# Patient Record
Sex: Male | Born: 1985 | Race: Black or African American | Hispanic: No | Marital: Single | Smoking: Former smoker
Health system: Southern US, Community
[De-identification: ages and names within clinical notes are randomized; demographics above are authoritative.]

## PROBLEM LIST (undated history)

## (undated) DIAGNOSIS — F32A Depression, unspecified: Secondary | ICD-10-CM

## (undated) DIAGNOSIS — R569 Unspecified convulsions: Secondary | ICD-10-CM

## (undated) DIAGNOSIS — R51 Headache: Secondary | ICD-10-CM

## (undated) DIAGNOSIS — W3400XA Accidental discharge from unspecified firearms or gun, initial encounter: Secondary | ICD-10-CM

## (undated) DIAGNOSIS — F329 Major depressive disorder, single episode, unspecified: Secondary | ICD-10-CM

## (undated) DIAGNOSIS — Z9289 Personal history of other medical treatment: Secondary | ICD-10-CM

## (undated) DIAGNOSIS — I10 Essential (primary) hypertension: Secondary | ICD-10-CM

## (undated) DIAGNOSIS — Z9109 Other allergy status, other than to drugs and biological substances: Secondary | ICD-10-CM

## (undated) DIAGNOSIS — Z8619 Personal history of other infectious and parasitic diseases: Secondary | ICD-10-CM

## (undated) DIAGNOSIS — Z9889 Other specified postprocedural states: Secondary | ICD-10-CM

## (undated) DIAGNOSIS — R519 Headache, unspecified: Secondary | ICD-10-CM

## (undated) DIAGNOSIS — Z8669 Personal history of other diseases of the nervous system and sense organs: Secondary | ICD-10-CM

## (undated) DIAGNOSIS — T7840XA Allergy, unspecified, initial encounter: Secondary | ICD-10-CM

## (undated) HISTORY — DX: Headache, unspecified: R51.9

## (undated) HISTORY — DX: Allergy, unspecified, initial encounter: T78.40XA

## (undated) HISTORY — DX: Headache: R51

## (undated) HISTORY — DX: Essential (primary) hypertension: I10

---
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
## 2010-08-24 ENCOUNTER — Emergency Department (HOSPITAL_BASED_OUTPATIENT_CLINIC_OR_DEPARTMENT_OTHER)
Admission: EM | Admit: 2010-08-24 | Discharge: 2010-08-24 | Payer: Self-pay | Source: Home / Self Care | Admitting: Emergency Medicine

## 2011-07-19 ENCOUNTER — Ambulatory Visit (INDEPENDENT_AMBULATORY_CARE_PROVIDER_SITE_OTHER): Payer: Managed Care, Other (non HMO)

## 2011-07-19 DIAGNOSIS — R509 Fever, unspecified: Secondary | ICD-10-CM

## 2011-07-19 DIAGNOSIS — J111 Influenza due to unidentified influenza virus with other respiratory manifestations: Secondary | ICD-10-CM

## 2011-12-02 ENCOUNTER — Emergency Department (INDEPENDENT_AMBULATORY_CARE_PROVIDER_SITE_OTHER)

## 2011-12-02 ENCOUNTER — Emergency Department (HOSPITAL_BASED_OUTPATIENT_CLINIC_OR_DEPARTMENT_OTHER)
Admission: EM | Admit: 2011-12-02 | Discharge: 2011-12-02 | Disposition: A | Discharge status: Home / Self Care | Attending: Emergency Medicine | Admitting: Emergency Medicine

## 2011-12-02 ENCOUNTER — Encounter (HOSPITAL_BASED_OUTPATIENT_CLINIC_OR_DEPARTMENT_OTHER): Payer: Self-pay | Admitting: *Deleted

## 2011-12-02 DIAGNOSIS — W208XXA Other cause of strike by thrown, projected or falling object, initial encounter: Secondary | ICD-10-CM

## 2011-12-02 DIAGNOSIS — M79609 Pain in unspecified limb: Secondary | ICD-10-CM | POA: Insufficient documentation

## 2011-12-02 DIAGNOSIS — Y9269 Other specified industrial and construction area as the place of occurrence of the external cause: Secondary | ICD-10-CM | POA: Insufficient documentation

## 2011-12-02 DIAGNOSIS — IMO0002 Reserved for concepts with insufficient information to code with codable children: Secondary | ICD-10-CM | POA: Insufficient documentation

## 2011-12-02 DIAGNOSIS — S8990XA Unspecified injury of unspecified lower leg, initial encounter: Secondary | ICD-10-CM

## 2011-12-02 DIAGNOSIS — S9030XA Contusion of unspecified foot, initial encounter: Secondary | ICD-10-CM | POA: Insufficient documentation

## 2011-12-02 MED ORDER — IBUPROFEN 600 MG PO TABS: 600.0000 mg | ORAL_TABLET | Freq: Four times a day (QID) | ORAL | Status: AC | PRN

## 2011-12-02 MED ORDER — IBUPROFEN 400 MG PO TABS
600.0000 mg | ORAL_TABLET | Freq: Once | ORAL | Status: AC
  Administered 2011-12-02: 600 mg via ORAL
  Filled 2011-12-02: qty 1

## 2011-12-02 MED ORDER — TRAMADOL HCL 50 MG PO TABS: 50.0000 mg | ORAL_TABLET | Freq: Four times a day (QID) | ORAL | Status: AC | PRN

## 2011-12-02 NOTE — ED Notes (Signed)
Pt. Did not want a w/c
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
## 2011-12-02 NOTE — ED Provider Notes (Signed)
History     CSN: [id number]  Arrival date & time 12/02/11  0008   First MD Initiated Contact with Patient 12/02/11 0013      Chief Complaint  Patient presents with  . Foot Injury    Pt. reports a metal pole fell on to the R great toe causing injury and pain approx. 1 hr ago    (Consider location/radiation/quality/duration/timing/severity/associated sxs/prior treatment) HPI Pt states metal pole weighing > 50lbs fell onto L great toe 1 hour ago while at work. No other injury. No open injuries. Pt is ambulatory.  History reviewed. No pertinent past medical history.  History reviewed. No pertinent past surgical history.  No family history on file.  History  Substance Use Topics  . Smoking status: Passive Smoker    Types: Cigars  . Smokeless tobacco: Not on file  . Alcohol Use: Yes      Review of Systems  Skin: Positive for wound.  Neurological: Negative for weakness and numbness.    Allergies  Review of patient's allergies indicates not on file.  Home Medications   Current Outpatient Rx  Name Route Sig Dispense Refill  . IBUPROFEN 600 MG PO TABS Oral Take 1 tablet (600 mg total) by mouth every 6 (six) hours as needed for pain. 30 tablet 0  . TRAMADOL HCL 50 MG PO TABS Oral Take 1 tablet (50 mg total) by mouth every 6 (six) hours as needed for pain. 15 tablet 0    BP 119/82  Pulse 60  Temp(Src) 97.6 F (36.4 C) (Oral)  Resp 17  Ht 6' (1.829 m)  Wt 198 lb (89.812 kg)  BMI 26.85 kg/m2  SpO2 100%  Physical Exam  Constitutional: He is oriented to person, place, and time. He appears well-developed and well-nourished.  HENT:  Head: Normocephalic and atraumatic.  Neck: Normal range of motion. Neck supple.  Pulmonary/Chest: Effort normal.  Abdominal: Soft.  Musculoskeletal:       L foot: DP 2+. Sensation/motor intact throughout. TTP over tip of 1 st digit and 2nd digit. No deformity. Contusions noted.   Neurological: He is alert and oriented to person, place,  and time.  Skin: Skin is warm and dry.  Psychiatric: He has a normal mood and affect. His behavior is normal.    ED Course  Procedures (including critical care time)  Labs Reviewed - No data to display No results found.   1. Foot contusion       MDM          Loren Racer, MD 12/02/11 [phone]

## 2011-12-02 NOTE — Discharge Instructions (Signed)
Contusion (Bruise) of Foot Injury to the foot causes bruises (contusions). Contusions are caused by bleeding from small blood vessels that allow blood to leak out into the muscles, cord-like structures that attach muscle to bone (tendons), and/or other soft tissue.  CAUSES  Contusions of the foot are common. Bruises are frequently seen from:  Contact sports injuries.   The use of medications that thin the blood (anti-coagulants).   Aspirin and non-steroidal anti-inflammatory agents that decrease the clotting ability.   People with vitamin deficiencies.  SYMPTOMS  Signs of foot injury include pain and swelling. At first there may be discoloration from blood under the skin. This will appear blue to purple in color. As the bruise ages, the color turns yellow. Swelling may limit the movement of the toes.  Complications from foot injury may include:  Collections of blood leading to disability if calcium deposits form. These can later limit movement in the foot.   Infection of the foot if there are breaks in the skin.   Rupture of the tendons that may need surgical repair.  DIAGNOSIS  Diagnosing foot injuries can be made by observation. If problems continue, X-rays may be needed to make sure there are no broken bones (fractures). Continuing problems may require physical therapy.  HOME CARE INSTRUCTIONS   Apply ice to the injury for 15 to 20 minutes, 3 to 4 times per day. Put the ice in a plastic bag and place a towel between the bag of ice and your skin.   An elastic wrap (like an Ace bandage) may be used to keep swelling down.   Keep foot elevated to reduce swelling and discomfort.   Try to avoid standing or walking while the foot is painful. Do not resume use until instructed by your caregiver. Then begin use gradually. If pain develops, decrease use and continue the above measures. Gradually increase activities that do not cause discomfort until you slowly have normal use.   Only take  over-the-counter or prescription medicines for pain, discomfort, or fever as directed by your caregiver. Use only if your caregiver has not given medications that would interfere.   Begin daily rehabilitation exercises when supportive wrapping is no longer needed.   Use ice massage for 10 minutes before and after workouts. Fill a large styrofoam cup with water and freeze. Tear a small amount of foam from the top so ice protrudes. Massage ice firmly over the injured area in a circle about the size of a softball.   Always eat a well balanced diet.   Follow all instructions for follow up with your caregiver, any orthopedic referrals, physical therapy and rehabilitation. Any delay in obtaining necessary care could result in delayed healing, and temporary or permanent disability.  SEEK IMMEDIATE MEDICAL CARE IF:   Your pain and swelling increase, or pain is uncontrolled with medications.   You have loss of feeling in your foot, or your foot turns cold or blue.   An oral temperature above 102 F (38.9 C) develops, not controlled by medication.   Your foot becomes warm to touch, or you have more pain with movement of your toes.   You have a foot contusion that does not improve in 1 or 2 days.   Skin is broken and signs of infection occur (drainage, increasing pain, fever, headache, muscle aches, dizziness or a general ill feeling).   You develop new, unexplained symptoms, or an increase of the symptoms that brought you to your caregiver.  MAKE SURE YOU:     Understand these instructions.   Will watch your condition.   Will get help right away if you are not doing well or get worse.  Document Released: 05/16/2006 Document Revised: 07/14/2011 Document Reviewed: 06/28/2011 [employer] Patient Information 2012 [employer]. 

## 2012-06-05 ENCOUNTER — Emergency Department (HOSPITAL_BASED_OUTPATIENT_CLINIC_OR_DEPARTMENT_OTHER): Payer: 59

## 2012-06-05 ENCOUNTER — Encounter (HOSPITAL_BASED_OUTPATIENT_CLINIC_OR_DEPARTMENT_OTHER): Payer: Self-pay | Admitting: Emergency Medicine

## 2012-06-05 DIAGNOSIS — S59909A Unspecified injury of unspecified elbow, initial encounter: Secondary | ICD-10-CM | POA: Insufficient documentation

## 2012-06-05 DIAGNOSIS — Y9389 Activity, other specified: Secondary | ICD-10-CM | POA: Insufficient documentation

## 2012-06-05 DIAGNOSIS — S6990XA Unspecified injury of unspecified wrist, hand and finger(s), initial encounter: Secondary | ICD-10-CM | POA: Insufficient documentation

## 2012-06-05 DIAGNOSIS — Y929 Unspecified place or not applicable: Secondary | ICD-10-CM | POA: Insufficient documentation

## 2012-06-05 DIAGNOSIS — W2209XA Striking against other stationary object, initial encounter: Secondary | ICD-10-CM | POA: Insufficient documentation

## 2012-06-05 DIAGNOSIS — Z87891 Personal history of nicotine dependence: Secondary | ICD-10-CM | POA: Insufficient documentation
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
## 2012-06-06 ENCOUNTER — Emergency Department (HOSPITAL_BASED_OUTPATIENT_CLINIC_OR_DEPARTMENT_OTHER)
Admission: EM | Admit: 2012-06-06 | Discharge: 2012-06-06 | Discharge status: Against Medical Advice | Payer: 59 | Attending: Emergency Medicine | Admitting: Emergency Medicine

## 2012-06-06 NOTE — ED Notes (Signed)
Pt called x 2 with no answer  

## 2012-09-14 ENCOUNTER — Emergency Department (HOSPITAL_BASED_OUTPATIENT_CLINIC_OR_DEPARTMENT_OTHER)
Admission: EM | Admit: 2012-09-14 | Discharge: 2012-09-15 | Disposition: A | Discharge status: Home / Self Care | Payer: Self-pay | Attending: Emergency Medicine | Admitting: Emergency Medicine

## 2012-09-14 ENCOUNTER — Encounter (HOSPITAL_BASED_OUTPATIENT_CLINIC_OR_DEPARTMENT_OTHER): Payer: Self-pay

## 2012-09-14 ENCOUNTER — Emergency Department (HOSPITAL_BASED_OUTPATIENT_CLINIC_OR_DEPARTMENT_OTHER): Payer: Self-pay

## 2012-09-14 DIAGNOSIS — R5381 Other malaise: Secondary | ICD-10-CM | POA: Insufficient documentation

## 2012-09-14 DIAGNOSIS — I4902 Ventricular flutter: Secondary | ICD-10-CM | POA: Insufficient documentation

## 2012-09-14 DIAGNOSIS — F17200 Nicotine dependence, unspecified, uncomplicated: Secondary | ICD-10-CM | POA: Insufficient documentation

## 2012-09-14 DIAGNOSIS — R002 Palpitations: Secondary | ICD-10-CM | POA: Insufficient documentation

## 2012-09-14 LAB — CBC WITH DIFFERENTIAL/PLATELET
Basophils Absolute: 0 10*3/uL (ref 0.0–0.1)
Basophils Relative: 0 % (ref 0–1)
Eosinophils Absolute: 0.2 10*3/uL (ref 0.0–0.7)
Eosinophils Relative: 3 % (ref 0–5)
MCH: 28.5 pg (ref 26.0–34.0)
MCHC: 34.4 g/dL (ref 30.0–36.0)
Neutrophils Relative %: 43 % (ref 43–77)
Platelets: 227 10*3/uL (ref 150–400)
RBC: 5.12 MIL/uL (ref 4.22–5.81)
RDW: 12.8 % (ref 11.5–15.5)

## 2012-09-14 NOTE — ED Notes (Signed)
Pt. denies CP, n/v.  States he feels tired.  Has never had sx. like this before.

## 2012-09-14 NOTE — ED Provider Notes (Signed)
History  This chart was scribed for Hanley Seamen, MD by Bennett Scrape, ED Scribe. This patient was seen in room MH09/MH09 and the patient's care was started at 11:28 PM.  CSN: [id number]  Arrival date & time 09/14/12  2244   First MD Initiated Contact with Patient 09/14/12 2324      Chief Complaint  Patient presents with  . Irregular Heart Beat    The history is provided by the patient. No language interpreter was used.    ,Dakota Evans is a 27 y.o. male who presents to the Emergency Department complaining of intermittent episodes of premature ventricular contractions described as "fluttering" that last a few seconds at a time that he noticed 6 days ago. He states that the episodes can be 5 to 20 minutes apart. He denies any known triggers for the symptoms. He states that he is here for evaluation due to the worsening of symptoms and feeling fatigued at work today. He denies having prior episodes of similar symptoms. He denies having CP, SOB, diaphroeis or nausea as associated symptoms. He does not have a h/o chronic medical conditions and is a current everyday smoker and alcohol user.  History reviewed. No pertinent past medical history.  Past Surgical History  Procedure Date  . Dental surgery     No family history on file.  History  Substance Use Topics  . Smoking status: Current Some Day Smoker  . Smokeless tobacco: Not on file  . Alcohol Use: Yes     Comment: socially     Review of Systems  A complete 10 system review of systems was obtained and all systems are negative except as noted in the HPI and PMH.   Allergies  Review of patient's allergies indicates no known allergies.  Home Medications  No current outpatient prescriptions on file.  Triage Vitals: BP 140/97  Pulse 82  Temp 98.3 F (36.8 C) (Oral)  Resp 16  Ht 6' (1.829 m)  Wt 220 lb (99.791 kg)  BMI 29.84 kg/m2  SpO2 100%  Physical Exam  Nursing note and vitals reviewed. Constitutional: He is  oriented to person, place, and time. He appears well-developed and well-nourished. No distress.  HENT:  Head: Normocephalic and atraumatic.  Mouth/Throat: Oropharynx is clear and moist.  Eyes: Conjunctivae normal and EOM are normal. Pupils are equal, round, and reactive to light.  Neck: Normal range of motion. Neck supple. No tracheal deviation present. No thyromegaly present.  Cardiovascular: Normal rate, regular rhythm and intact distal pulses.   Pulmonary/Chest: Effort normal and breath sounds normal. No respiratory distress.  Abdominal: Soft. There is no tenderness.  Musculoskeletal: Normal range of motion. He exhibits no edema.  Neurological: He is alert and oriented to person, place, and time.  Skin: Skin is warm and dry.  Psychiatric: He has a normal mood and affect. His behavior is normal.    ED Course  Procedures (including critical care time)  DIAGNOSTIC STUDIES: Oxygen Saturation is 100% on room air, normal by my interpretation.    COORDINATION OF CARE: 11:28 PM- Advised pt of my diagnosis as premature ventricular contractions and advised him that this is not life threatening and will resolve on its own in time. Discussed treatment plan which includes f/u with cardiologist.     MDM   Nursing notes and vitals signs, including pulse oximetry, reviewed.  Summary of this visit's results, reviewed by myself:  Labs:  Results for orders placed during the hospital encounter of 09/14/12 (from the past  24 hour(s))  CBC WITH DIFFERENTIAL     Status: None   Collection Time    09/14/12 11:33 PM      Result Value Range   WBC 6.4  4.0 - 10.5 K/uL   RBC 5.12  4.22 - 5.81 MIL/uL   Hemoglobin 14.6  13.0 - 17.0 g/dL   HCT 46.9  62.9 - 52.8 %   MCV 83.0  78.0 - 100.0 fL   MCH 28.5  26.0 - 34.0 pg   MCHC 34.4  30.0 - 36.0 g/dL   RDW 41.3  24.4 - 01.0 %   Platelets 227  150 - 400 K/uL   Neutrophils Relative 43  43 - 77 %   Neutro Abs 2.8  1.7 - 7.7 K/uL   Lymphocytes Relative  43  12 - 46 %   Lymphs Abs 2.8  0.7 - 4.0 K/uL   Monocytes Relative 11  3 - 12 %   Monocytes Absolute 0.7  0.1 - 1.0 K/uL   Eosinophils Relative 3  0 - 5 %   Eosinophils Absolute 0.2  0.0 - 0.7 K/uL   Basophils Relative 0  0 - 1 %   Basophils Absolute 0.0  0.0 - 0.1 K/uL  BASIC METABOLIC PANEL     Status: None   Collection Time    09/14/12 11:33 PM      Result Value Range   Sodium 140  135 - 145 mEq/L   Potassium 3.6  3.5 - 5.1 mEq/L   Chloride 100  96 - 112 mEq/L   CO2 29  19 - 32 mEq/L   Glucose, Bld 97  70 - 99 mg/dL   BUN 7  6 - 23 mg/dL   Creatinine, Ser 2.72  0.50 - 1.35 mg/dL   Calcium 9.5  8.4 - 53.6 mg/dL   GFR calc non Af Amer >90  >90 mL/min   GFR calc Af Amer >90  >90 mL/min  TROPONIN I     Status: None   Collection Time    09/14/12 11:33 PM      Result Value Range   Troponin I <0.30  <0.30 ng/mL    Imaging Studies: Dg Chest 2 View  09/14/2012  *RADIOLOGY REPORT*  Clinical Data: Irregular heart beat.  Heart fluttering.  CHEST - 2 VIEW  Comparison: None.  Findings: The heart size and pulmonary vascularity are normal. The lungs appear clear and expanded without focal air space disease or consolidation. No blunting of the costophrenic angles.  No pneumothorax.  Mediastinal contours appear intact.  IMPRESSION: No evidence of active pulmonary disease.   Original Report Authenticated By: Burman Nieves, M.D.       EKG Interpretation:  Date & Time: 09/14/2012 10:54 PM  Rate: 75  Rhythm: normal sinus rhythm  QRS Axis: right  Intervals: normal  ST/T Wave abnormalities: normal  Conduction Disutrbances:none  Narrative Interpretation:   Old EKG Reviewed: none available  History consistent with PVCs but no PVCs were seen during the patient's stay in the ED. Will refer to cardiology for likely Holter monitoring.     I personally performed the services described in this documentation, which was scribed in my presence.  The recorded information has been reviewed is  accurate.     Hanley Seamen, MD 09/15/12 [phone]

## 2012-09-14 NOTE — ED Notes (Signed)
Complains of irregular heart beat.  States "it goes normal then feels like chest is fluttering"  States "feels like he ran around the block"

## 2012-09-14 NOTE — ED Notes (Signed)
MD at bedside. 

## 2012-09-15 LAB — TSH: TSH: 2.721 u[IU]/mL (ref 0.350–4.500)

## 2012-09-15 LAB — BASIC METABOLIC PANEL
Calcium: 9.5 mg/dL (ref 8.4–10.5)
GFR calc Af Amer: >90 mL/min (ref >90)
GFR calc non Af Amer: >90 mL/min (ref >90)
Potassium: 3.6 mEq/L (ref 3.5–5.1)
Sodium: 140 mEq/L (ref 135–145)

## 2013-01-17 ENCOUNTER — Inpatient Hospital Stay (HOSPITAL_COMMUNITY): Payer: 59

## 2013-01-17 ENCOUNTER — Emergency Department (HOSPITAL_COMMUNITY): Payer: 59

## 2013-01-17 ENCOUNTER — Encounter (HOSPITAL_COMMUNITY): Admission: EM | Disposition: A | Discharge status: Rehabilitation Facility | Payer: Self-pay | Source: Home / Self Care

## 2013-01-17 ENCOUNTER — Encounter (HOSPITAL_COMMUNITY): Payer: Self-pay | Admitting: Anesthesiology

## 2013-01-17 ENCOUNTER — Emergency Department (HOSPITAL_COMMUNITY): Payer: 59 | Admitting: Anesthesiology

## 2013-01-17 ENCOUNTER — Encounter (HOSPITAL_COMMUNITY): Payer: Self-pay | Admitting: Radiology

## 2013-01-17 ENCOUNTER — Inpatient Hospital Stay (HOSPITAL_COMMUNITY)
Admission: EM | Admit: 2013-01-17 | Discharge: 2013-02-12 | DRG: 957 | Disposition: A | Discharge status: Rehabilitation Facility | Payer: 59 | Attending: General Surgery | Admitting: General Surgery

## 2013-01-17 DIAGNOSIS — F17200 Nicotine dependence, unspecified, uncomplicated: Secondary | ICD-10-CM | POA: Diagnosis present

## 2013-01-17 DIAGNOSIS — R4701 Aphasia: Secondary | ICD-10-CM | POA: Diagnosis present

## 2013-01-17 DIAGNOSIS — S36499A Other injury of unspecified part of small intestine, initial encounter: Secondary | ICD-10-CM | POA: Diagnosis present

## 2013-01-17 DIAGNOSIS — Y833 Surgical operation with formation of external stoma as the cause of abnormal reaction of the patient, or of later complication, without mention of misadventure at the time of the procedure: Secondary | ICD-10-CM | POA: Diagnosis not present

## 2013-01-17 DIAGNOSIS — S3669XA Other injury of rectum, initial encounter: Secondary | ICD-10-CM | POA: Diagnosis present

## 2013-01-17 DIAGNOSIS — N170 Acute kidney failure with tubular necrosis: Secondary | ICD-10-CM | POA: Diagnosis not present

## 2013-01-17 DIAGNOSIS — D72829 Elevated white blood cell count, unspecified: Secondary | ICD-10-CM | POA: Diagnosis present

## 2013-01-17 DIAGNOSIS — S066X9A Traumatic subarachnoid hemorrhage with loss of consciousness of unspecified duration, initial encounter: Secondary | ICD-10-CM

## 2013-01-17 DIAGNOSIS — S21309A Unspecified open wound of unspecified front wall of thorax with penetration into thoracic cavity, initial encounter: Secondary | ICD-10-CM

## 2013-01-17 DIAGNOSIS — IMO0002 Reserved for concepts with insufficient information to code with codable children: Secondary | ICD-10-CM | POA: Diagnosis not present

## 2013-01-17 DIAGNOSIS — S31139A Puncture wound of abdominal wall without foreign body, unspecified quadrant without penetration into peritoneal cavity, initial encounter: Secondary | ICD-10-CM

## 2013-01-17 DIAGNOSIS — S27809A Unspecified injury of diaphragm, initial encounter: Secondary | ICD-10-CM

## 2013-01-17 DIAGNOSIS — S36116A Major laceration of liver, initial encounter: Secondary | ICD-10-CM

## 2013-01-17 DIAGNOSIS — D62 Acute posthemorrhagic anemia: Secondary | ICD-10-CM | POA: Diagnosis not present

## 2013-01-17 DIAGNOSIS — K661 Hemoperitoneum: Secondary | ICD-10-CM | POA: Diagnosis present

## 2013-01-17 DIAGNOSIS — S066X0A Traumatic subarachnoid hemorrhage without loss of consciousness, initial encounter: Secondary | ICD-10-CM | POA: Diagnosis present

## 2013-01-17 DIAGNOSIS — S36599A Other injury of unspecified part of colon, initial encounter: Secondary | ICD-10-CM | POA: Diagnosis present

## 2013-01-17 DIAGNOSIS — S0193XA Puncture wound without foreign body of unspecified part of head, initial encounter: Secondary | ICD-10-CM

## 2013-01-17 DIAGNOSIS — S065X9A Traumatic subdural hemorrhage with loss of consciousness of unspecified duration, initial encounter: Secondary | ICD-10-CM

## 2013-01-17 DIAGNOSIS — S36529A Contusion of unspecified part of colon, initial encounter: Secondary | ICD-10-CM

## 2013-01-17 DIAGNOSIS — J90 Pleural effusion, not elsewhere classified: Secondary | ICD-10-CM | POA: Diagnosis not present

## 2013-01-17 DIAGNOSIS — A419 Sepsis, unspecified organism: Secondary | ICD-10-CM | POA: Diagnosis not present

## 2013-01-17 DIAGNOSIS — J95821 Acute postprocedural respiratory failure: Secondary | ICD-10-CM | POA: Diagnosis present

## 2013-01-17 DIAGNOSIS — S0100XA Unspecified open wound of scalp, initial encounter: Secondary | ICD-10-CM | POA: Diagnosis present

## 2013-01-17 DIAGNOSIS — K659 Peritonitis, unspecified: Secondary | ICD-10-CM | POA: Diagnosis not present

## 2013-01-17 DIAGNOSIS — W3400XA Accidental discharge from unspecified firearms or gun, initial encounter: Secondary | ICD-10-CM

## 2013-01-17 DIAGNOSIS — S0190XA Unspecified open wound of unspecified part of head, initial encounter: Secondary | ICD-10-CM | POA: Diagnosis present

## 2013-01-17 DIAGNOSIS — T8140XA Infection following a procedure, unspecified, initial encounter: Secondary | ICD-10-CM | POA: Diagnosis not present

## 2013-01-17 DIAGNOSIS — I498 Other specified cardiac arrhythmias: Secondary | ICD-10-CM | POA: Diagnosis not present

## 2013-01-17 DIAGNOSIS — S36119A Unspecified injury of liver, initial encounter: Secondary | ICD-10-CM

## 2013-01-17 DIAGNOSIS — S066XAA Traumatic subarachnoid hemorrhage with loss of consciousness status unknown, initial encounter: Secondary | ICD-10-CM

## 2013-01-17 DIAGNOSIS — Y838 Other surgical procedures as the cause of abnormal reaction of the patient, or of later complication, without mention of misadventure at the time of the procedure: Secondary | ICD-10-CM | POA: Diagnosis not present

## 2013-01-17 DIAGNOSIS — S36529S Contusion of unspecified part of colon, sequela: Secondary | ICD-10-CM

## 2013-01-17 DIAGNOSIS — K631 Perforation of intestine (nontraumatic): Secondary | ICD-10-CM | POA: Diagnosis not present

## 2013-01-17 DIAGNOSIS — R6520 Severe sepsis without septic shock: Secondary | ICD-10-CM | POA: Diagnosis not present

## 2013-01-17 DIAGNOSIS — S36509A Unspecified injury of unspecified part of colon, initial encounter: Secondary | ICD-10-CM

## 2013-01-17 DIAGNOSIS — S065XAA Traumatic subdural hemorrhage with loss of consciousness status unknown, initial encounter: Secondary | ICD-10-CM

## 2013-01-17 DIAGNOSIS — J189 Pneumonia, unspecified organism: Secondary | ICD-10-CM | POA: Diagnosis not present

## 2013-01-17 DIAGNOSIS — Z113 Encounter for screening for infections with a predominantly sexual mode of transmission: Secondary | ICD-10-CM

## 2013-01-17 DIAGNOSIS — S27809D Unspecified injury of diaphragm, subsequent encounter: Secondary | ICD-10-CM

## 2013-01-17 DIAGNOSIS — J9601 Acute respiratory failure with hypoxia: Secondary | ICD-10-CM | POA: Diagnosis present

## 2013-01-17 DIAGNOSIS — G8911 Acute pain due to trauma: Secondary | ICD-10-CM

## 2013-01-17 DIAGNOSIS — S06369A Traumatic hemorrhage of cerebrum, unspecified, with loss of consciousness of unspecified duration, initial encounter: Secondary | ICD-10-CM

## 2013-01-17 DIAGNOSIS — R188 Other ascites: Secondary | ICD-10-CM | POA: Diagnosis not present

## 2013-01-17 DIAGNOSIS — S0636AA Traumatic hemorrhage of cerebrum, unspecified, with loss of consciousness status unknown, initial encounter: Secondary | ICD-10-CM

## 2013-01-17 DIAGNOSIS — Z1810 Retained metal fragments, unspecified: Secondary | ICD-10-CM | POA: Diagnosis present

## 2013-01-17 DIAGNOSIS — R509 Fever, unspecified: Secondary | ICD-10-CM | POA: Diagnosis not present

## 2013-01-17 DIAGNOSIS — T794XXA Traumatic shock, initial encounter: Secondary | ICD-10-CM | POA: Diagnosis present

## 2013-01-17 HISTORY — PX: LAPAROTOMY: SHX154

## 2013-01-17 HISTORY — DX: Other allergy status, other than to drugs and biological substances: Z91.09

## 2013-01-17 LAB — CK TOTAL AND CKMB (NOT AT ARMC): CK, MB: 5.5 ng/mL — ABNORMAL HIGH (ref 0.3–4.0)

## 2013-01-17 LAB — BLOOD GAS, ARTERIAL
Drawn by: 331001
FIO2: 0.6 %
PEEP: 5 cmH2O
RATE: 20 resp/min
pCO2 arterial: 36.6 mmHg (ref 35.0–45.0)
pH, Arterial: 7.379 (ref 7.350–7.450)
pO2, Arterial: 170 mmHg — ABNORMAL HIGH (ref 80.0–100.0)

## 2013-01-17 LAB — COMPREHENSIVE METABOLIC PANEL
Albumin: 3.5 g/dL (ref 3.5–5.2)
Alkaline Phosphatase: 91 U/L (ref 39–117)
BUN: 10 mg/dL (ref 6–23)
Chloride: 104 mEq/L (ref 96–112)
Glucose, Bld: 137 mg/dL — ABNORMAL HIGH (ref 70–99)
Potassium: 2.8 mEq/L — ABNORMAL LOW (ref 3.5–5.1)
Total Bilirubin: 0.1 mg/dL — ABNORMAL LOW (ref 0.3–1.2)

## 2013-01-17 LAB — CBC
HCT: 32.8 % — ABNORMAL LOW (ref 39.0–52.0)
Hemoglobin: 11.6 g/dL — ABNORMAL LOW (ref 13.0–17.0)
MCHC: 35.4 g/dL (ref 30.0–36.0)
MCV: 81.6 fL (ref 78.0–100.0)
Platelets: 169 10*3/uL (ref 150–400)
RDW: 13.1 % (ref 11.5–15.5)
RDW: 13.2 % (ref 11.5–15.5)
WBC: 6.2 10*3/uL (ref 4.0–10.5)
WBC: 7.4 10*3/uL (ref 4.0–10.5)

## 2013-01-17 LAB — URINALYSIS, ROUTINE W REFLEX MICROSCOPIC
Leukocytes, UA: NEGATIVE
Nitrite: NEGATIVE
Nitrite: NEGATIVE
Protein, ur: NEGATIVE mg/dL
Specific Gravity, Urine: 1.006 (ref 1.005–1.030)
Specific Gravity, Urine: 1.011 (ref 1.005–1.030)
Urobilinogen, UA: 0.2 mg/dL (ref 0.0–1.0)
pH: 6 (ref 5.0–8.0)

## 2013-01-17 LAB — URINE MICROSCOPIC-ADD ON

## 2013-01-17 LAB — POCT I-STAT, CHEM 8
Glucose, Bld: 135 mg/dL — ABNORMAL HIGH (ref 70–99)
HCT: 39 % (ref 39.0–52.0)
Hemoglobin: 13.3 g/dL (ref 13.0–17.0)
Potassium: 2.7 mEq/L — CL (ref 3.5–5.1)
Sodium: 142 mEq/L (ref 135–145)
TCO2: 18 mmol/L (ref 0–100)

## 2013-01-17 LAB — PROTIME-INR
INR: 1.17 (ref 0.00–1.49)
Prothrombin Time: 14.5 seconds (ref 11.6–15.2)
Prothrombin Time: 14.7 seconds (ref 11.6–15.2)

## 2013-01-17 LAB — CBC WITH DIFFERENTIAL/PLATELET
Basophils Relative: 0 % (ref 0–1)
Eosinophils Absolute: 0.3 10*3/uL (ref 0.0–0.7)
Eosinophils Relative: 3 % (ref 0–5)
Hemoglobin: 13.2 g/dL (ref 13.0–17.0)
MCH: 28.8 pg (ref 26.0–34.0)
MCHC: 35.3 g/dL (ref 30.0–36.0)
Monocytes Absolute: 0.9 10*3/uL (ref 0.1–1.0)
Monocytes Relative: 9 % (ref 3–12)
Neutrophils Relative %: 45 % (ref 43–77)

## 2013-01-17 LAB — BASIC METABOLIC PANEL
Chloride: 108 mEq/L (ref 96–112)
GFR calc Af Amer: >90 mL/min (ref >90)
Potassium: 3.5 mEq/L (ref 3.5–5.1)
Sodium: 140 mEq/L (ref 135–145)

## 2013-01-17 LAB — ABO/RH: ABO/RH(D): O POS

## 2013-01-17 LAB — TROPONIN I: Troponin I: <0.3 ng/mL (ref <0.30)

## 2013-01-17 LAB — CG4 I-STAT (LACTIC ACID): Lactic Acid, Venous: 7.8 mmol/L — ABNORMAL HIGH (ref 0.5–2.2)

## 2013-01-17 SURGERY — LAPAROTOMY, EXPLORATORY
Anesthesia: General | Site: Abdomen | Wound class: Dirty or Infected

## 2013-01-17 MED ORDER — TRANEXAMIC ACID 100 MG/ML IV SOLN
2500.0000 mg | INTRAVENOUS | Status: DC | PRN
  Administered 2013-01-17: 7.5 mg/kg/h via INTRAVENOUS

## 2013-01-17 MED ORDER — POVIDONE-IODINE 10 % EX OINT
TOPICAL_OINTMENT | CUTANEOUS | Status: DC | PRN
  Administered 2013-01-17: 1 via TOPICAL

## 2013-01-17 MED ORDER — LIDOCAINE HCL (CARDIAC) 20 MG/ML IV SOLN
INTRAVENOUS | Status: DC | PRN
  Administered 2013-01-17: 100 mg via INTRAVENOUS

## 2013-01-17 MED ORDER — PANTOPRAZOLE SODIUM 40 MG IV SOLR
40.0000 mg | Freq: Every day | INTRAVENOUS | Status: DC
  Administered 2013-01-17 – 2013-02-03 (×16): 40 mg via INTRAVENOUS
  Filled 2013-01-17 (×22): qty 40

## 2013-01-17 MED ORDER — SODIUM CHLORIDE 0.9 % IV SOLN
25.0000 ug/h | INTRAVENOUS | Status: DC
  Administered 2013-01-17: 50 ug/h via INTRAVENOUS
  Administered 2013-01-17: 100 ug/h via INTRAVENOUS
  Filled 2013-01-17 (×2): qty 50

## 2013-01-17 MED ORDER — PROPOFOL 10 MG/ML IV EMUL
5.0000 ug/kg/min | INTRAVENOUS | Status: DC
  Administered 2013-01-17: 25 ug/kg/min via INTRAVENOUS
  Administered 2013-01-17: 30 ug/kg/min via INTRAVENOUS
  Administered 2013-01-17 (×2): 50 ug/kg/min via INTRAVENOUS
  Administered 2013-01-18 (×2): 25 ug/kg/min via INTRAVENOUS
  Filled 2013-01-17 (×6): qty 100

## 2013-01-17 MED ORDER — MIDAZOLAM HCL 5 MG/5ML IJ SOLN
INTRAMUSCULAR | Status: AC | PRN
  Administered 2013-01-17 (×2): 4 mg via INTRAVENOUS

## 2013-01-17 MED ORDER — SODIUM CHLORIDE 0.9 % IV SOLN
1.0000 g | Freq: Once | INTRAVENOUS | Status: AC
  Administered 2013-01-17: 1 g via INTRAVENOUS
  Filled 2013-01-17: qty 10

## 2013-01-17 MED ORDER — PANTOPRAZOLE SODIUM 40 MG PO TBEC
40.0000 mg | DELAYED_RELEASE_TABLET | Freq: Every day | ORAL | Status: DC
  Administered 2013-01-23 – 2013-02-11 (×9): 40 mg via ORAL
  Filled 2013-01-17 (×9): qty 1

## 2013-01-17 MED ORDER — ARTIFICIAL TEARS OP OINT
TOPICAL_OINTMENT | OPHTHALMIC | Status: DC | PRN
  Administered 2013-01-17: 1 via OPHTHALMIC

## 2013-01-17 MED ORDER — LACTATED RINGERS IV SOLN
INTRAVENOUS | Status: DC | PRN
  Administered 2013-01-17: 03:00:00 via INTRAVENOUS

## 2013-01-17 MED ORDER — HEMOSTATIC AGENTS (NO CHARGE) OPTIME
TOPICAL | Status: DC | PRN
  Administered 2013-01-17 (×2): 1 via TOPICAL

## 2013-01-17 MED ORDER — 0.9 % SODIUM CHLORIDE (POUR BTL) OPTIME
TOPICAL | Status: DC | PRN
  Administered 2013-01-17 (×2): 1000 mL

## 2013-01-17 MED ORDER — BIOTENE DRY MOUTH MT LIQD
15.0000 mL | Freq: Four times a day (QID) | OROMUCOSAL | Status: DC
  Administered 2013-01-17 – 2013-01-21 (×16): 15 mL via OROMUCOSAL

## 2013-01-17 MED ORDER — PROPOFOL 10 MG/ML IV EMUL
5.0000 ug/kg/min | INTRAVENOUS | Status: DC
  Administered 2013-01-17: 25 ug/kg/min via INTRAVENOUS

## 2013-01-17 MED ORDER — THROMBIN 20000 UNITS EX KIT
PACK | CUTANEOUS | Status: AC
  Filled 2013-01-17: qty 1

## 2013-01-17 MED ORDER — DEXTROSE 5 % IV SOLN
INTRAVENOUS | Status: DC | PRN
  Administered 2013-01-17: 03:00:00 via INTRAVENOUS

## 2013-01-17 MED ORDER — VECURONIUM BROMIDE 10 MG IV SOLR
INTRAVENOUS | Status: DC | PRN
  Administered 2013-01-17 (×2): 10 mg via INTRAVENOUS

## 2013-01-17 MED ORDER — SODIUM CHLORIDE 0.9 % IV SOLN
INTRAVENOUS | Status: DC | PRN
  Administered 2013-01-17: 03:00:00 via INTRAVENOUS

## 2013-01-17 MED ORDER — FENTANYL CITRATE 0.05 MG/ML IJ SOLN
INTRAMUSCULAR | Status: AC | PRN
  Administered 2013-01-17: 100 ug via INTRAVENOUS

## 2013-01-17 MED ORDER — CEFAZOLIN SODIUM-DEXTROSE 2-3 GM-% IV SOLR
INTRAVENOUS | Status: DC | PRN
  Administered 2013-01-17: 2 g via INTRAVENOUS

## 2013-01-17 MED ORDER — FENTANYL BOLUS VIA INFUSION
25.0000 ug | Freq: Four times a day (QID) | INTRAVENOUS | Status: DC | PRN
  Filled 2013-01-17: qty 100

## 2013-01-17 MED ORDER — ETOMIDATE 2 MG/ML IV SOLN
INTRAVENOUS | Status: AC | PRN
  Administered 2013-01-17: 20 mg via INTRAVENOUS

## 2013-01-17 MED ORDER — SUCCINYLCHOLINE CHLORIDE 20 MG/ML IJ SOLN
INTRAMUSCULAR | Status: AC | PRN
  Administered 2013-01-17 (×2): 100 mg via INTRAVENOUS

## 2013-01-17 MED ORDER — 0.9 % SODIUM CHLORIDE (POUR BTL) OPTIME
TOPICAL | Status: DC | PRN
  Administered 2013-01-17 (×4): 1000 mL

## 2013-01-17 MED ORDER — FENTANYL CITRATE 0.05 MG/ML IJ SOLN
INTRAMUSCULAR | Status: DC | PRN
  Administered 2013-01-17 (×4): 100 ug via INTRAVENOUS
  Administered 2013-01-17: 150 ug via INTRAVENOUS

## 2013-01-17 MED ORDER — TRANEXAMIC ACID 100 MG/ML IV SOLN
1000.0000 mg | Freq: Once | INTRAVENOUS | Status: AC
  Administered 2013-01-17: 1000 mg via INTRAVENOUS
  Filled 2013-01-17: qty 10

## 2013-01-17 MED ORDER — VECURONIUM BROMIDE 10 MG IV SOLR
INTRAVENOUS | Status: AC | PRN
  Administered 2013-01-17: 10 mg via INTRAVENOUS

## 2013-01-17 MED ORDER — TRANEXAMIC ACID 100 MG/ML IV SOLN
1000.0000 mg | Freq: Once | INTRAVENOUS | Status: DC
  Filled 2013-01-17: qty 10

## 2013-01-17 MED ORDER — METOPROLOL TARTRATE 1 MG/ML IV SOLN: 5.0000 mg | INTRAVENOUS | Status: DC | PRN

## 2013-01-17 MED ORDER — POVIDONE-IODINE 10 % EX OINT
TOPICAL_OINTMENT | CUTANEOUS | Status: AC
  Filled 2013-01-17: qty 56.7

## 2013-01-17 MED ORDER — PROPOFOL 10 MG/ML IV BOLUS
INTRAVENOUS | Status: DC | PRN
  Administered 2013-01-17 (×2): 100 mg via INTRAVENOUS

## 2013-01-17 MED ORDER — CHLORHEXIDINE GLUCONATE 0.12 % MT SOLN
15.0000 mL | Freq: Two times a day (BID) | OROMUCOSAL | Status: DC
  Administered 2013-01-17 – 2013-01-21 (×9): 15 mL via OROMUCOSAL
  Filled 2013-01-17 (×8): qty 15

## 2013-01-17 MED ORDER — ACETAMINOPHEN 650 MG RE SUPP
650.0000 mg | RECTAL | Status: DC | PRN
  Administered 2013-01-17 – 2013-01-21 (×8): 650 mg via RECTAL
  Filled 2013-01-17 (×8): qty 1

## 2013-01-17 MED ORDER — POTASSIUM CHLORIDE IN NACL 20-0.9 MEQ/L-% IV SOLN
INTRAVENOUS | Status: DC
  Administered 2013-01-17 – 2013-01-18 (×2): via INTRAVENOUS
  Administered 2013-01-18: 1000 mL via INTRAVENOUS
  Administered 2013-01-18 – 2013-01-20 (×5): via INTRAVENOUS
  Administered 2013-01-20: 1000 mL via INTRAVENOUS
  Administered 2013-01-21 – 2013-01-23 (×2): via INTRAVENOUS
  Filled 2013-01-17 (×17): qty 1000

## 2013-01-17 MED ORDER — PROPOFOL 10 MG/ML IV EMUL
INTRAVENOUS | Status: AC
  Filled 2013-01-17: qty 100

## 2013-01-17 SURGICAL SUPPLY — 54 items
BLADE SURG ROTATE 9660 (MISCELLANEOUS) ×2 IMPLANT
CANISTER SUCTION 2500CC (MISCELLANEOUS) IMPLANT
CATH TROCAR 32FR (CATHETERS) ×2 IMPLANT
CHLORAPREP W/TINT 26ML (MISCELLANEOUS) IMPLANT
CLOTH BEACON ORANGE TIMEOUT ST (SAFETY) ×2 IMPLANT
COVER SURGICAL LIGHT HANDLE (MISCELLANEOUS) ×2 IMPLANT
DRAPE LAPAROSCOPIC ABDOMINAL (DRAPES) ×2 IMPLANT
DRAPE UTILITY 15X26 W/TAPE STR (DRAPE) ×4 IMPLANT
DRAPE WARM FLUID 44X44 (DRAPE) ×2 IMPLANT
ELECT BLADE 6.5 EXT (BLADE) ×2 IMPLANT
ELECT CAUTERY BLADE 6.4 (BLADE) ×2 IMPLANT
ELECT REM PT RETURN 9FT ADLT (ELECTROSURGICAL) ×2
ELECTRODE REM PT RTRN 9FT ADLT (ELECTROSURGICAL) ×1 IMPLANT
EVACUATOR SILICONE 100CC (DRAIN) ×2 IMPLANT
GAUZE VASELINE 3X9 (GAUZE/BANDAGES/DRESSINGS) ×2 IMPLANT
GLOVE BIO SURGEON STRL SZ 6.5 (GLOVE) ×6 IMPLANT
GLOVE BIOGEL PI IND STRL 7.0 (GLOVE) ×2 IMPLANT
GLOVE BIOGEL PI IND STRL 8 (GLOVE) ×3 IMPLANT
GLOVE BIOGEL PI INDICATOR 7.0 (GLOVE) ×2
GLOVE BIOGEL PI INDICATOR 8 (GLOVE) ×3
GLOVE ECLIPSE 7.5 STRL STRAW (GLOVE) ×2 IMPLANT
GOWN STRL NON-REIN LRG LVL3 (GOWN DISPOSABLE) ×2 IMPLANT
GOWN STRL REIN 2XL XLG LVL4 (GOWN DISPOSABLE) ×4 IMPLANT
HEMOSTAT SNOW SURGICEL 2X4 (HEMOSTASIS) ×2 IMPLANT
KIT BASIN OR (CUSTOM PROCEDURE TRAY) ×2 IMPLANT
KIT ROOM TURNOVER OR (KITS) ×2 IMPLANT
LIGASURE IMPACT 36 18CM CVD LR (INSTRUMENTS) IMPLANT
MANIFOLD NEPTUNE WASTE (CANNULA) ×2 IMPLANT
NEEDLE 18GX1X1/2 (RX/OR ONLY) (NEEDLE) ×2 IMPLANT
NS IRRIG 1000ML POUR BTL (IV SOLUTION) ×10 IMPLANT
PACK GENERAL/GYN (CUSTOM PROCEDURE TRAY) ×2 IMPLANT
PAD ARMBOARD 7.5X6 YLW CONV (MISCELLANEOUS) ×2 IMPLANT
PENCIL BUTTON HOLSTER BLD 10FT (ELECTRODE) ×4 IMPLANT
SPECIMEN JAR LARGE (MISCELLANEOUS) IMPLANT
SPONGE GAUZE 4X4 12PLY (GAUZE/BANDAGES/DRESSINGS) ×4 IMPLANT
SPONGE LAP 18X18 X RAY DECT (DISPOSABLE) ×10 IMPLANT
STAPLER VISISTAT 35W (STAPLE) ×2 IMPLANT
SUCTION POOLE TIP (SUCTIONS) ×2 IMPLANT
SUT CHROMIC 0 BP (SUTURE) ×6 IMPLANT
SUT CHROMIC 0 SH (SUTURE) ×2 IMPLANT
SUT ETHILON 2 0 FS 18 (SUTURE) ×2 IMPLANT
SUT PDS AB 1 TP1 96 (SUTURE) ×4 IMPLANT
SUT PROLENE 2 0 CT 30 (SUTURE) ×4 IMPLANT
SUT SILK 2 0 SH CR/8 (SUTURE) ×4 IMPLANT
SUT SILK 2 0 TIES 10X30 (SUTURE) ×2 IMPLANT
SUT SILK 3 0 SH CR/8 (SUTURE) ×2 IMPLANT
SUT SILK 3 0 TIES 10X30 (SUTURE) ×2 IMPLANT
SYRINGE 10CC LL (SYRINGE) ×2 IMPLANT
SYSTEM SAHARA CHEST DRAIN ATS (WOUND CARE) ×2 IMPLANT
TAPE CLOTH SURG 6X10 WHT LF (GAUZE/BANDAGES/DRESSINGS) ×4 IMPLANT
TOWEL OR 17X26 10 PK STRL BLUE (TOWEL DISPOSABLE) ×2 IMPLANT
TRAY FOLEY CATH 14FRSI W/METER (CATHETERS) IMPLANT
WATER STERILE IRR 1000ML POUR (IV SOLUTION) IMPLANT
YANKAUER SUCT BULB TIP NO VENT (SUCTIONS) IMPLANT
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
## 2013-01-17 NOTE — Progress Notes (Signed)
Patient ID: Dakota Evans, male   DOB: 09-17-1985, 28 y.o.   MRN: [id number] I met with multiple family members to review the current clinical situation and plan of care.  I answered their questions. Violeta Gelinas, MD, MPH, FACS Pager: [phone]

## 2013-01-17 NOTE — Anesthesia Procedure Notes (Signed)
Date/Time: 01/17/2013 3:11 AM Performed by: Wray Kearns A Pre-anesthesia Checklist: Patient identified, Timeout performed, Emergency Drugs available, Suction available and Patient being monitored Patient Re-evaluated:Patient Re-evaluated prior to inductionOxygen Delivery Method: Circle system utilized Preoxygenation: Pre-oxygenation with 100% oxygen Intubation Type: Combination inhalational/ intravenous induction Tube type: Subglottic suction tube Tube size: 8.0 mm Placement Confirmation: positive ETCO2 Secured at: 24 cm Comments: Pt intubated in [facility] ROOM prior to OR .
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
## 2013-01-17 NOTE — ED Notes (Signed)
X-ray at bedside
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
## 2013-01-17 NOTE — Anesthesia Postprocedure Evaluation (Signed)
  Anesthesia Post-op Note  Patient: Dakota Evans  Procedure(s) Performed: Procedure(s): EXPLORATORY LAPAROTOMY; Hepatorahaphy; Placement of chest tube; Repair of diaphragm (N/A)  Patient Location: ICU  Anesthesia Type:General  Level of Consciousness: Patient remains intubated per anesthesia plan  Airway and Oxygen Therapy: Patient placed on Ventilator (see vital sign flow sheet for setting)  Post-op Pain: none  Post-op Assessment: Post-op Vital signs reviewed, Patient's Cardiovascular Status Stable, Respiratory Function Stable, Patent Airway, No signs of Nausea or vomiting and Pain level controlled  Post-op Vital Signs: stable  Complications: No apparent anesthesia complications

## 2013-01-17 NOTE — Transfer of Care (Signed)
Immediate Anesthesia Transfer of Care Note  Patient: Dakota Evans  Procedure(s) Performed: Procedure(s): EXPLORATORY LAPAROTOMY; Hepatorahaphy; Placement of chest tube; Repair of diaphragm (N/A)  Patient Location: NICU  Anesthesia Type:General  Level of Consciousness: Patient remains intubated per anesthesia plan  Airway & Oxygen Therapy: Patient remains intubated per anesthesia plan and Patient placed on Ventilator (see vital sign flow sheet for setting)  Post-op Assessment: Report given to NICU RN and Post -op Vital signs reviewed and stable  Post vital signs: Reviewed and stable  Complications: No apparent anesthesia complications

## 2013-01-17 NOTE — Progress Notes (Signed)
01/17/13 0700  Clinical Encounter Type  Visited With Patient and family together;Health care provider  Visit Type Follow-up;Spiritual support;Social support  Referral From Nurse [facility] Ezra Sites, RN)  Recommendations Will refer to day chaplain for continued care.  Spiritual Encounters  Spiritual Needs Emotional  Stress Factors  Family Stress Factors Major life changes;Loss of control   Paged by [facility] to provide further support to family after they heard update from Dr Lindie Spruce re neuro consult and possible neurosurgery.  Provided pastoral and logistical support to large family, including siblings and grandfather Fayrene Fearing, who, per [facility], adopted [name] when he was young.  Some family members have appreciated the quiet, peaceful space of the chapel for prayer.  At this time, family desires private, quiet time to process what is happening and is eager to be able to see [facility].  Will refer to day chaplain for follow-up care.  [address] [place], [place] [phone]

## 2013-01-17 NOTE — Procedures (Signed)
Central Venous Catheter Insertion Procedure Note [facility] [id number] 1986/03/06  Procedure: Insertion of Central Venous Catheter Indications: Drug and/or fluid administration  Procedure Details Consent: Risks of procedure as well as the alternatives and risks of each were explained to the (patient/caregiver).  Consent for procedure obtained. Time Out: Verified patient identification, verified procedure, site/side was marked, verified correct patient position, special equipment/implants available, medications/allergies/relevent history reviewed, required imaging and test results available.  Performed  Maximum sterile technique was used including antiseptics, cap, gloves, gown, hand hygiene, mask and sheet. Skin prep: Chlorhexidine; local anesthetic administered A antimicrobial bonded/coated triple lumen catheter was placed in the right subclavian vein using the Seldinger technique.  Evaluation Blood flow good Complications: No apparent complications Patient did tolerate procedure well. Chest X-ray ordered to verify placement.  CXR: pending.  Onyinyechi Huante E 01/17/2013, 10:32 AM
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
## 2013-01-17 NOTE — ED Notes (Signed)
Emergency Release - Blood up and hanging. Administered start
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
## 2013-01-17 NOTE — ED Notes (Signed)
Patient out for surgery with RNs, Tech, and MD

## 2013-01-17 NOTE — ED Notes (Signed)
Warm Fluids started and administered to patient

## 2013-01-17 NOTE — Progress Notes (Signed)
UR completed 
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
## 2013-01-18 ENCOUNTER — Inpatient Hospital Stay (HOSPITAL_COMMUNITY): Payer: 59

## 2013-01-18 DIAGNOSIS — D62 Acute posthemorrhagic anemia: Secondary | ICD-10-CM

## 2013-01-18 LAB — CBC
HCT: 29.8 % — ABNORMAL LOW (ref 39.0–52.0)
Hemoglobin: 10.6 g/dL — ABNORMAL LOW (ref 13.0–17.0)
MCH: 29.3 pg (ref 26.0–34.0)
MCHC: 34.9 g/dL (ref 30.0–36.0)
MCHC: 35.6 g/dL (ref 30.0–36.0)
MCV: 81.7 fL (ref 78.0–100.0)
Platelets: 155 10*3/uL (ref 150–400)
RDW: 13.4 % (ref 11.5–15.5)
WBC: 14.3 10*3/uL — ABNORMAL HIGH (ref 4.0–10.5)

## 2013-01-18 LAB — BLOOD GAS, ARTERIAL
Drawn by: 347641
FIO2: 0.4 %
MECHVT: 600 mL
O2 Saturation: 99.8 %
PEEP: 5 cmH2O
Patient temperature: 98.6
RATE: 20 resp/min

## 2013-01-18 LAB — BASIC METABOLIC PANEL
BUN: 9 mg/dL (ref 6–23)
Calcium: 7.2 mg/dL — ABNORMAL LOW (ref 8.4–10.5)
Chloride: 106 mEq/L (ref 96–112)
Creatinine, Ser: 1.26 mg/dL (ref 0.50–1.35)
GFR calc Af Amer: >90 mL/min (ref >90)
GFR calc non Af Amer: 78 mL/min — ABNORMAL LOW (ref >90)

## 2013-01-18 LAB — PREPARE FRESH FROZEN PLASMA: Unit division: 0

## 2013-01-18 LAB — URINE CULTURE: Culture: NO GROWTH

## 2013-01-18 MED ORDER — SODIUM CHLORIDE 0.9 % IV SOLN
1.0000 g | Freq: Once | INTRAVENOUS | Status: AC
  Administered 2013-01-18: 1 g via INTRAVENOUS
  Filled 2013-01-18: qty 10

## 2013-01-18 MED ORDER — HYDROMORPHONE HCL PF 1 MG/ML IJ SOLN
1.0000 mg | INTRAMUSCULAR | Status: DC | PRN
  Administered 2013-01-18 – 2013-01-21 (×13): 1 mg via INTRAVENOUS
  Filled 2013-01-18 (×13): qty 1
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
## 2013-01-18 NOTE — Progress Notes (Signed)
Patient ID: Dakota Evans, male   DOB: [birth year], 27 y.o.   MRN: [id number] Has done well since extubation.  Able to F/C.  Able to name some objects but not others - some word-finding difficulties/expressive aphasia.  Will order TBI team therapies.  I spoke to his girlfriend and others in the room. Violeta Gelinas, MD, MPH, FACS Pager: [phone]

## 2013-01-18 NOTE — Procedures (Signed)
Extubation Procedure Note  Patient Details:   Name: Dakota Evans DOB: 1985/09/15 MRN: [id number]   Patient extubated to 2L [place] per MD.   Patient tolerating well but slightly anxious.  Vitals WNL.  Rt will monitor.     Evaluation  O2 sats: stable throughout Complications: No apparent complications Patient did tolerate procedure well. Bilateral Breath Sounds: Clear;Diminished Suctioning: Airway Yes  Harley Hallmark 01/18/2013, 10:09 AM

## 2013-01-18 NOTE — Progress Notes (Signed)
Patient ID: Dakota Evans, male   DOB: [birth year], 27 y.o.   MRN: [id number] Patient awake and alert, seems to move all extremities, seems to occasionally follow simple commands, may have some receptive aphasia, eyes open spontaneously and he looks around the room. Pleased with progress. Following.
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
## 2013-01-19 ENCOUNTER — Inpatient Hospital Stay (HOSPITAL_COMMUNITY): Payer: 59

## 2013-01-19 DIAGNOSIS — J9600 Acute respiratory failure, unspecified whether with hypoxia or hypercapnia: Secondary | ICD-10-CM

## 2013-01-19 DIAGNOSIS — R509 Fever, unspecified: Secondary | ICD-10-CM

## 2013-01-19 LAB — CBC
MCH: 28.1 pg (ref 26.0–34.0)
MCHC: 34.2 g/dL (ref 30.0–36.0)
MCV: 82.1 fL (ref 78.0–100.0)
Platelets: 153 10*3/uL (ref 150–400)
RDW: 13.2 % (ref 11.5–15.5)

## 2013-01-19 LAB — BASIC METABOLIC PANEL
Calcium: 8.1 mg/dL — ABNORMAL LOW (ref 8.4–10.5)
Creatinine, Ser: 1.06 mg/dL (ref 0.50–1.35)
GFR calc Af Amer: >90 mL/min (ref >90)

## 2013-01-19 MED ORDER — IOHEXOL 300 MG/ML  SOLN
100.0000 mL | Freq: Once | INTRAMUSCULAR | Status: AC | PRN
  Administered 2013-01-19: 100 mL via INTRAVENOUS

## 2013-01-19 MED ORDER — LORAZEPAM 2 MG/ML IJ SOLN
INTRAMUSCULAR | Status: AC
  Administered 2013-01-19: 1 mg via INTRAVENOUS
  Filled 2013-01-19: qty 1

## 2013-01-19 MED ORDER — IOHEXOL 300 MG/ML  SOLN
25.0000 mL | INTRAMUSCULAR | Status: AC
  Administered 2013-01-19 (×2): 25 mL via ORAL

## 2013-01-19 MED ORDER — LORAZEPAM 2 MG/ML IJ SOLN: 1.0000 mg | Freq: Once | INTRAMUSCULAR | Status: AC
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
## 2013-01-19 NOTE — Progress Notes (Addendum)
L Radial A line removed.  Pressure held at incision site.  No bleeding noted.  Pressure dressing applied.  Catheter intact upon removal.

## 2013-01-19 NOTE — Progress Notes (Signed)
Dr Harlon Flor notified and provided an update regarding vital signs trends: persistent tachpynea, respirations shallow, O2 sats 90-92% on 6L/nasal cannula(oxygen having to be increased over past couple of hours from 2-6L) and patient constantly moaning with complaints of pain/discomfort. New orders received.

## 2013-01-20 ENCOUNTER — Inpatient Hospital Stay (HOSPITAL_COMMUNITY): Payer: 59

## 2013-01-20 DIAGNOSIS — J9601 Acute respiratory failure with hypoxia: Secondary | ICD-10-CM | POA: Diagnosis present

## 2013-01-20 DIAGNOSIS — G8911 Acute pain due to trauma: Secondary | ICD-10-CM

## 2013-01-20 LAB — POCT I-STAT 3, ART BLOOD GAS (G3+)
O2 Saturation: 93 %
TCO2: 28 mmol/L (ref 0–100)
pCO2 arterial: 38.9 mmHg (ref 35.0–45.0)
pH, Arterial: 7.428 (ref 7.350–7.450)
pH, Arterial: 7.442 (ref 7.350–7.450)

## 2013-01-20 LAB — BLOOD GAS, ARTERIAL
Bicarbonate: 26.5 mEq/L — ABNORMAL HIGH (ref 20.0–24.0)
O2 Saturation: 90.8 %
pCO2 arterial: 48 mmHg — ABNORMAL HIGH (ref 35.0–45.0)
pH, Arterial: 7.368 (ref 7.350–7.450)
pO2, Arterial: 65.3 mmHg — ABNORMAL LOW (ref 80.0–100.0)

## 2013-01-20 LAB — COMPREHENSIVE METABOLIC PANEL
AST: 135 U/L — ABNORMAL HIGH (ref 0–37)
Albumin: 2.6 g/dL — ABNORMAL LOW (ref 3.5–5.2)
Chloride: 102 mEq/L (ref 96–112)
Creatinine, Ser: 0.98 mg/dL (ref 0.50–1.35)
Total Bilirubin: 0.4 mg/dL (ref 0.3–1.2)

## 2013-01-20 LAB — CBC
MCV: 81.7 fL (ref 78.0–100.0)
Platelets: 194 10*3/uL (ref 150–400)
RDW: 13.2 % (ref 11.5–15.5)
WBC: 14 10*3/uL — ABNORMAL HIGH (ref 4.0–10.5)

## 2013-01-20 MED ORDER — PIPERACILLIN-TAZOBACTAM 3.375 G IVPB 30 MIN
3.3750 g | Freq: Once | INTRAVENOUS | Status: AC
  Administered 2013-01-20: 3.375 g via INTRAVENOUS
  Filled 2013-01-20: qty 50

## 2013-01-20 MED ORDER — TRAMADOL HCL 50 MG PO TABS: 50.0000 mg | ORAL_TABLET | Freq: Four times a day (QID) | ORAL | Status: DC | PRN

## 2013-01-20 MED ORDER — SODIUM CHLORIDE 0.9 % IJ SOLN
10.0000 mL | INTRAMUSCULAR | Status: DC | PRN
  Administered 2013-01-29: 10 mL

## 2013-01-20 MED ORDER — CLINDAMYCIN PHOSPHATE 900 MG/50ML IV SOLN
900.0000 mg | Freq: Three times a day (TID) | INTRAVENOUS | Status: DC
  Administered 2013-01-20: 900 mg via INTRAVENOUS
  Filled 2013-01-20 (×3): qty 50

## 2013-01-20 MED ORDER — VANCOMYCIN HCL IN DEXTROSE 1-5 GM/200ML-% IV SOLN: 1000.0000 mg | Freq: Two times a day (BID) | INTRAVENOUS | Status: DC

## 2013-01-20 MED ORDER — SODIUM CHLORIDE 0.9 % IJ SOLN
10.0000 mL | Freq: Two times a day (BID) | INTRAMUSCULAR | Status: DC
  Administered 2013-01-20 – 2013-01-27 (×8): 10 mL
  Administered 2013-01-28: 20 mL
  Administered 2013-01-29 – 2013-01-31 (×3): 10 mL
  Administered 2013-01-31: 20 mL
  Administered 2013-02-02: 40 mL
  Administered 2013-02-02 (×2): 10 mL
  Administered 2013-02-03: 40 mL
  Administered 2013-02-03 – 2013-02-04 (×3): 10 mL

## 2013-01-20 MED ORDER — PIPERACILLIN-TAZOBACTAM 3.375 G IVPB
3.3750 g | Freq: Three times a day (TID) | INTRAVENOUS | Status: DC
  Administered 2013-01-20 – 2013-01-26 (×17): 3.375 g via INTRAVENOUS
  Filled 2013-01-20 (×18): qty 50

## 2013-01-20 MED ORDER — SODIUM CHLORIDE 0.9 % IV BOLUS (SEPSIS)
1000.0000 mL | Freq: Once | INTRAVENOUS | Status: AC
  Administered 2013-01-20: 1000 mL via INTRAVENOUS

## 2013-01-20 MED ORDER — DEXTROSE 5 % IV SOLN
2.0000 g | Freq: Three times a day (TID) | INTRAVENOUS | Status: DC
  Administered 2013-01-20: 2 g via INTRAVENOUS
  Filled 2013-01-20 (×3): qty 2

## 2013-01-20 MED ORDER — VANCOMYCIN HCL IN DEXTROSE 1-5 GM/200ML-% IV SOLN
1000.0000 mg | Freq: Two times a day (BID) | INTRAVENOUS | Status: DC
  Administered 2013-01-20 – 2013-01-23 (×7): 1000 mg via INTRAVENOUS
  Filled 2013-01-20 (×8): qty 200

## 2013-01-20 MED ORDER — OXYCODONE-ACETAMINOPHEN 5-325 MG PO TABS: 2.0000 | ORAL_TABLET | Freq: Four times a day (QID) | ORAL | Status: DC | PRN

## 2013-01-20 MED ORDER — VANCOMYCIN HCL 10 G IV SOLR
2000.0000 mg | Freq: Once | INTRAVENOUS | Status: AC
  Administered 2013-01-20: 2000 mg via INTRAVENOUS
  Filled 2013-01-20: qty 2000
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
## 2013-01-20 NOTE — Significant Event (Signed)
Dr Harlon Flor provided with an update re: patient's heart rate 150's-160's, respiratory rate 30's-50's, oxygen increased to 100% nonrebreather due to sats 90% on 6L nasal cannula. Patient persistently moaning, pointed to head when asked if he was hurting. Bladder scan revealed of urine. In and out cath performed, clear yellow urine returned. CCM consult ordered to assist with patient management. Dr Deterding provided with an update. Dr Curt Bears came to bedside to assess and assist with patient care. New orders obtained.
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
## 2013-01-21 ENCOUNTER — Inpatient Hospital Stay (HOSPITAL_COMMUNITY): Payer: 59

## 2013-01-21 ENCOUNTER — Encounter (HOSPITAL_COMMUNITY): Payer: Self-pay | Admitting: *Deleted

## 2013-01-21 DIAGNOSIS — S36529A Contusion of unspecified part of colon, initial encounter: Secondary | ICD-10-CM

## 2013-01-21 DIAGNOSIS — S31139A Puncture wound of abdominal wall without foreign body, unspecified quadrant without penetration into peritoneal cavity, initial encounter: Secondary | ICD-10-CM

## 2013-01-21 DIAGNOSIS — S06369A Traumatic hemorrhage of cerebrum, unspecified, with loss of consciousness of unspecified duration, initial encounter: Secondary | ICD-10-CM

## 2013-01-21 DIAGNOSIS — D62 Acute posthemorrhagic anemia: Secondary | ICD-10-CM | POA: Diagnosis not present

## 2013-01-21 DIAGNOSIS — S27809A Unspecified injury of diaphragm, initial encounter: Secondary | ICD-10-CM

## 2013-01-21 DIAGNOSIS — R4701 Aphasia: Secondary | ICD-10-CM | POA: Diagnosis present

## 2013-01-21 DIAGNOSIS — S069XAA Unspecified intracranial injury with loss of consciousness status unknown, initial encounter: Secondary | ICD-10-CM

## 2013-01-21 DIAGNOSIS — S36119A Unspecified injury of liver, initial encounter: Secondary | ICD-10-CM

## 2013-01-21 DIAGNOSIS — S0636AA Traumatic hemorrhage of cerebrum, unspecified, with loss of consciousness status unknown, initial encounter: Secondary | ICD-10-CM

## 2013-01-21 DIAGNOSIS — W320XXA Accidental handgun discharge, initial encounter: Secondary | ICD-10-CM

## 2013-01-21 DIAGNOSIS — S069X9A Unspecified intracranial injury with loss of consciousness of unspecified duration, initial encounter: Secondary | ICD-10-CM

## 2013-01-21 DIAGNOSIS — S0193XA Puncture wound without foreign body of unspecified part of head, initial encounter: Secondary | ICD-10-CM

## 2013-01-21 LAB — TYPE AND SCREEN
Antibody Screen: NEGATIVE
Unit division: 0
Unit division: 0
Unit division: 0
Unit division: 0
Unit division: 0

## 2013-01-21 LAB — POCT I-STAT 7, (LYTES, BLD GAS, ICA,H+H)
Bicarbonate: 21.6 mEq/L (ref 20.0–24.0)
HCT: 25 % — ABNORMAL LOW (ref 39.0–52.0)
Hemoglobin: 8.5 g/dL — ABNORMAL LOW (ref 13.0–17.0)
Sodium: 142 mEq/L (ref 135–145)
pH, Arterial: 7.336 — ABNORMAL LOW (ref 7.350–7.450)
pO2, Arterial: 407 mmHg — ABNORMAL HIGH (ref 80.0–100.0)

## 2013-01-21 LAB — URINE CULTURE
Colony Count: NO GROWTH
Culture: NO GROWTH

## 2013-01-21 LAB — CBC
Platelets: 242 10*3/uL (ref 150–400)
RBC: 3.09 MIL/uL — ABNORMAL LOW (ref 4.22–5.81)
WBC: 15.7 10*3/uL — ABNORMAL HIGH (ref 4.0–10.5)

## 2013-01-21 MED ORDER — HYDROMORPHONE HCL PF 1 MG/ML IJ SOLN
0.5000 mg | INTRAMUSCULAR | Status: DC | PRN
  Administered 2013-01-22 – 2013-01-25 (×7): 0.5 mg via INTRAVENOUS
  Filled 2013-01-21 (×6): qty 1

## 2013-01-21 MED ORDER — OXYCODONE HCL 5 MG PO TABS
5.0000 mg | ORAL_TABLET | ORAL | Status: DC | PRN
  Administered 2013-01-22: 10 mg via ORAL
  Filled 2013-01-21: qty 2

## 2013-01-21 MED ORDER — ACETAMINOPHEN 325 MG PO TABS
650.0000 mg | ORAL_TABLET | ORAL | Status: DC | PRN
  Administered 2013-01-21 – 2013-01-23 (×7): 650 mg via ORAL
  Filled 2013-01-21 (×7): qty 2

## 2013-01-21 MED ORDER — ACETAMINOPHEN 650 MG RE SUPP
650.0000 mg | RECTAL | Status: DC | PRN
  Administered 2013-01-24 – 2013-01-31 (×6): 650 mg via RECTAL
  Filled 2013-01-21 (×6): qty 1
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
## 2013-01-21 NOTE — Progress Notes (Signed)
I had him try to name several objects - he was able to correctly name about 2/3 of them - the others he said either related words or nonsensical words.  D/C NGT and allow clears.  To 3300. Patient examined and I agree with the assessment and plan  Violeta Gelinas, MD, MPH, FACS Pager: [phone]  01/21/2013 9:42 AM
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
## 2013-01-21 NOTE — Progress Notes (Signed)
Rehab Admissions Coordinator Note:  Patient was screened by Brock Ra for appropriateness for an Inpatient Acute Rehab Consult.  At this time, we are recommending Inpatient Rehab consult.  Brock Ra 01/21/2013, 8:48 AM  I can be reached at [phone].

## 2013-01-21 NOTE — Progress Notes (Signed)
Speech Language Pathology Treatment Patient Details Name: Dakota Evans MRN: [id number] DOB: 03/28/1986 Today's Date: 01/21/2013 Time: 1191-4782 SLP Time Calculation (min): 25 min  Assessment / Plan / Recommendation Clinical Impression  Pt demonstrates moderate expressive and receptive aphasia with perseveration and phonemic paraphasias with confrontational naming, neologisims and jargon at phrase level with some appropriate phrases with automatic speech and requests. With max cues pt was able to correctly name 8/10 familiar objects, repeated 0/3 words correctly, followed 50% of simple commands. Pt did demonstrate some approriate automatic behaviors and language in functional tasks with min contextual cues. Though pt is not a typical brain injury, would characterize cognition as an emerging VI (confused/appropriate response). Pt is progressing with goals and will benefit from ongoing SLP therapy for cognition and langauge. Needs CIR if possible.     SLP Plan  Continue with current plan of care    Pertinent Vitals/Pain NA  SLP Goals  SLP Goals Potential to Achieve Goals: Good SLP Goal #1: Pt will follow one-step commands with 90% accuracy, given verbal, visual, and/or tactile cues. SLP Goal #1 - Progress: Progressing toward goal SLP Goal #2: Pt will answer simple/concrete yes/no questions with 90% accuracy. SLP Goal #2 - Progress: Progressing toward goal SLP Goal #3: Pt will complete automatic sequences with 90% accuracy, given verbal, visual, and/or tactile cues. SLP Goal #3 - Progress: Progressing toward goal SLP Goal #4: Pt will repeat functional words/phrases with 90% accuracy. SLP Goal #4 - Progress: Progressing toward goal SLP Goal #5: Pt will name common objects with 90% accuracy given moderate verbal cues SLP Goal #5 - Progress: Progressing toward goal  General    Oral Cavity - Oral Hygiene     Treatment Treatment focused on: Cognition;Aphasia   GO     Keiko Myricks,  Riley Nearing 01/21/2013, 9:17 AM
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
## 2013-01-21 NOTE — Progress Notes (Signed)
01/21/13 2000  Vitals  Temp ! 102.8 F (39.3 C)  650 tylenol given attempting to get pt to use IS but pt havinving hard time grasping concept

## 2013-01-21 NOTE — Progress Notes (Signed)
Charma Igo PA notified of pt sustained asymptomatic pulse rate of mid to upper 120s. No orders given, told to call if pt sustains pulse rate in 130s or become symptomatic. Will continue to monitor.

## 2013-01-22 ENCOUNTER — Encounter (HOSPITAL_COMMUNITY): Payer: Self-pay | Admitting: General Surgery

## 2013-01-22 ENCOUNTER — Inpatient Hospital Stay (HOSPITAL_COMMUNITY): Payer: 59

## 2013-01-22 DIAGNOSIS — D72829 Elevated white blood cell count, unspecified: Secondary | ICD-10-CM

## 2013-01-22 LAB — CULTURE, BLOOD (ROUTINE X 2)

## 2013-01-22 LAB — CBC
MCV: 81 fL (ref 78.0–100.0)
Platelets: 264 10*3/uL (ref 150–400)
RDW: 13.5 % (ref 11.5–15.5)
WBC: 15.8 10*3/uL — ABNORMAL HIGH (ref 4.0–10.5)

## 2013-01-22 LAB — CULTURE, RESPIRATORY W GRAM STAIN

## 2013-01-22 MED ORDER — SODIUM CHLORIDE 0.9 % IV BOLUS (SEPSIS)
1000.0000 mL | Freq: Once | INTRAVENOUS | Status: AC
  Administered 2013-01-22: 1000 mL via INTRAVENOUS

## 2013-01-22 MED ORDER — IBUPROFEN 400 MG PO TABS
400.0000 mg | ORAL_TABLET | Freq: Three times a day (TID) | ORAL | Status: DC | PRN
  Administered 2013-01-22 – 2013-02-04 (×3): 400 mg via ORAL
  Filled 2013-01-22 (×3): qty 1
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
## 2013-01-22 NOTE — Progress Notes (Signed)
Met pt, for first time, after offering spiritual and emotional spt to family. Pt was sitting up in bed when I arrived. His grandmother, daughter, and another family member were present when I arrived. Pt and family were grateful for visit and thanked me again for praying with them. Marjory Lies Chaplain  01/22/13 1400  Clinical Encounter Type  Visited With Patient and family together

## 2013-01-22 NOTE — Progress Notes (Signed)
Wound infection upper wound.  Check CX.  ABX for now.  RUE edema is due to IV infiltration.  Able to name a couple objects today but could not others.  Continue 3300 with tachypnea. Patient examined and I agree with the assessment and plan  Violeta Gelinas, MD, MPH, FACS Pager: [phone]  01/22/2013 9:13 AM
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
## 2013-01-22 NOTE — Clinical Social Work Note (Signed)
Clinical Social Worker continuing to follow patient and family for support.  Patient is verbalizing and recognizing some objects but not all at this time.  Patient with receptive aphasia per speech therapy.  CSW will continue to follow patient for support and determine patient appropriateness for SBIRT completion.  Patient family seems to be coping well and excited about patient continued improvement.  Patient family hopeful for inpatient rehab at time of discharge.  CSW will remain available for support and discharge planning needs.  Dakota Evans, [place] [phone]

## 2013-01-22 NOTE — Progress Notes (Signed)
Patient ID: Dakota Evans, male   DOB: 09-04-1985, 27 y.o.   MRN: [id number] Notified by RN of fever and tachycardia.  Upper opened part of wound still with a lot of drainage.  I opened the wound a lot further draining more purulence.  Fascia sutures palpable but wound appears viable.  I also removed a couple staples from the lower portion - not much drainage there.  Both areas packed with wet to dry.  1L NS bolus. I spoke to his grandmother as well. Violeta Gelinas, MD, MPH, FACS Pager: [phone]

## 2013-01-23 ENCOUNTER — Encounter (HOSPITAL_COMMUNITY): Admission: EM | Disposition: A | Discharge status: Rehabilitation Facility | Payer: Self-pay | Source: Home / Self Care

## 2013-01-23 ENCOUNTER — Inpatient Hospital Stay (HOSPITAL_COMMUNITY): Payer: 59 | Admitting: Anesthesiology

## 2013-01-23 ENCOUNTER — Encounter (HOSPITAL_COMMUNITY): Payer: Self-pay | Admitting: Anesthesiology

## 2013-01-23 ENCOUNTER — Encounter (HOSPITAL_COMMUNITY): Payer: Self-pay | Admitting: Radiology

## 2013-01-23 ENCOUNTER — Inpatient Hospital Stay (HOSPITAL_COMMUNITY): Payer: 59

## 2013-01-23 HISTORY — PX: LAPAROTOMY: SHX154

## 2013-01-23 LAB — CBC
HCT: 29 % — ABNORMAL LOW (ref 39.0–52.0)
MCH: 28.2 pg (ref 26.0–34.0)
MCV: 82.6 fL (ref 78.0–100.0)
Platelets: 327 10*3/uL (ref 150–400)
RBC: 3.51 MIL/uL — ABNORMAL LOW (ref 4.22–5.81)

## 2013-01-23 SURGERY — LAPAROTOMY, EXPLORATORY
Anesthesia: General | Site: Abdomen | Wound class: Dirty or Infected

## 2013-01-23 MED ORDER — IOHEXOL 300 MG/ML  SOLN
25.0000 mL | INTRAMUSCULAR | Status: AC
  Administered 2013-01-23: 25 mL via ORAL

## 2013-01-23 MED ORDER — VANCOMYCIN HCL 1000 MG IV SOLR
1000.0000 mg | INTRAVENOUS | Status: DC | PRN
  Administered 2013-01-23: 1000 mg via INTRAVENOUS

## 2013-01-23 MED ORDER — VANCOMYCIN HCL IN DEXTROSE 1-5 GM/200ML-% IV SOLN
1000.0000 mg | Freq: Three times a day (TID) | INTRAVENOUS | Status: DC
  Administered 2013-01-24 – 2013-01-25 (×4): 1000 mg via INTRAVENOUS
  Filled 2013-01-23 (×6): qty 200

## 2013-01-23 MED ORDER — IOHEXOL 300 MG/ML  SOLN
100.0000 mL | Freq: Once | INTRAMUSCULAR | Status: AC | PRN
  Administered 2013-01-23: 100 mL via INTRAVENOUS

## 2013-01-23 MED ORDER — VANCOMYCIN HCL IN DEXTROSE 1-5 GM/200ML-% IV SOLN
INTRAVENOUS | Status: AC
  Filled 2013-01-23: qty 200

## 2013-01-23 SURGICAL SUPPLY — 54 items
BLADE SURG ROTATE 9660 (MISCELLANEOUS) IMPLANT
CANISTER SUCTION 2500CC (MISCELLANEOUS) ×4 IMPLANT
CHLORAPREP W/TINT 26ML (MISCELLANEOUS) IMPLANT
CLOTH BEACON ORANGE TIMEOUT ST (SAFETY) ×2 IMPLANT
COVER SURGICAL LIGHT HANDLE (MISCELLANEOUS) ×2 IMPLANT
DRAPE LAPAROSCOPIC ABDOMINAL (DRAPES) ×2 IMPLANT
DRAPE PROXIMA HALF (DRAPES) ×2 IMPLANT
DRAPE UTILITY 15X26 W/TAPE STR (DRAPE) ×4 IMPLANT
DRAPE WARM FLUID 44X44 (DRAPE) ×2 IMPLANT
DRSG PAD ABDOMINAL 8X10 ST (GAUZE/BANDAGES/DRESSINGS) ×4 IMPLANT
ELECT BLADE 6.5 EXT (BLADE) ×2 IMPLANT
ELECT CAUTERY BLADE 6.4 (BLADE) ×2 IMPLANT
ELECT REM PT RETURN 9FT ADLT (ELECTROSURGICAL) ×2
ELECTRODE REM PT RTRN 9FT ADLT (ELECTROSURGICAL) ×1 IMPLANT
GLOVE BIO SURGEON STRL SZ 6.5 (GLOVE) ×4 IMPLANT
GLOVE BIO SURGEON STRL SZ7 (GLOVE) ×2 IMPLANT
GLOVE BIO SURGEON STRL SZ8 (GLOVE) ×4 IMPLANT
GLOVE BIOGEL PI IND STRL 7.0 (GLOVE) ×1 IMPLANT
GLOVE BIOGEL PI IND STRL 7.5 (GLOVE) ×3 IMPLANT
GLOVE BIOGEL PI IND STRL 8 (GLOVE) ×2 IMPLANT
GLOVE BIOGEL PI INDICATOR 7.0 (GLOVE) ×1
GLOVE BIOGEL PI INDICATOR 7.5 (GLOVE) ×3
GLOVE BIOGEL PI INDICATOR 8 (GLOVE) ×2
GLOVE SURG SS PI 7.5 STRL IVOR (GLOVE) ×6 IMPLANT
GOWN PREVENTION PLUS XXLARGE (GOWN DISPOSABLE) ×2 IMPLANT
GOWN STRL NON-REIN LRG LVL3 (GOWN DISPOSABLE) ×2 IMPLANT
GOWN STRL REIN XL XLG (GOWN DISPOSABLE) ×4 IMPLANT
KIT BASIN OR (CUSTOM PROCEDURE TRAY) ×2 IMPLANT
KIT OSTOMY DRAINABLE 2.75 STR (WOUND CARE) ×2 IMPLANT
KIT ROOM TURNOVER OR (KITS) ×2 IMPLANT
LIGASURE IMPACT 36 18CM CVD LR (INSTRUMENTS) ×2 IMPLANT
NS IRRIG 1000ML POUR BTL (IV SOLUTION) ×8 IMPLANT
PACK GENERAL/GYN (CUSTOM PROCEDURE TRAY) ×2 IMPLANT
PAD ARMBOARD 7.5X6 YLW CONV (MISCELLANEOUS) ×2 IMPLANT
RELOAD PROXIMATE 75MM BLUE (ENDOMECHANICALS) ×2 IMPLANT
SEPRAFILM PROCEDURAL PACK 3X5 (MISCELLANEOUS) ×2 IMPLANT
SPECIMEN JAR LARGE (MISCELLANEOUS) ×2 IMPLANT
SPONGE GAUZE 4X4 12PLY (GAUZE/BANDAGES/DRESSINGS) ×6 IMPLANT
SPONGE LAP 18X18 X RAY DECT (DISPOSABLE) ×4 IMPLANT
STAPLER PROXIMATE 75MM BLUE (STAPLE) ×2 IMPLANT
STAPLER VISISTAT 35W (STAPLE) ×2 IMPLANT
SUCTION POOLE TIP (SUCTIONS) ×4 IMPLANT
SUT PDS AB 1 TP1 96 (SUTURE) ×4 IMPLANT
SUT SILK 2 0 SH CR/8 (SUTURE) ×2 IMPLANT
SUT SILK 2 0 TIES 10X30 (SUTURE) ×2 IMPLANT
SUT SILK 3 0 SH CR/8 (SUTURE) ×2 IMPLANT
SUT SILK 3 0 TIES 10X30 (SUTURE) ×2 IMPLANT
SUT VIC AB 2-0 SH 18 (SUTURE) ×2 IMPLANT
SUT VIC AB 3-0 SH 18 (SUTURE) ×2 IMPLANT
TAPE CLOTH SURG 6X10 WHT LF (GAUZE/BANDAGES/DRESSINGS) ×2 IMPLANT
TOWEL OR 17X26 10 PK STRL BLUE (TOWEL DISPOSABLE) ×2 IMPLANT
TRAY FOLEY CATH 14FRSI W/METER (CATHETERS) ×2 IMPLANT
WATER STERILE IRR 1000ML POUR (IV SOLUTION) IMPLANT
YANKAUER SUCT BULB TIP NO VENT (SUCTIONS) ×2 IMPLANT
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
## 2013-01-23 NOTE — Progress Notes (Signed)
UR completed 
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
## 2013-01-23 NOTE — Progress Notes (Signed)
Patient ID: Dakota Evans, male   DOB: 22-Nov-1985, 27 y.o.   MRN: [id number] Looks good from neurosurgical standpoint. Awake and alert and conversant and moving all extremities. Following.

## 2013-01-23 NOTE — Progress Notes (Signed)
Notified Dr. Janee Morn of pt's continued fever despite Tylenol administration. Ibuprofen order received. Will continue to monitor and assess.
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
## 2013-01-23 NOTE — Progress Notes (Signed)
Physical Therapy Treatment Patient Details Name: Dakota Evans MRN: [id number] DOB: [birth year] Today's Date: 01/23/2013 Time: 4098-1191 PT Time Calculation (min): 23 min  PT Assessment / Plan / Recommendation Comments on Treatment Session  Pt cont's to make progress with mobility today.  Increased ambulation distance however activity was limited due to increased HR & decreased 02 sats with activity.       Follow Up Recommendations  CIR     Does the patient have the potential to tolerate intense rehabilitation     Barriers to Discharge        Equipment Recommendations  Rolling walker with 5" wheels    Recommendations for Other Services Rehab consult  Frequency Min 4X/week   Plan Discharge plan remains appropriate;Frequency remains appropriate    Precautions / Restrictions Precautions Precautions: Fall Restrictions Weight Bearing Restrictions: No   Pertinent Vitals/Pain HR:  Up to 166 bpm with activity 02 sats:  Dropped to mid 80's with ambulation on 2L 02    Mobility  Bed Mobility Bed Mobility: Supine to Sit;Sitting - Scoot to Edge of Bed Supine to Sit: 5: Supervision;HOB elevated;With rails Sitting - Scoot to Edge of Bed: 5: Supervision Details for Bed Mobility Assistance: HOB elevated & use of rails needed.   HR increased to 150's with sitting EOB.   Transfers Transfers: Sit to Stand;Stand to Sit Sit to Stand: 4: Min guard;With upper extremity assist;From bed Stand to Sit: 4: Min guard;With upper extremity assist;With armrests;To chair/3-in-1 Details for Transfer Assistance: Cues for hand placement & technique.   Ambulation/Gait Ambulation/Gait Assistance: 4: Min assist Ambulation Distance (Feet): 25 Feet Assistive device: Rolling walker Ambulation/Gait Assistance Details: (A) for stability.  Pt's HR increased to mid 160's with ambulation & 02 sats dropped to 85-88% 2L.   Gait Pattern: Step-through pattern;Decreased stride length;Wide base of support Gait  velocity: decreased General Gait Details: Pt more steady today but HR increased to mid 160's & 02 sats dropped to mid 80's on 2L 02.    Stairs: No Wheelchair Mobility Wheelchair Mobility: No      PT Goals Acute Rehab PT Goals Time For Goal Achievement: 02/02/13 Potential to Achieve Goals: Good Pt will go Supine/Side to Sit: with modified independence;with rail PT Goal: Supine/Side to Sit - Progress: Progressing toward goal Pt will go Sit to Stand: with supervision;with upper extremity assist PT Goal: Sit to Stand - Progress: Progressing toward goal Pt will Transfer Bed to Chair/Chair to Bed: with supervision Pt will Ambulate: 51 - 150 feet;with min assist;with least restrictive assistive device PT Goal: Ambulate - Progress: Progressing toward goal Pt will Perform Home Exercise Program: Independently  Visit Information  Last PT Received On: 01/23/13 Assistance Needed: +2 (safety & equipment)    Subjective Data      Cognition  Cognition Arousal/Alertness: Awake/alert Behavior During Therapy: WFL for tasks assessed/performed Overall Cognitive Status: Impaired/Different from baseline Area of Impairment: Following commands;Problem solving;Orientation Orientation Level: Disoriented to;Place;Time Following Commands: Follows one step commands with increased time;Follows one step commands inconsistently Problem Solving: Slow processing;Decreased initiation;Requires verbal cues;Requires tactile cues;Difficulty sequencing Rancho Levels of Cognitive Functioning Rancho Los Amigos Scales of Cognitive Functioning: Confused/appropriate    Balance     End of Session PT - End of Session Activity Tolerance: Treatment limited secondary to medical complications (Comment) (increased HR) Patient left: in chair;with call bell/phone within reach Nurse Communication: Mobility status     Verdell Face, [place] [phone] 01/23/2013
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
## 2013-01-23 NOTE — Clinical Social Work Note (Signed)
Clinical Social Worker continuing to follow patient and family for support and discharge planning needs.  Patient continues to be inappropriate for SBIRT completion at this time.  Patient family is hopeful for inpatient rehab once patient is medically stable.  Clinical Social Worker will sign off for now as social work intervention is no longer needed. Please consult [place] again if new need arises.  Macario Golds, [place] [phone]

## 2013-01-23 NOTE — Progress Notes (Signed)
Rehab admissions - Patient not medically ready yet for inpatient rehab.  He does have [employer] through his mom.  I will need insurance authorization once patient is medically ready for rehab.  Call me for questions.  #[phone]

## 2013-01-24 ENCOUNTER — Encounter (HOSPITAL_COMMUNITY): Payer: Self-pay | Admitting: General Surgery

## 2013-01-24 DIAGNOSIS — IMO0002 Reserved for concepts with insufficient information to code with codable children: Secondary | ICD-10-CM

## 2013-01-24 DIAGNOSIS — K929 Disease of digestive system, unspecified: Secondary | ICD-10-CM

## 2013-01-24 DIAGNOSIS — K631 Perforation of intestine (nontraumatic): Secondary | ICD-10-CM

## 2013-01-24 LAB — CBC
MCH: 28.3 pg (ref 26.0–34.0)
Platelets: 351 10*3/uL (ref 150–400)
RBC: 3.11 MIL/uL — ABNORMAL LOW (ref 4.22–5.81)

## 2013-01-24 MED ORDER — HYDROMORPHONE HCL PF 1 MG/ML IJ SOLN
0.2500 mg | INTRAMUSCULAR | Status: DC | PRN
  Administered 2013-01-24 (×2): 0.5 mg via INTRAVENOUS
  Filled 2013-01-24: qty 1

## 2013-01-24 MED ORDER — ONDANSETRON HCL 4 MG/2ML IJ SOLN
INTRAMUSCULAR | Status: AC
  Filled 2013-01-24: qty 2

## 2013-01-24 MED ORDER — FENTANYL CITRATE 0.05 MG/ML IJ SOLN
INTRAMUSCULAR | Status: DC | PRN
  Administered 2013-01-23: 75 ug via INTRAVENOUS
  Administered 2013-01-24: 50 ug via INTRAVENOUS
  Administered 2013-01-24: 100 ug via INTRAVENOUS
  Administered 2013-01-24: 75 ug via INTRAVENOUS
  Administered 2013-01-24: 100 ug via INTRAVENOUS
  Administered 2013-01-24 (×2): 50 ug via INTRAVENOUS

## 2013-01-24 MED ORDER — IBUPROFEN 800 MG PO TABS
800.0000 mg | ORAL_TABLET | Freq: Three times a day (TID) | ORAL | Status: AC | PRN
  Administered 2013-01-25: 800 mg via ORAL
  Filled 2013-01-24 (×2): qty 1

## 2013-01-24 MED ORDER — SUCCINYLCHOLINE CHLORIDE 20 MG/ML IJ SOLN
INTRAMUSCULAR | Status: DC | PRN
  Administered 2013-01-23: 130 mg via INTRAVENOUS

## 2013-01-24 MED ORDER — POTASSIUM CHLORIDE IN NACL 20-0.9 MEQ/L-% IV SOLN
INTRAVENOUS | Status: AC
  Administered 2013-01-24 – 2013-01-25 (×5): via INTRAVENOUS
  Filled 2013-01-24 (×8): qty 1000

## 2013-01-24 MED ORDER — GLYCOPYRROLATE 0.2 MG/ML IJ SOLN
INTRAMUSCULAR | Status: DC | PRN
  Administered 2013-01-24: 0.6 mg via INTRAVENOUS

## 2013-01-24 MED ORDER — PROPOFOL 10 MG/ML IV BOLUS
INTRAVENOUS | Status: DC | PRN
  Administered 2013-01-23: 200 mg via INTRAVENOUS

## 2013-01-24 MED ORDER — MEPERIDINE HCL 25 MG/ML IJ SOLN: 6.2500 mg | INTRAMUSCULAR | Status: DC | PRN

## 2013-01-24 MED ORDER — SODIUM CHLORIDE 0.9 % IV SOLN
Freq: Once | INTRAVENOUS | Status: AC
  Administered 2013-01-24: 04:00:00 via INTRAVENOUS

## 2013-01-24 MED ORDER — MEPERIDINE HCL 25 MG/ML IJ SOLN
INTRAMUSCULAR | Status: AC
  Administered 2013-01-24: 6.25 mg
  Filled 2013-01-24: qty 1

## 2013-01-24 MED ORDER — ESMOLOL HCL 10 MG/ML IV SOLN
INTRAVENOUS | Status: DC | PRN
  Administered 2013-01-24: 20 mg via INTRAVENOUS
  Administered 2013-01-24: 25 mg
  Administered 2013-01-24: 20 mg via INTRAVENOUS

## 2013-01-24 MED ORDER — NEOSTIGMINE METHYLSULFATE 1 MG/ML IJ SOLN
INTRAMUSCULAR | Status: DC | PRN
  Administered 2013-01-24: 5 mg via INTRAVENOUS

## 2013-01-24 MED ORDER — ROCURONIUM BROMIDE 100 MG/10ML IV SOLN
INTRAVENOUS | Status: DC | PRN
  Administered 2013-01-23: 40 mg via INTRAVENOUS
  Administered 2013-01-24: 10 mg via INTRAVENOUS

## 2013-01-24 MED ORDER — LIDOCAINE HCL (CARDIAC) 20 MG/ML IV SOLN
INTRAVENOUS | Status: DC | PRN
  Administered 2013-01-23: 60 mg via INTRAVENOUS

## 2013-01-24 MED ORDER — HYDROMORPHONE HCL PF 1 MG/ML IJ SOLN
INTRAMUSCULAR | Status: AC
  Filled 2013-01-24: qty 1

## 2013-01-24 MED ORDER — 0.9 % SODIUM CHLORIDE (POUR BTL) OPTIME
TOPICAL | Status: DC | PRN
  Administered 2013-01-23 – 2013-01-24 (×4): 1000 mL

## 2013-01-24 MED ORDER — ACETAMINOPHEN 10 MG/ML IV SOLN
1000.0000 mg | Freq: Once | INTRAVENOUS | Status: AC
  Administered 2013-01-24: 1000 mg via INTRAVENOUS
  Filled 2013-01-24: qty 100

## 2013-01-24 MED ORDER — ALBUMIN HUMAN 5 % IV SOLN
25.0000 g | Freq: Once | INTRAVENOUS | Status: AC
  Administered 2013-01-24: 25 g via INTRAVENOUS
  Filled 2013-01-24: qty 250

## 2013-01-24 MED ORDER — METOPROLOL TARTRATE 1 MG/ML IV SOLN
5.0000 mg | Freq: Four times a day (QID) | INTRAVENOUS | Status: DC
  Administered 2013-01-24 – 2013-02-01 (×29): 5 mg via INTRAVENOUS
  Filled 2013-01-24 (×38): qty 5

## 2013-01-24 MED ORDER — LACTATED RINGERS IV SOLN
INTRAVENOUS | Status: DC | PRN
  Administered 2013-01-23 – 2013-01-24 (×3): via INTRAVENOUS

## 2013-01-24 MED ORDER — ONDANSETRON HCL 4 MG/2ML IJ SOLN
4.0000 mg | Freq: Four times a day (QID) | INTRAMUSCULAR | Status: DC | PRN
  Administered 2013-01-24 – 2013-02-03 (×3): 4 mg via INTRAVENOUS
  Filled 2013-01-24 (×3): qty 2

## 2013-01-24 MED ORDER — ONDANSETRON HCL 4 MG/2ML IJ SOLN
INTRAMUSCULAR | Status: DC | PRN
  Administered 2013-01-24: 4 mg via INTRAVENOUS

## 2013-01-24 MED ORDER — SODIUM CHLORIDE 0.9 % IV SOLN
500.0000 mL | Freq: Once | INTRAVENOUS | Status: AC
  Administered 2013-01-24: 500 mL via INTRAVENOUS

## 2013-01-24 MED ORDER — MIDAZOLAM HCL 5 MG/5ML IJ SOLN
INTRAMUSCULAR | Status: DC | PRN
  Administered 2013-01-23: 2 mg via INTRAVENOUS

## 2013-01-24 NOTE — Anesthesia Procedure Notes (Signed)
Procedure Name: Intubation Date/Time: 01/24/2013 11:41 PM Performed by: Molli Hazard Pre-anesthesia Checklist: Patient identified, Emergency Drugs available, Suction available and Patient being monitored Patient Re-evaluated:Patient Re-evaluated prior to inductionOxygen Delivery Method: Circle system utilized Preoxygenation: Pre-oxygenation with 100% oxygen Intubation Type: IV induction, Rapid sequence and Cricoid Pressure applied Laryngoscope Size: Miller and 2 Grade View: Grade I Tube type: Oral Tube size: 7.5 mm Number of attempts: 1 Airway Equipment and Method: Stylet Placement Confirmation: ETT inserted through vocal cords under direct vision,  positive ETCO2 and breath sounds checked- equal and bilateral Secured at: 21 cm Tube secured with: Tape Dental Injury: Teeth and Oropharynx as per pre-operative assessment

## 2013-01-24 NOTE — Progress Notes (Signed)
Asked patient orientation questions: name, birthday, location. His response to all questions is "My name is [name]." Asked him his birthday; his response "my name is [name]." Asked him the year; his response was "2000." At times he responds with a complete sentence, but his words are garbled. Responds immediately to questions asked, just not with appropriate answer. Pupils, equal and reactive. Will continue to closely monitor neuro status. Thresa Ross RN

## 2013-01-24 NOTE — Progress Notes (Signed)
Dr. Lindie Spruce at bedside. Aware pt. C/o nausea. Zofran IV ordered and given. Family updated at bedside. Will continue to monitor, educate, assess closely. Gildardo Pounds
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
## 2013-01-24 NOTE — Progress Notes (Signed)
Updated family via phone on pt. Status and POC agreeable and questions answered. Will continue to monitor, educate, assess. Dakota Evans.

## 2013-01-24 NOTE — Anesthesia Postprocedure Evaluation (Signed)
  Anesthesia Post-op Note  Patient: Dakota Evans  Procedure(s) Performed: Procedure(s): Reopening of recent laparotomy; RIGHT hemicolectomy with ileostomy (N/A)  Patient Location: PACU  Anesthesia Type:General  Level of Consciousness: awake  Airway and Oxygen Therapy: Patient Spontanous Breathing  Post-op Pain: mild  Post-op Assessment: Post-op Vital signs reviewed  Post-op Vital Signs: Reviewed  Complications: No apparent anesthesia complications

## 2013-01-24 NOTE — Progress Notes (Signed)
Spoke with Dr. Lindie Spruce re: HR 150's. Updated on VS and pt. Status. Orders received and processed. Will continue to monitor, educate, assess closely.  Gildardo Pounds

## 2013-01-24 NOTE — Transfer of Care (Signed)
Immediate Anesthesia Transfer of Care Note  Patient: Dakota Evans  Procedure(s) Performed: Procedure(s): EXPLORATORY LAPAROTOMY (N/A)  Patient Location: PACU  Anesthesia Type:General  Level of Consciousness: awake, alert  and patient cooperative  Airway & Oxygen Therapy: Patient Spontanous Breathing and Patient connected to face mask oxygen  Post-op Assessment: Report given to PACU RN  Post vital signs: Reviewed and stable  Complications: No apparent anesthesia complications

## 2013-01-24 NOTE — Progress Notes (Signed)
Chaplain Note:  Chaplain visited with pt who was awake and resting in bed.  Pt's girlfriend was at bedside in support of pt.  Pt was lethargic and expressing feelings of pain.  His spirits were neutral.  In reference to his hospital stay and hopes for recovery pt stated that he was "doing his time."   Chaplain provided spiritual comfort, support, and prayer for pt and pt's friend.  Pt and friend expressed appreciation for chaplain support.  Chaplain will follow up as needed.  01/24/13 0900  Clinical Encounter Type  Visited With Patient and family together  Visit Type Spiritual support  Referral From Chaplain  Spiritual Encounters  Spiritual Needs Prayer;Emotional  Stress Factors  Patient Stress Factors Health changes;Loss of control;Major life changes  Family Stress Factors Major life changes  Verdie Shire, [address] [phone]

## 2013-01-24 NOTE — Progress Notes (Signed)
Pt. HR up to 190's working with Physical Therapy per MD orders. Bp stable 107/52. Pt. Asymptomatic. Pt. Back to chair and HR down to 148. Will attempt ambulation again after HR continues to remain stable. Will continue to monitor, educate, assess. Gildardo Pounds

## 2013-01-24 NOTE — Progress Notes (Signed)
Dr. Harlon Flor paged regarding tachycardia and fever, orders received.

## 2013-01-24 NOTE — Progress Notes (Signed)
Detailed family update given at bedside on pt. Status, POC. Questions answered, agreeable. Mother, Mazzocco given note from social worker per request as she is the next of kin. Will continue to monitor, educate, assess closely. Gildardo Pounds

## 2013-01-24 NOTE — Progress Notes (Signed)
Dr. Lindie Spruce aware of pt. Temp 103 and HR >145 on monitor. Orders to give previously ordered Tylenol PR and monitor closely. Dakota Evans

## 2013-01-24 NOTE — Progress Notes (Addendum)
Physical Therapy Treatment Patient Details Name: Dakota Evans MRN: [id number] DOB: 04-19-1986 Today's Date: 01/24/2013 Time: 4098-1191 PT Time Calculation (min): 25 min  PT Assessment / Plan / Recommendation Comments on Treatment Session  Pt cont's to make progress with mobility today.  Increased ambulation distance however activity was limited due to increased HR with activity. Pt reports pain a 10/10.Nursing aware of high HR and pain.  Patient seems to be doing better with communication today answering some questions appropriately.      Follow Up Recommendations  CIR                 Equipment Recommendations  Rolling walker with 5" wheels    Recommendations for Other Services Rehab consult  Frequency Min 4X/week   Plan Discharge plan remains appropriate;Frequency remains appropriate    Precautions / Restrictions Precautions Precautions: Fall Restrictions Weight Bearing Restrictions: No   Pertinent Vitals/Pain HR from 150-192 bpm with therapy.  Nursing aware.  PAin 10/10 per pt and nursing notified of this as well.      Mobility  Bed Mobility Bed Mobility: Supine to Sit;Sitting - Scoot to Edge of Bed Rolling Right: Not tested (comment) Right Sidelying to Sit: Not tested (comment) Supine to Sit: HOB elevated;With rails;4: Min assist (cues for log roll to protect abdominal incision/help with pa) Sitting - Scoot to Edge of Bed: 5: Supervision Sit to Supine: Not Tested (comment) Details for Bed Mobility Assistance: HOB elevated & use of rails needed.   HR increased to 170's with sitting EOB.   Transfers Transfers: Sit to Stand;Stand to Sit Sit to Stand: 4: Min guard;With upper extremity assist;From bed Stand to Sit: 4: Min guard;With upper extremity assist;With armrests;To chair/3-in-1 Details for Transfer Assistance: Cues for hand placement & technique.   Ambulation/Gait Ambulation/Gait Assistance: 4: Min assist Ambulation Distance (Feet): 30 Feet Assistive device:  Rolling walker Ambulation/Gait Assistance Details: Pt needed assist to maneuver RW and for technique.  Had to have pt sit after 30 feet due to HR up to 192 bpm.  Nursing notified.  HR back to 148 bpm once pt rested in chair.   Gait Pattern: Step-through pattern;Decreased stride length;Wide base of support Gait velocity: decreased General Gait Details: Pt more steady today but HR increased to mid 170's with  02 sats maintained >90% on RA.    Stairs: No    Exercises General Exercises - Lower Extremity Ankle Circles/Pumps: AROM;Both;10 reps;Supine Quad Sets: AROM;Both;10 reps;Supine Long Arc Quad: AROM;Both;10 reps;Seated   PT Goals Acute Rehab PT Goals PT Goal Formulation: With patient/family Pt will go Supine/Side to Sit: with modified independence;with rail PT Goal: Supine/Side to Sit - Progress: Progressing toward goal Pt will go Sit to Stand: with supervision;with upper extremity assist PT Goal: Sit to Stand - Progress: Progressing toward goal Pt will Ambulate: 51 - 150 feet;with min assist;with least restrictive assistive device PT Goal: Ambulate - Progress: Progressing toward goal Pt will Perform Home Exercise Program: Independently PT Goal: Perform Home Exercise Program - Progress: Progressing toward goal  Visit Information  Last PT Received On: 01/24/13 Assistance Needed: +2 (for safety and equipment)    Subjective Data  Subjective: "My name is Dakota Evans," pt replied after being asked what his name was after 3rd attempt.   Cognition  Cognition Arousal/Alertness: Awake/alert Behavior During Therapy: WFL for tasks assessed/performed Overall Cognitive Status: Impaired/Different from baseline Area of Impairment: Following commands;Problem solving;Orientation Orientation Level: Disoriented to;Place;Time Current Attention Level: Sustained Memory: Decreased recall of precautions Following Commands: Follows one  step commands consistently;Follows one step commands with increased  time Problem Solving: Slow processing;Requires verbal cues;Requires tactile cues;Difficulty sequencing General Comments: Pt with much quicker response to commands.  Progressing with safety as well.   Rancho Levels of Cognitive Functioning Rancho Los Amigos Scales of Cognitive Functioning: Confused/appropriate    Insurance risk surveyor Sitting - Balance Support: Bilateral upper extremity supported;Feet supported Static Sitting - Level of Assistance: 7: Independent Static Sitting - Comment/# of Minutes: 3 Static Standing Balance Static Standing - Balance Support: Bilateral upper extremity supported Static Standing - Level of Assistance: 4: Min assist Static Standing - Comment/# of Minutes: only min guard asssit needed with RW for 2 minutes  End of Session PT - End of Session Equipment Utilized During Treatment: Gait belt Activity Tolerance: Patient limited by fatigue;Treatment limited secondary to medical complications (Comment) (limited by increased HR and RR during mobility/exercising.  ) Patient left: with call bell/phone within reach;in chair;with nursing in room (mom and infant daughter in room.  ) Nurse Communication: Mobility status       Dakota Evans,Dakota Evans 01/24/2013, 3:20 PM [facility] [phone] (pager)
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
## 2013-01-24 NOTE — Progress Notes (Signed)
Dr. Lindie Spruce paged with return call. Made aware that pt. Temp is 104.2 Orally 1 hour after PR Tylenol given. Orders received for cooling blanket and Motrin

## 2013-01-25 ENCOUNTER — Inpatient Hospital Stay (HOSPITAL_COMMUNITY): Payer: 59 | Admitting: Anesthesiology

## 2013-01-25 ENCOUNTER — Encounter (HOSPITAL_COMMUNITY): Payer: Self-pay | Admitting: Anesthesiology

## 2013-01-25 ENCOUNTER — Inpatient Hospital Stay (HOSPITAL_COMMUNITY): Payer: 59

## 2013-01-25 ENCOUNTER — Encounter (HOSPITAL_COMMUNITY): Admission: EM | Disposition: A | Discharge status: Rehabilitation Facility | Payer: Self-pay | Source: Home / Self Care

## 2013-01-25 DIAGNOSIS — S31109A Unspecified open wound of abdominal wall, unspecified quadrant without penetration into peritoneal cavity, initial encounter: Secondary | ICD-10-CM

## 2013-01-25 DIAGNOSIS — W3400XA Accidental discharge from unspecified firearms or gun, initial encounter: Secondary | ICD-10-CM

## 2013-01-25 HISTORY — PX: APPLICATION OF WOUND VAC: SHX5189

## 2013-01-25 HISTORY — PX: BOWEL RESECTION: SHX1257

## 2013-01-25 HISTORY — PX: LAPAROTOMY: SHX154

## 2013-01-25 LAB — CBC WITH DIFFERENTIAL/PLATELET
Basophils Relative: 1 % (ref 0–1)
Eosinophils Absolute: 0 10*3/uL (ref 0.0–0.7)
Eosinophils Relative: 0 % (ref 0–5)
HCT: 27.3 % — ABNORMAL LOW (ref 39.0–52.0)
Lymphs Abs: 1.4 10*3/uL (ref 0.7–4.0)
MCHC: 35.2 g/dL (ref 30.0–36.0)
Monocytes Absolute: 1.2 10*3/uL — ABNORMAL HIGH (ref 0.1–1.0)
Neutrophils Relative %: 88 % — ABNORMAL HIGH (ref 43–77)
WBC: 23.2 10*3/uL — ABNORMAL HIGH (ref 4.0–10.5)

## 2013-01-25 LAB — BASIC METABOLIC PANEL
BUN: 12 mg/dL (ref 6–23)
CO2: 24 mEq/L (ref 19–32)
Calcium: 8 mg/dL — ABNORMAL LOW (ref 8.4–10.5)
Chloride: 104 mEq/L (ref 96–112)
Creatinine, Ser: 0.93 mg/dL (ref 0.50–1.35)
Glucose, Bld: 122 mg/dL — ABNORMAL HIGH (ref 70–99)

## 2013-01-25 LAB — POCT I-STAT 3, ART BLOOD GAS (G3+)
Bicarbonate: 24.9 mEq/L — ABNORMAL HIGH (ref 20.0–24.0)
pH, Arterial: 7.408 (ref 7.350–7.450)

## 2013-01-25 LAB — CULTURE, ROUTINE-ABSCESS

## 2013-01-25 SURGERY — LAPAROTOMY, EXPLORATORY
Anesthesia: General | Site: Abdomen | Wound class: Dirty or Infected

## 2013-01-25 MED ORDER — VANCOMYCIN HCL IN DEXTROSE 1-5 GM/200ML-% IV SOLN
INTRAVENOUS | Status: AC
  Filled 2013-01-25: qty 200

## 2013-01-25 MED ORDER — SODIUM CHLORIDE 0.9 % IV SOLN
25.0000 ug/h | INTRAVENOUS | Status: DC
  Administered 2013-01-20: 350 ug/h via INTRAVENOUS
  Administered 2013-01-25: 50 ug/h via INTRAVENOUS
  Administered 2013-01-26: 200 ug/h via INTRAVENOUS
  Administered 2013-01-27: 350 ug/h via INTRAVENOUS
  Administered 2013-01-27: 300 ug/h via INTRAVENOUS
  Administered 2013-01-28: 350 ug/h via INTRAVENOUS
  Administered 2013-01-28: 400 ug/h via INTRAVENOUS
  Administered 2013-01-28: 350 ug/h via INTRAVENOUS
  Administered 2013-01-29 (×2): 400 ug/h via INTRAVENOUS
  Administered 2013-01-30: 250 ug/h via INTRAVENOUS
  Administered 2013-01-30 (×2): 400 ug/h via INTRAVENOUS
  Administered 2013-01-31 (×2): 200 ug/h via INTRAVENOUS
  Administered 2013-01-31: 400 ug/h via INTRAVENOUS
  Administered 2013-02-01: 100 ug/h via INTRAVENOUS
  Filled 2013-01-25 (×21): qty 50

## 2013-01-25 MED ORDER — PROPOFOL 10 MG/ML IV EMUL
INTRAVENOUS | Status: AC
  Filled 2013-01-25: qty 100

## 2013-01-25 MED ORDER — 0.9 % SODIUM CHLORIDE (POUR BTL) OPTIME
TOPICAL | Status: DC | PRN
  Administered 2013-01-25 (×4): 1000 mL

## 2013-01-25 MED ORDER — LACTATED RINGERS IV SOLN
INTRAVENOUS | Status: DC | PRN
  Administered 2013-01-25: 08:00:00 via INTRAVENOUS

## 2013-01-25 MED ORDER — ARTIFICIAL TEARS OP OINT
TOPICAL_OINTMENT | OPHTHALMIC | Status: DC | PRN
  Administered 2013-01-25: 1 via OPHTHALMIC

## 2013-01-25 MED ORDER — SODIUM CHLORIDE 0.9 % IV SOLN
100.0000 mg | Freq: Every day | INTRAVENOUS | Status: DC
  Administered 2013-01-25 – 2013-02-03 (×10): 100 mg via INTRAVENOUS
  Filled 2013-01-25 (×11): qty 100

## 2013-01-25 MED ORDER — LIDOCAINE HCL (CARDIAC) 20 MG/ML IV SOLN
INTRAVENOUS | Status: DC | PRN
  Administered 2013-01-25: 20 mg via INTRAVENOUS

## 2013-01-25 MED ORDER — MIDAZOLAM HCL 5 MG/5ML IJ SOLN
INTRAMUSCULAR | Status: DC | PRN
  Administered 2013-01-25 (×2): 1 mg via INTRAVENOUS

## 2013-01-25 MED ORDER — CHLORHEXIDINE GLUCONATE 0.12 % MT SOLN
OROMUCOSAL | Status: AC
  Administered 2013-01-25: 15 mL
  Filled 2013-01-25: qty 15

## 2013-01-25 MED ORDER — METOPROLOL TARTRATE 1 MG/ML IV SOLN
5.0000 mg | INTRAVENOUS | Status: DC | PRN
  Administered 2013-01-28: 5 mg via INTRAVENOUS
  Filled 2013-01-25 (×2): qty 5

## 2013-01-25 MED ORDER — SODIUM CHLORIDE 0.9 % IV SOLN
INTRAVENOUS | Status: DC
  Administered 2013-01-25: 13:00:00 via INTRAVENOUS

## 2013-01-25 MED ORDER — ALBUMIN HUMAN 5 % IV SOLN
12.5000 g | Freq: Once | INTRAVENOUS | Status: AC
  Administered 2013-01-25: 12.5 g via INTRAVENOUS
  Filled 2013-01-25: qty 250

## 2013-01-25 MED ORDER — ROCURONIUM BROMIDE 100 MG/10ML IV SOLN
INTRAVENOUS | Status: DC | PRN
  Administered 2013-01-25 (×2): 50 mg via INTRAVENOUS

## 2013-01-25 MED ORDER — PROPOFOL 10 MG/ML IV EMUL
5.0000 ug/kg/min | INTRAVENOUS | Status: DC
  Administered 2013-01-25: 40 ug/kg/min via INTRAVENOUS
  Administered 2013-01-25: 25 ug/kg/min via INTRAVENOUS
  Administered 2013-01-26: 45 ug/kg/min via INTRAVENOUS
  Administered 2013-01-26: 25 ug/kg/min via INTRAVENOUS
  Administered 2013-01-27 (×4): 40 ug/kg/min via INTRAVENOUS
  Administered 2013-01-28 (×2): 50 ug/kg/min via INTRAVENOUS
  Filled 2013-01-25 (×21): qty 100

## 2013-01-25 MED ORDER — SUCCINYLCHOLINE CHLORIDE 20 MG/ML IJ SOLN
INTRAMUSCULAR | Status: DC | PRN
  Administered 2013-01-25: 120 mg via INTRAVENOUS

## 2013-01-25 MED ORDER — FENTANYL BOLUS VIA INFUSION
25.0000 ug | Freq: Four times a day (QID) | INTRAVENOUS | Status: DC | PRN
  Filled 2013-01-25: qty 100

## 2013-01-25 MED ORDER — FENTANYL CITRATE 0.05 MG/ML IJ SOLN
INTRAMUSCULAR | Status: DC | PRN
  Administered 2013-01-25: 100 ug via INTRAVENOUS
  Administered 2013-01-25: 150 ug via INTRAVENOUS
  Administered 2013-01-25: 200 ug via INTRAVENOUS
  Administered 2013-01-25: 50 ug via INTRAVENOUS

## 2013-01-25 MED ORDER — PROPOFOL 10 MG/ML IV BOLUS
INTRAVENOUS | Status: DC | PRN
  Administered 2013-01-25: 100 mg via INTRAVENOUS

## 2013-01-25 MED ORDER — IPRATROPIUM BROMIDE 0.02 % IN SOLN: 0.5000 mg | Freq: Four times a day (QID) | RESPIRATORY_TRACT | Status: DC | PRN

## 2013-01-25 MED ORDER — ALBUTEROL SULFATE (5 MG/ML) 0.5% IN NEBU: 2.5000 mg | INHALATION_SOLUTION | Freq: Four times a day (QID) | RESPIRATORY_TRACT | Status: DC | PRN

## 2013-01-25 SURGICAL SUPPLY — 43 items
BENZOIN TINCTURE PRP APPL 2/3 (GAUZE/BANDAGES/DRESSINGS) ×6 IMPLANT
BLADE SURG ROTATE 9660 (MISCELLANEOUS) IMPLANT
CANISTER SUCTION 2500CC (MISCELLANEOUS) ×3 IMPLANT
CHLORAPREP W/TINT 26ML (MISCELLANEOUS) IMPLANT
CLOTH BEACON ORANGE TIMEOUT ST (SAFETY) ×3 IMPLANT
COVER SURGICAL LIGHT HANDLE (MISCELLANEOUS) ×3 IMPLANT
DRAPE LAPAROSCOPIC ABDOMINAL (DRAPES) ×3 IMPLANT
DRAPE UTILITY 15X26 W/TAPE STR (DRAPE) ×6 IMPLANT
DRAPE WARM FLUID 44X44 (DRAPE) ×3 IMPLANT
ELECT BLADE 6.5 EXT (BLADE) ×3 IMPLANT
ELECT CAUTERY BLADE 6.4 (BLADE) ×3 IMPLANT
ELECT REM PT RETURN 9FT ADLT (ELECTROSURGICAL) ×3
ELECTRODE REM PT RTRN 9FT ADLT (ELECTROSURGICAL) ×2 IMPLANT
GLOVE BIO SURGEON STRL SZ8 (GLOVE) ×3 IMPLANT
GLOVE BIOGEL PI IND STRL 7.0 (GLOVE) ×6 IMPLANT
GLOVE BIOGEL PI IND STRL 8 (GLOVE) ×2 IMPLANT
GLOVE BIOGEL PI INDICATOR 7.0 (GLOVE) ×3
GLOVE BIOGEL PI INDICATOR 8 (GLOVE) ×1
GLOVE ECLIPSE 7.0 STRL STRAW (GLOVE) ×3 IMPLANT
GLOVE SURG SS PI 7.0 STRL IVOR (GLOVE) ×6 IMPLANT
GOWN STRL NON-REIN LRG LVL3 (GOWN DISPOSABLE) ×9 IMPLANT
GOWN STRL REIN XL XLG (GOWN DISPOSABLE) ×3 IMPLANT
KIT BASIN OR (CUSTOM PROCEDURE TRAY) ×3 IMPLANT
KIT ROOM TURNOVER OR (KITS) ×3 IMPLANT
LIGASURE IMPACT 36 18CM CVD LR (INSTRUMENTS) IMPLANT
NS IRRIG 1000ML POUR BTL (IV SOLUTION) ×6 IMPLANT
PACK GENERAL/GYN (CUSTOM PROCEDURE TRAY) ×3 IMPLANT
PAD ARMBOARD 7.5X6 YLW CONV (MISCELLANEOUS) ×3 IMPLANT
RELOAD PROXIMATE 75MM BLUE (ENDOMECHANICALS) ×3 IMPLANT
SPECIMEN JAR LARGE (MISCELLANEOUS) IMPLANT
SPONGE LAP 18X18 X RAY DECT (DISPOSABLE) IMPLANT
STAPLER PROXIMATE 75MM BLUE (STAPLE) ×3 IMPLANT
STAPLER VISISTAT 35W (STAPLE) ×3 IMPLANT
SUCTION POOLE TIP (SUCTIONS) ×3 IMPLANT
SUT PDS AB 1 TP1 96 (SUTURE) IMPLANT
SUT SILK 2 0 SH CR/8 (SUTURE) ×3 IMPLANT
SUT SILK 2 0 TIES 10X30 (SUTURE) ×3 IMPLANT
SUT SILK 3 0 SH CR/8 (SUTURE) ×3 IMPLANT
SUT SILK 3 0 TIES 10X30 (SUTURE) ×3 IMPLANT
TOWEL OR 17X26 10 PK STRL BLUE (TOWEL DISPOSABLE) ×3 IMPLANT
TRAY FOLEY CATH 14FRSI W/METER (CATHETERS) IMPLANT
WATER STERILE IRR 1000ML POUR (IV SOLUTION) IMPLANT
YANKAUER SUCT BULB TIP NO VENT (SUCTIONS) ×3 IMPLANT

## 2013-01-25 NOTE — Progress Notes (Signed)
SLP Cancellation Note  Patient Details Name: Dakota Evans MRN: [id number] DOB: Aug 07, 1986   Cancelled treatment:       Reason Eval/Treat Not Completed: Medical issues which prohibited therapy.  Pt underwent emergent exploratory laparotomy this am; remains intubated.  D/C SLP orders received.  Please re-order SLP for aphasia/cognition when appropriate. Thanks, Renna Kilmer L. Samson Frederic, [place] CCC/SLP Pager [phone]'   Carolan Shiver 01/25/2013, 11:24 AM

## 2013-01-25 NOTE — Transfer of Care (Signed)
Immediate Anesthesia Transfer of Care Note  Patient: Dakota Evans  Procedure(s) Performed: Procedure(s): EXPLORATORY LAPAROTOMY (N/A) SMALL BOWEL RESECTION, RESECTION OF ILEOSTOMY, REPAIR OF SMALL BOWEL TIMES ONE. APPLICATION OF WOUND VAC  Patient Location: SICU  Anesthesia Type:General  Level of Consciousness: sedated and Patient remains intubated per anesthesia plan  Airway & Oxygen Therapy: Patient remains intubated per anesthesia plan and Patient placed on Ventilator (see vital sign flow sheet for setting)  Post-op Assessment: Report given to PACU RN and Post -op Vital signs reviewed and stable  Post vital signs: Reviewed and stable  Complications: No apparent anesthesia complications

## 2013-01-25 NOTE — Anesthesia Postprocedure Evaluation (Signed)
  Anesthesia Post-op Note  Patient: Dakota Evans  Procedure(s) Performed: Procedure(s): EXPLORATORY LAPAROTOMY (N/A) SMALL BOWEL RESECTION, RESECTION OF ILEOSTOMY, REPAIR OF SMALL BOWEL TIMES ONE. APPLICATION OF WOUND VAC  Patient Location: PACU  Anesthesia Type:General  Level of Consciousness: Patient remains intubated per anesthesia plan  Airway and Oxygen Therapy: Patient remains intubated per anesthesia plan and Patient placed on Ventilator (see vital sign flow sheet for setting)  Post-op Pain: none  Post-op Assessment: Post-op Vital signs reviewed, Patient's Cardiovascular Status Stable, Respiratory Function Stable, Patent Airway, No signs of Nausea or vomiting and Pain level controlled  Post-op Vital Signs: stable  Complications: No apparent anesthesia complications

## 2013-01-25 NOTE — Progress Notes (Signed)
Went into patient room to check on patient. Found sheets/gown/bed saturated with green liquid stool. Dressing completely saturated. Colostomy site and bag intact, no leak noted. Dr. Lindie Spruce and Dr. Janee Morn notified. Dr Janee Morn coming to bedside to assess patient. Thresa Ross RN
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
## 2013-01-25 NOTE — Anesthesia Procedure Notes (Addendum)
Procedure Name: Intubation Date/Time: 01/25/2013 7:59 AM Performed by: Gayla Medicus Pre-anesthesia Checklist: Patient identified, Timeout performed, Emergency Drugs available, Suction available and Patient being monitored Patient Re-evaluated:Patient Re-evaluated prior to inductionOxygen Delivery Method: Circle system utilized Preoxygenation: Pre-oxygenation with 100% oxygen Intubation Type: IV induction, Cricoid Pressure applied and Rapid sequence Laryngoscope Size: Mac and 4 Grade View: Grade I Tube type: Subglottic suction tube Tube size: 7.5 mm Number of attempts: 1 Airway Equipment and Method: Stylet Secured at: 24 cm Tube secured with: Tape Dental Injury: Teeth and Oropharynx as per pre-operative assessment     CVP: Timeout, sterile prep, drape, FBP R neck.  Trendelenburg position.  Finder and trocar RIJ 1st pass with US guidance.  2 lumen placed over J wire. Biopatch and sterile dressing on.  Patient tolerated well.  VSS.  Sandford Craze, MD  08:00-08:10
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
## 2013-01-25 NOTE — Preoperative (Signed)
Beta Blockers   Reason not to administer Beta Blockers:Not Applicable 
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
## 2013-01-25 NOTE — Progress Notes (Signed)
MD is aware that pt requires cuff pressure of 80 to maintain minimal leak
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
## 2013-01-25 NOTE — Consult Note (Signed)
WOC attempted first post-op visit today, however pt. Back to surgery for emergent exploratory abdomen.  Met with legal guardian-Grandparent in the waiting room to explain the role of the WOC nurse.  Will follow up with pt and family Monday.  WOC will follow along for ostomy teaching and care  [employer] RN,CWOCN [phone]

## 2013-01-26 ENCOUNTER — Inpatient Hospital Stay (HOSPITAL_COMMUNITY): Payer: 59

## 2013-01-26 DIAGNOSIS — D62 Acute posthemorrhagic anemia: Secondary | ICD-10-CM

## 2013-01-26 DIAGNOSIS — N170 Acute kidney failure with tubular necrosis: Secondary | ICD-10-CM | POA: Diagnosis not present

## 2013-01-26 DIAGNOSIS — A419 Sepsis, unspecified organism: Secondary | ICD-10-CM | POA: Diagnosis not present

## 2013-01-26 DIAGNOSIS — N289 Disorder of kidney and ureter, unspecified: Secondary | ICD-10-CM

## 2013-01-26 DIAGNOSIS — J9600 Acute respiratory failure, unspecified whether with hypoxia or hypercapnia: Secondary | ICD-10-CM

## 2013-01-26 DIAGNOSIS — K631 Perforation of intestine (nontraumatic): Secondary | ICD-10-CM | POA: Diagnosis not present

## 2013-01-26 LAB — CBC
MCV: 79.4 fL (ref 78.0–100.0)
Platelets: 547 10*3/uL — ABNORMAL HIGH (ref 150–400)
RBC: 3.45 MIL/uL — ABNORMAL LOW (ref 4.22–5.81)
WBC: 32.6 10*3/uL — ABNORMAL HIGH (ref 4.0–10.5)

## 2013-01-26 LAB — CULTURE, BLOOD (ROUTINE X 2)

## 2013-01-26 LAB — URINALYSIS, ROUTINE W REFLEX MICROSCOPIC
Glucose, UA: NEGATIVE mg/dL
Ketones, ur: NEGATIVE mg/dL
Leukocytes, UA: NEGATIVE
Specific Gravity, Urine: 1.019 (ref 1.005–1.030)
pH: 5.5 (ref 5.0–8.0)

## 2013-01-26 LAB — URINE MICROSCOPIC-ADD ON

## 2013-01-26 LAB — POCT I-STAT 3, ART BLOOD GAS (G3+)
O2 Saturation: 96 %
TCO2: 24 mmol/L (ref 0–100)
pCO2 arterial: 40 mmHg (ref 35.0–45.0)
pO2, Arterial: 87 mmHg (ref 80.0–100.0)

## 2013-01-26 LAB — BASIC METABOLIC PANEL
CO2: 22 mEq/L (ref 19–32)
Chloride: 105 mEq/L (ref 96–112)
Potassium: 4.4 mEq/L (ref 3.5–5.1)
Sodium: 137 mEq/L (ref 135–145)

## 2013-01-26 MED ORDER — VANCOMYCIN HCL 10 G IV SOLR
1500.0000 mg | INTRAVENOUS | Status: DC
  Administered 2013-01-26 – 2013-01-28 (×3): 1500 mg via INTRAVENOUS
  Filled 2013-01-26 (×4): qty 1500

## 2013-01-26 MED ORDER — CHLORHEXIDINE GLUCONATE 0.12 % MT SOLN
15.0000 mL | Freq: Two times a day (BID) | OROMUCOSAL | Status: DC
  Administered 2013-01-26 – 2013-02-03 (×18): 15 mL via OROMUCOSAL
  Filled 2013-01-26 (×17): qty 15

## 2013-01-26 MED ORDER — POTASSIUM CHLORIDE IN NACL 20-0.9 MEQ/L-% IV SOLN
INTRAVENOUS | Status: DC
  Administered 2013-01-27 – 2013-02-01 (×6): via INTRAVENOUS
  Filled 2013-01-26 (×15): qty 1000

## 2013-01-26 MED ORDER — TRACE MINERALS CR-CU-F-FE-I-MN-MO-SE-ZN IV SOLN
INTRAVENOUS | Status: AC
  Administered 2013-01-26: 18:00:00 via INTRAVENOUS
  Filled 2013-01-26: qty 1000

## 2013-01-26 MED ORDER — INSULIN ASPART 100 UNIT/ML ~~LOC~~ SOLN
0.0000 [IU] | Freq: Four times a day (QID) | SUBCUTANEOUS | Status: DC
  Administered 2013-01-27: 1 [IU] via SUBCUTANEOUS
  Administered 2013-01-28: 2 [IU] via SUBCUTANEOUS
  Administered 2013-01-29 – 2013-01-31 (×3): 1 [IU] via SUBCUTANEOUS

## 2013-01-26 MED ORDER — SODIUM CHLORIDE 0.9 % IV SOLN
500.0000 mg | Freq: Three times a day (TID) | INTRAVENOUS | Status: DC
  Administered 2013-01-26 – 2013-01-29 (×10): 500 mg via INTRAVENOUS
  Filled 2013-01-26 (×12): qty 500

## 2013-01-26 MED ORDER — BIOTENE DRY MOUTH MT LIQD
15.0000 mL | Freq: Four times a day (QID) | OROMUCOSAL | Status: DC
  Administered 2013-01-26 – 2013-02-04 (×36): 15 mL via OROMUCOSAL

## 2013-01-26 MED ORDER — FAT EMULSION 20 % IV EMUL
250.0000 mL | INTRAVENOUS | Status: DC
  Filled 2013-01-26: qty 250

## 2013-01-26 NOTE — Progress Notes (Signed)
ETT advanced 2 cm to 27 at lip- per am cxray and Dr. Delford Field at bedside.
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
## 2013-01-26 NOTE — Progress Notes (Signed)
1 Day Post-Op  Subjective: Febrile to 102; Cr bumped, increased WBC  Objective: Vital signs in last 24 hours: Temp:  [97.4 F (36.3 C)-102.4 F (39.1 C)] 99.6 F (37.6 C) (06/21 0804) Pulse Rate:  [111-147] 111 (06/21 0811) Resp:  [15-26] 17 (06/21 0811) BP: (83-161)/(36-89) 155/71 mmHg (06/21 0811) SpO2:  [96 %-100 %] 100 % (06/21 0811) Arterial Line BP: (93-161)/(55-77) 138/71 mmHg (06/21 0700) FiO2 (%):  [29.8 %-50.3 %] 30.1 % (06/21 0813) Last BM Date: 01/25/13  Intake/Output from previous day: 06/20 0701 - 06/21 0700 In: 6036.3 [I.V.:5381.3; NG/GT:30; IV Piggyback:550] Out: 2570 [Urine:1190; Emesis/NG output:250; Drains:1080; Blood:50] Intake/Output this shift:    Intubated, sedated. Will open eye to voice Coarse bs b/l Tachy Distended, soft, open abd wound vac in place -serosang drainage in canister.  No peripheral edema  Lab Results:   Recent Labs  01/25/13 0625 01/26/13 0437  WBC 23.2* 32.6*  HGB 9.6* 9.6*  HCT 27.3* 27.4*  PLT 525* 547*   BMET  Recent Labs  01/25/13 0625 01/26/13 0437  NA 137 137  K 4.1 4.4  CL 104 105  CO2 24 22  GLUCOSE 122* 127*  BUN 12 31*  CREATININE 0.93 2.51*  CALCIUM 8.0* 7.9*   PT/INR No results found for this basename: LABPROT, INR,  in the last 72 hours ABG  Recent Labs  01/25/13 1007 01/26/13 0429  PHART 7.408 7.365  HCO3 24.9* 22.6    Studies/Results: Dg Chest Port 1 View  01/26/2013   *RADIOLOGY REPORT*  Clinical Data: Respiratory failure  PORTABLE CHEST - 1 VIEW  Comparison: 01/25/2013  Findings: There is hazy perihilar airspace opacity that extends to the lung bases where the hemidiaphragms are obscured.  This has the appearance of pulmonary edema with associated pleural effusions. It has increased from the prior study.  No pneumothorax.  Endotracheal tube tip now lies 5 cm above carina.  Right internal jugular central venous line and nasogastric tube are stable well-positioned.  IMPRESSION: Worsened lung  aeration with an increase in the size of the pleural effusions and new perihilar and lower lung zone airspace opacity that is likely pulmonary edema.  Support apparatus remains well positioned.   Original Report Authenticated By: Amie Portland, M.D.   Dg Chest Port 1 View  01/25/2013   *RADIOLOGY REPORT*  Clinical Data: Status post surgery, check support apparatus  PORTABLE CHEST - 1 VIEW  Comparison: 01/22/2013  Findings: The cardiac shadow is stable.  The lungs are well- aerated.  A small right-sided pleural effusion is now seen.  The right pneumothorax appears to have resolved in the interval.  Right- sided jugular line is noted with catheter tip in the distal superior vena cava.  The endotracheal tube is within normal limits at the level of the aortic knob.  A nasogastric catheter is seen within the stomach.  IMPRESSION: Tubes and lines as described above.  New right-sided pleural effusion   Original Report Authenticated By: Alcide Clever, M.D.    Anti-infectives: Anti-infectives   Start     Dose/Rate Route Frequency Ordered Stop   01/26/13 0845  imipenem-cilastatin (PRIMAXIN) 500 mg in sodium chloride 0.9 % 100 mL IVPB     500 mg 200 mL/hr over 30 Minutes Intravenous 3 times per day 01/26/13 0830     01/26/13 0845  vancomycin (VANCOCIN) 1,500 mg in sodium chloride 0.9 % 500 mL IVPB     1,500 mg 250 mL/hr over 120 Minutes Intravenous Every 24 hours 01/26/13 0830  01/25/13 1500  micafungin (MYCAMINE) 100 mg in sodium chloride 0.9 % 100 mL IVPB     100 mg 100 mL/hr over 1 Hours Intravenous Daily 01/25/13 1317     01/25/13 0746  vancomycin (VANCOCIN) 1 GM/200ML IVPB    Comments:  HYPES, KAREN: cabinet override      01/25/13 0746 01/25/13 0800   01/23/13 2330  vancomycin (VANCOCIN) IVPB 1000 mg/200 mL premix  Status:  Discontinued     1,000 mg 200 mL/hr over 60 Minutes Intravenous Every 8 hours 01/23/13 1716 01/25/13 1317   01/20/13 1800  piperacillin-tazobactam (ZOSYN) IVPB 3.375 g  Status:   Discontinued     3.375 g 12.5 mL/hr over 240 Minutes Intravenous Every 8 hours 01/20/13 1109 01/26/13 0805   01/20/13 1600  vancomycin (VANCOCIN) IVPB 1000 mg/200 mL premix  Status:  Discontinued     1,000 mg 200 mL/hr over 60 Minutes Intravenous Every 12 hours 01/20/13 0219 01/23/13 1716   01/20/13 1200  piperacillin-tazobactam (ZOSYN) IVPB 3.375 g     3.375 g 100 mL/hr over 30 Minutes Intravenous  Once 01/20/13 1109 01/20/13 1229   01/20/13 0300  ceFEPIme (MAXIPIME) 2 g in dextrose 5 % 50 mL IVPB  Status:  Discontinued     2 g 100 mL/hr over 30 Minutes Intravenous 3 times per day 01/20/13 0205 01/20/13 1029   01/20/13 0300  clindamycin (CLEOCIN) IVPB 900 mg  Status:  Discontinued     900 mg 100 mL/hr over 30 Minutes Intravenous 3 times per day 01/20/13 0205 01/20/13 1029   01/20/13 0300  vancomycin (VANCOCIN) 2,000 mg in sodium chloride 0.9 % 500 mL IVPB     2,000 mg 250 mL/hr over 120 Minutes Intravenous  Once 01/20/13 0219 01/20/13 0509   01/20/13 0215  vancomycin (VANCOCIN) IVPB 1000 mg/200 mL premix  Status:  Discontinued     1,000 mg 200 mL/hr over 60 Minutes Intravenous Every 12 hours 01/20/13 0205 01/20/13 0211      Assessment/Plan: s/p Procedure(s): EXPLORATORY LAPAROTOMY (N/A) SMALL BOWEL RESECTION, RESECTION OF ILEOSTOMY, REPAIR OF SMALL BOWEL TIMES ONE. APPLICATION OF WOUND VAC 01/25/13  S/p exp lap, Hepatorahaphy; Placement of chest tube; Repair of diaphragm 01/17/13 S/p Reopening of recent laparotomy; RIGHT hemicolectomy with ileostomy 01/24/13  Neuro/pulm - keep on vent with open abd. cxr shows worsening effusions/edema. Oxy/vent well. Appreciate ccm seeing. May some lasix but will hold given Cr increased CV- HD stable. hgb stable. Sinus tachy GI - NO ENTERAL FEEDS - pt's intestinal tract is in discontinuity.  Renal - Acute renal insufficiency; probably multifactorial, ATN.  Will ask pharm to renal meds. Good uop. Check UA.  ID - febrile, increased wbc, had a lot of  contamination in abd yesterday so not surprised. Agree with broadening abx.  F/E/N- cont mIVF. Lytes ok. Nutrition - can start TPN. NG to LIWS  Plan washout in OR probably Sunday.   Mary Sella. Andrey Campanile, MD, FACS General, Bariatric, & Minimally Invasive Surgery [facility], [place]   LOS: 9 days    Atilano Ina 01/26/2013
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
## 2013-01-27 ENCOUNTER — Inpatient Hospital Stay (HOSPITAL_COMMUNITY): Payer: 59

## 2013-01-27 DIAGNOSIS — R6520 Severe sepsis without septic shock: Secondary | ICD-10-CM

## 2013-01-27 DIAGNOSIS — A419 Sepsis, unspecified organism: Secondary | ICD-10-CM

## 2013-01-27 LAB — COMPREHENSIVE METABOLIC PANEL
ALT: 31 U/L (ref 0–53)
AST: 76 U/L — ABNORMAL HIGH (ref 0–37)
CO2: 24 mEq/L (ref 19–32)
Calcium: 7.4 mg/dL — ABNORMAL LOW (ref 8.4–10.5)
Chloride: 109 mEq/L (ref 96–112)
GFR calc Af Amer: 39 mL/min — ABNORMAL LOW (ref >90)
GFR calc non Af Amer: 33 mL/min — ABNORMAL LOW (ref >90)
Glucose, Bld: 122 mg/dL — ABNORMAL HIGH (ref 70–99)
Sodium: 139 mEq/L (ref 135–145)
Total Bilirubin: 0.9 mg/dL (ref 0.3–1.2)

## 2013-01-27 LAB — CBC
HCT: 20.9 % — ABNORMAL LOW (ref 39.0–52.0)
Hemoglobin: 7.2 g/dL — ABNORMAL LOW (ref 13.0–17.0)
MCH: 27.9 pg (ref 26.0–34.0)
MCHC: 34.4 g/dL (ref 30.0–36.0)
MCV: 81 fL (ref 78.0–100.0)
RDW: 15 % (ref 11.5–15.5)

## 2013-01-27 LAB — GLUCOSE, CAPILLARY: Glucose-Capillary: 106 mg/dL — ABNORMAL HIGH (ref 70–99)

## 2013-01-27 LAB — URINE CULTURE: Colony Count: NO GROWTH

## 2013-01-27 LAB — DIFFERENTIAL
Basophils Absolute: 0.3 10*3/uL — ABNORMAL HIGH (ref 0.0–0.1)
Basophils Relative: 1 % (ref 0–1)
Eosinophils Absolute: 0.3 10*3/uL (ref 0.0–0.7)
Lymphs Abs: 2.4 10*3/uL (ref 0.7–4.0)
Monocytes Absolute: 1.9 10*3/uL — ABNORMAL HIGH (ref 0.1–1.0)
Neutro Abs: 21.6 10*3/uL — ABNORMAL HIGH (ref 1.7–7.7)

## 2013-01-27 LAB — TRIGLYCERIDES: Triglycerides: 483 mg/dL — ABNORMAL HIGH (ref <150)

## 2013-01-27 MED ORDER — TRACE MINERALS CR-CU-F-FE-I-MN-MO-SE-ZN IV SOLN
INTRAVENOUS | Status: AC
  Administered 2013-01-27: 17:00:00 via INTRAVENOUS
  Filled 2013-01-27: qty 2000

## 2013-01-27 NOTE — Progress Notes (Signed)
Fentanyl gtt bag changed. 25ml waste, down sink. Edison Pace RN witness waste
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
## 2013-01-28 ENCOUNTER — Encounter (HOSPITAL_COMMUNITY): Payer: Self-pay | Admitting: *Deleted

## 2013-01-28 ENCOUNTER — Inpatient Hospital Stay (HOSPITAL_COMMUNITY): Payer: 59 | Admitting: *Deleted

## 2013-01-28 ENCOUNTER — Encounter (HOSPITAL_COMMUNITY): Admission: EM | Disposition: A | Discharge status: Rehabilitation Facility | Payer: Self-pay | Source: Home / Self Care

## 2013-01-28 ENCOUNTER — Inpatient Hospital Stay (HOSPITAL_COMMUNITY): Payer: 59

## 2013-01-28 HISTORY — PX: ILEOSTOMY: SHX1783

## 2013-01-28 HISTORY — PX: LAPAROTOMY: SHX154

## 2013-01-28 HISTORY — PX: VACUUM ASSISTED CLOSURE CHANGE: SHX5227

## 2013-01-28 LAB — GLUCOSE, CAPILLARY
Glucose-Capillary: 86 mg/dL (ref 70–99)
Glucose-Capillary: 99 mg/dL (ref 70–99)

## 2013-01-28 LAB — DIFFERENTIAL
Basophils Relative: 1 % (ref 0–1)
Eosinophils Relative: 2 % (ref 0–5)
Lymphs Abs: 2.2 10*3/uL (ref 0.7–4.0)
Monocytes Absolute: 1.6 10*3/uL — ABNORMAL HIGH (ref 0.1–1.0)

## 2013-01-28 LAB — CBC
Hemoglobin: 6.7 g/dL — CL (ref 13.0–17.0)
MCH: 29.1 pg (ref 26.0–34.0)
MCHC: 35.4 g/dL (ref 30.0–36.0)
MCV: 82.2 fL (ref 78.0–100.0)
Platelets: 591 10*3/uL — ABNORMAL HIGH (ref 150–400)
Platelets: 582 10*3/uL — ABNORMAL HIGH (ref 150–400)
RBC: 2.3 MIL/uL — ABNORMAL LOW (ref 4.22–5.81)
RBC: 3.52 MIL/uL — ABNORMAL LOW (ref 4.22–5.81)
WBC: 24 10*3/uL — ABNORMAL HIGH (ref 4.0–10.5)

## 2013-01-28 LAB — COMPREHENSIVE METABOLIC PANEL
ALT: 26 U/L (ref 0–53)
Alkaline Phosphatase: 88 U/L (ref 39–117)
Chloride: 116 mEq/L — ABNORMAL HIGH (ref 96–112)
GFR calc Af Amer: 44 mL/min — ABNORMAL LOW (ref >90)
Glucose, Bld: 118 mg/dL — ABNORMAL HIGH (ref 70–99)
Potassium: 4 mEq/L (ref 3.5–5.1)
Sodium: 144 mEq/L (ref 135–145)
Total Bilirubin: 1.2 mg/dL (ref 0.3–1.2)
Total Protein: 5.1 g/dL — ABNORMAL LOW (ref 6.0–8.3)

## 2013-01-28 LAB — TRIGLYCERIDES: Triglycerides: 466 mg/dL — ABNORMAL HIGH (ref <150)

## 2013-01-28 SURGERY — LAPAROTOMY, EXPLORATORY
Anesthesia: General | Site: Abdomen | Wound class: Dirty or Infected

## 2013-01-28 MED ORDER — PROPOFOL 10 MG/ML IV EMUL
5.0000 ug/kg/min | INTRAVENOUS | Status: DC
  Administered 2013-01-28: 50 ug/kg/min via INTRAVENOUS
  Administered 2013-01-29: 49.908 ug/kg/min via INTRAVENOUS
  Administered 2013-01-29 (×3): 50 ug/kg/min via INTRAVENOUS
  Administered 2013-01-29: 49.908 ug/kg/min via INTRAVENOUS
  Administered 2013-01-29: 50 ug/kg/min via INTRAVENOUS
  Administered 2013-01-29: 49.908 ug/kg/min via INTRAVENOUS
  Administered 2013-01-30 (×2): 50 ug/kg/min via INTRAVENOUS
  Administered 2013-01-30: 25 ug/kg/min via INTRAVENOUS
  Administered 2013-01-30 – 2013-01-31 (×3): 50 ug/kg/min via INTRAVENOUS
  Administered 2013-01-31 (×3): 25 ug/kg/min via INTRAVENOUS
  Administered 2013-02-01: 10 ug/kg/min via INTRAVENOUS
  Filled 2013-01-28 (×13): qty 100
  Filled 2013-01-28: qty 200
  Filled 2013-01-28 (×7): qty 100

## 2013-01-28 MED ORDER — TRACE MINERALS CR-CU-F-FE-I-MN-MO-SE-ZN IV SOLN
INTRAVENOUS | Status: AC
  Administered 2013-01-28: 17:00:00 via INTRAVENOUS
  Filled 2013-01-28: qty 2400

## 2013-01-28 MED ORDER — MIDAZOLAM HCL 5 MG/5ML IJ SOLN
INTRAMUSCULAR | Status: DC | PRN
  Administered 2013-01-28: 2 mg via INTRAVENOUS

## 2013-01-28 MED ORDER — PROPOFOL 10 MG/ML IV BOLUS
INTRAVENOUS | Status: DC | PRN
  Administered 2013-01-28 (×2): 50 mg via INTRAVENOUS

## 2013-01-28 MED ORDER — FENTANYL CITRATE 0.05 MG/ML IJ SOLN
INTRAMUSCULAR | Status: DC | PRN
  Administered 2013-01-28: 100 ug via INTRAVENOUS
  Administered 2013-01-28 (×3): 250 ug via INTRAVENOUS
  Administered 2013-01-28: 150 ug via INTRAVENOUS

## 2013-01-28 MED ORDER — LACTATED RINGERS IV SOLN
INTRAVENOUS | Status: DC | PRN
  Administered 2013-01-28: 10:00:00 via INTRAVENOUS

## 2013-01-28 MED ORDER — ROCURONIUM BROMIDE 100 MG/10ML IV SOLN
INTRAVENOUS | Status: DC | PRN
  Administered 2013-01-28 (×2): 50 mg via INTRAVENOUS

## 2013-01-28 MED ORDER — VANCOMYCIN HCL IN DEXTROSE 750-5 MG/150ML-% IV SOLN
750.0000 mg | Freq: Two times a day (BID) | INTRAVENOUS | Status: DC
  Administered 2013-01-29: 750 mg via INTRAVENOUS
  Filled 2013-01-28 (×2): qty 150

## 2013-01-28 MED ORDER — HYDROMORPHONE HCL PF 1 MG/ML IJ SOLN
INTRAMUSCULAR | Status: DC | PRN
  Administered 2013-01-28: 1 mg via INTRAVENOUS

## 2013-01-28 MED ORDER — 0.9 % SODIUM CHLORIDE (POUR BTL) OPTIME
TOPICAL | Status: DC | PRN
  Administered 2013-01-28: 2000 mL
  Administered 2013-01-28: 1000 mL

## 2013-01-28 SURGICAL SUPPLY — 47 items
BLADE SURG ROTATE 9660 (MISCELLANEOUS) IMPLANT
CANISTER SUCTION 2500CC (MISCELLANEOUS) ×2 IMPLANT
CANISTER WOUND CARE 500ML ATS (WOUND CARE) ×2 IMPLANT
CHLORAPREP W/TINT 26ML (MISCELLANEOUS) ×2 IMPLANT
CLOTH BEACON ORANGE TIMEOUT ST (SAFETY) ×2 IMPLANT
COVER SURGICAL LIGHT HANDLE (MISCELLANEOUS) ×2 IMPLANT
DRAPE LAPAROSCOPIC ABDOMINAL (DRAPES) ×2 IMPLANT
DRAPE UTILITY 15X26 W/TAPE STR (DRAPE) ×4 IMPLANT
DRAPE WARM FLUID 44X44 (DRAPE) ×2 IMPLANT
ELECT BLADE 6.5 EXT (BLADE) ×2 IMPLANT
ELECT CAUTERY BLADE 6.4 (BLADE) ×2 IMPLANT
ELECT REM PT RETURN 9FT ADLT (ELECTROSURGICAL) ×2
ELECTRODE REM PT RTRN 9FT ADLT (ELECTROSURGICAL) ×1 IMPLANT
GLOVE BIO SURGEON STRL SZ7.5 (GLOVE) ×2 IMPLANT
GLOVE BIO SURGEON STRL SZ8 (GLOVE) ×2 IMPLANT
GLOVE BIOGEL PI IND STRL 7.0 (GLOVE) ×1 IMPLANT
GLOVE BIOGEL PI IND STRL 8 (GLOVE) ×1 IMPLANT
GLOVE BIOGEL PI INDICATOR 7.0 (GLOVE) ×1
GLOVE BIOGEL PI INDICATOR 8 (GLOVE) ×1
GLOVE SURG SS PI 7.0 STRL IVOR (GLOVE) ×2 IMPLANT
GOWN STRL NON-REIN LRG LVL3 (GOWN DISPOSABLE) ×4 IMPLANT
GOWN STRL REIN XL XLG (GOWN DISPOSABLE) ×2 IMPLANT
KIT BASIN OR (CUSTOM PROCEDURE TRAY) ×2 IMPLANT
KIT OSTOMY DRAINABLE 2.75 STR (WOUND CARE) ×2 IMPLANT
KIT ROOM TURNOVER OR (KITS) ×2 IMPLANT
LIGASURE IMPACT 36 18CM CVD LR (INSTRUMENTS) ×2 IMPLANT
NS IRRIG 1000ML POUR BTL (IV SOLUTION) ×4 IMPLANT
PACK GENERAL/GYN (CUSTOM PROCEDURE TRAY) ×2 IMPLANT
PAD ARMBOARD 7.5X6 YLW CONV (MISCELLANEOUS) ×2 IMPLANT
RELOAD PROXIMATE 75MM BLUE (ENDOMECHANICALS) ×4 IMPLANT
SPECIMEN JAR LARGE (MISCELLANEOUS) IMPLANT
SPONGE ABDOMINAL VAC ABTHERA (MISCELLANEOUS) ×2 IMPLANT
SPONGE LAP 18X18 X RAY DECT (DISPOSABLE) ×4 IMPLANT
STAPLER PROXIMATE 75MM BLUE (STAPLE) ×2 IMPLANT
STAPLER VISISTAT 35W (STAPLE) ×2 IMPLANT
SUCTION POOLE TIP (SUCTIONS) ×2 IMPLANT
SUT NOVA 1 T20/GS 25DT (SUTURE) ×8 IMPLANT
SUT PDS AB 1 TP1 96 (SUTURE) IMPLANT
SUT SILK 2 0 SH CR/8 (SUTURE) ×2 IMPLANT
SUT SILK 2 0 TIES 10X30 (SUTURE) ×2 IMPLANT
SUT SILK 3 0 SH CR/8 (SUTURE) ×2 IMPLANT
SUT SILK 3 0 TIES 10X30 (SUTURE) ×2 IMPLANT
SUT VIC AB 3-0 SH 18 (SUTURE) ×2 IMPLANT
TOWEL OR 17X26 10 PK STRL BLUE (TOWEL DISPOSABLE) ×2 IMPLANT
TRAY FOLEY CATH 14FRSI W/METER (CATHETERS) IMPLANT
WATER STERILE IRR 1000ML POUR (IV SOLUTION) IMPLANT
YANKAUER SUCT BULB TIP NO VENT (SUCTIONS) IMPLANT

## 2013-01-28 NOTE — Progress Notes (Signed)
This note also relates to the following rows which could not be included: BP - Cannot attach notes to unvalidated device data MAP (mmHg) - Cannot attach notes to unvalidated device data Pulse Rate - Cannot attach notes to unvalidated device data ECG Heart Rate - Cannot attach notes to unvalidated device data Resp - Cannot attach notes to unvalidated device data SpO2 - Cannot attach notes to unvalidated device data FiO2 (%) - Cannot attach notes to unvalidated device data Arterial Line BP - Cannot attach notes to unvalidated device data CVP (mmHg) - Cannot attach notes to unvalidated device data Vt (observed, mL) - Cannot attach notes to unvalidated device data Pt temp up.  Pt bathed, tylenol suppository given and cooling blanket applied in order to bring temp down.  Will continue to monitor.
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
## 2013-01-28 NOTE — Preoperative (Signed)
Beta Blockers   Reason not to administer Beta Blockers:Not Applicable 

## 2013-01-28 NOTE — Progress Notes (Signed)
Patient ID: Dakota Evans, male   DOB: [birth year], 27 y.o.   MRN: [id number] I spoke to his mother and grandfather on the phone regarding surgery today including the procedure/risks/and benefits.  They agree. Violeta Gelinas, MD, MPH, FACS Pager: [phone]

## 2013-01-28 NOTE — Transfer of Care (Signed)
Immediate Anesthesia Transfer of Care Note  Patient: Dakota Evans  Procedure(s) Performed: Procedure(s): EXPLORATORY LAPAROTOMY (N/A) ILEOSTOMY (N/A) ABDOMINAL VACUUM ASSISTED PARTIAL CLOSURE CHANGE (N/A)  Patient Location: SICU  Anesthesia Type:General  Level of Consciousness: sedated and Patient remains intubated per anesthesia plan  Airway & Oxygen Therapy: Patient remains intubated per anesthesia plan and Patient placed on Ventilator (see vital sign flow sheet for setting)  Post-op Assessment: Report given to PACU RN and Post -op Vital signs reviewed and stable  Post vital signs: Reviewed and stable  Complications: No apparent anesthesia complications

## 2013-01-28 NOTE — Progress Notes (Signed)
CRITICAL VALUE ALERT  Critical value received:  Hgb 6.7  Date of notification:  01/28/2013  Time of notification:  0600  Critical value read back:yes  Nurse who received alert:  Fatima Sanger  MD notified (1st page):  Dr. Derrell Lolling  Time of first page:  0600  MD notified (2nd page):  Time of second page:  Responding MD:    Time MD responded:
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
## 2013-01-28 NOTE — Progress Notes (Signed)
OT Cancellation Note  Patient Details Name: Dakota Evans MRN: [id number] DOB: 1986-02-07   Cancelled Treatment:    Reason Eval/Treat Not Completed: Patient at procedure or test/ unavailable--patient in OR.  Evette Georges [phone] 01/28/2013, 10:58 AM
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
## 2013-01-29 ENCOUNTER — Encounter (HOSPITAL_COMMUNITY): Payer: Self-pay | Admitting: General Surgery

## 2013-01-29 DIAGNOSIS — S065X9A Traumatic subdural hemorrhage with loss of consciousness of unspecified duration, initial encounter: Secondary | ICD-10-CM

## 2013-01-29 DIAGNOSIS — S065XAA Traumatic subdural hemorrhage with loss of consciousness status unknown, initial encounter: Secondary | ICD-10-CM

## 2013-01-29 DIAGNOSIS — S066XAA Traumatic subarachnoid hemorrhage with loss of consciousness status unknown, initial encounter: Secondary | ICD-10-CM

## 2013-01-29 DIAGNOSIS — S066X9A Traumatic subarachnoid hemorrhage with loss of consciousness of unspecified duration, initial encounter: Secondary | ICD-10-CM

## 2013-01-29 DIAGNOSIS — D62 Acute posthemorrhagic anemia: Secondary | ICD-10-CM

## 2013-01-29 DIAGNOSIS — N289 Disorder of kidney and ureter, unspecified: Secondary | ICD-10-CM

## 2013-01-29 LAB — TYPE AND SCREEN
Antibody Screen: NEGATIVE
Unit division: 0
Unit division: 0

## 2013-01-29 LAB — GLUCOSE, CAPILLARY
Glucose-Capillary: 108 mg/dL — ABNORMAL HIGH (ref 70–99)
Glucose-Capillary: 111 mg/dL — ABNORMAL HIGH (ref 70–99)
Glucose-Capillary: 111 mg/dL — ABNORMAL HIGH (ref 70–99)

## 2013-01-29 LAB — POCT I-STAT 4, (NA,K, GLUC, HGB,HCT)
Glucose, Bld: 134 mg/dL — ABNORMAL HIGH (ref 70–99)
HCT: 27 % — ABNORMAL LOW (ref 39.0–52.0)
Potassium: 4.3 mEq/L (ref 3.5–5.1)
Sodium: 144 mEq/L (ref 135–145)

## 2013-01-29 LAB — CBC
HCT: 28 % — ABNORMAL LOW (ref 39.0–52.0)
Hemoglobin: 9.7 g/dL — ABNORMAL LOW (ref 13.0–17.0)
RDW: 15.3 % (ref 11.5–15.5)
WBC: 25.7 10*3/uL — ABNORMAL HIGH (ref 4.0–10.5)

## 2013-01-29 LAB — BASIC METABOLIC PANEL
BUN: 26 mg/dL — ABNORMAL HIGH (ref 6–23)
Chloride: 113 mEq/L — ABNORMAL HIGH (ref 96–112)
GFR calc Af Amer: 55 mL/min — ABNORMAL LOW (ref >90)
Glucose, Bld: 137 mg/dL — ABNORMAL HIGH (ref 70–99)
Potassium: 4.9 mEq/L (ref 3.5–5.1)

## 2013-01-29 LAB — PHOSPHORUS: Phosphorus: 4.7 mg/dL — ABNORMAL HIGH (ref 2.3–4.6)

## 2013-01-29 MED ORDER — TRACE MINERALS CR-CU-F-FE-I-MN-MO-SE-ZN IV SOLN
INTRAVENOUS | Status: AC
  Administered 2013-01-29: 18:00:00 via INTRAVENOUS
  Filled 2013-01-29: qty 2400

## 2013-01-29 MED ORDER — SODIUM CHLORIDE 0.9 % IV SOLN
500.0000 mg | Freq: Four times a day (QID) | INTRAVENOUS | Status: DC
  Administered 2013-01-29 – 2013-01-31 (×8): 500 mg via INTRAVENOUS
  Filled 2013-01-29 (×10): qty 500

## 2013-01-29 MED ORDER — VANCOMYCIN HCL IN DEXTROSE 1-5 GM/200ML-% IV SOLN
1000.0000 mg | Freq: Two times a day (BID) | INTRAVENOUS | Status: DC
  Administered 2013-01-29 – 2013-01-30 (×3): 1000 mg via INTRAVENOUS
  Filled 2013-01-29 (×4): qty 200
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
## 2013-01-29 NOTE — Progress Notes (Signed)
Cooling blanket on monitoring mode only as temp is now 99.0 rectally.  Will continue to monitor and if pt temp increases will resume cooling.
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
## 2013-01-29 NOTE — Consult Note (Signed)
WOC ostomy follow up Stoma type/location: RLQ, end ileostomy.  Pt sedated on the vent.  New ileostomy.  Plans for repeat trip back to OR soon for abdominal wound.  With AbdVAC in place now.  Stomal assessment/size: 1 3/4" round, budded, pink and moist Peristomal assessment: pouch intact at this time Treatment options for stomal/peristomal skin: none Output bloody Ostomy pouching: 2pc. 2 1/4" in place will order supplies for the bedside and begin teaching with pt and family when appropriate   Noted from bedside nursing staff to have device related pressure ulcer on the left nare, It does appear partial thickness, will apply antibiotic ointment to keep soft at this point and monitor.   WOC team will follow along with you for wound and ostomy support Dakota Evans Eliberto Ivory RN,CWOCN [phone]

## 2013-01-29 NOTE — Progress Notes (Signed)
Occupational Therapy Discharge Patient Details Name: Isador Castille MRN: [id number] DOB: 11-17-1985 Today's Date: 01/29/2013 Time:  -     Patient discharged from OT services secondary to surgery - will need to re-order OT to resume therapy services.            Evette Georges [phone] 01/29/2013, 10:30 AM
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
## 2013-01-30 ENCOUNTER — Encounter (HOSPITAL_COMMUNITY): Admission: EM | Disposition: A | Discharge status: Rehabilitation Facility | Payer: Self-pay | Source: Home / Self Care

## 2013-01-30 ENCOUNTER — Encounter (HOSPITAL_COMMUNITY): Payer: Self-pay | Admitting: Anesthesiology

## 2013-01-30 ENCOUNTER — Inpatient Hospital Stay (HOSPITAL_COMMUNITY): Payer: 59 | Admitting: Anesthesiology

## 2013-01-30 ENCOUNTER — Inpatient Hospital Stay (HOSPITAL_COMMUNITY): Payer: 59

## 2013-01-30 ENCOUNTER — Encounter (HOSPITAL_COMMUNITY): Payer: Self-pay | Admitting: Certified Registered"

## 2013-01-30 DIAGNOSIS — S31109A Unspecified open wound of abdominal wall, unspecified quadrant without penetration into peritoneal cavity, initial encounter: Secondary | ICD-10-CM

## 2013-01-30 HISTORY — PX: LAPAROTOMY: SHX154

## 2013-01-30 LAB — CBC
HCT: 25.1 % — ABNORMAL LOW (ref 39.0–52.0)
Hemoglobin: 8.7 g/dL — ABNORMAL LOW (ref 13.0–17.0)
MCV: 82.6 fL (ref 78.0–100.0)
RDW: 15.9 % — ABNORMAL HIGH (ref 11.5–15.5)
WBC: 21.5 10*3/uL — ABNORMAL HIGH (ref 4.0–10.5)

## 2013-01-30 LAB — GLUCOSE, CAPILLARY
Glucose-Capillary: 108 mg/dL — ABNORMAL HIGH (ref 70–99)
Glucose-Capillary: 129 mg/dL — ABNORMAL HIGH (ref 70–99)

## 2013-01-30 LAB — BASIC METABOLIC PANEL
BUN: 25 mg/dL — ABNORMAL HIGH (ref 6–23)
CO2: 20 mEq/L (ref 19–32)
Chloride: 112 mEq/L (ref 96–112)
Creatinine, Ser: 2.08 mg/dL — ABNORMAL HIGH (ref 0.50–1.35)
GFR calc Af Amer: 49 mL/min — ABNORMAL LOW (ref >90)
Glucose, Bld: 135 mg/dL — ABNORMAL HIGH (ref 70–99)

## 2013-01-30 SURGERY — LAPAROTOMY, EXPLORATORY
Anesthesia: General | Site: Abdomen | Wound class: Clean Contaminated

## 2013-01-30 MED ORDER — SODIUM CHLORIDE 0.9 % IV SOLN
25.0000 mg | Freq: Four times a day (QID) | INTRAVENOUS | Status: DC | PRN
  Administered 2013-01-31: 25 mg via INTRAVENOUS
  Filled 2013-01-30 (×2): qty 1

## 2013-01-30 MED ORDER — MIDAZOLAM HCL 5 MG/5ML IJ SOLN
INTRAMUSCULAR | Status: DC | PRN
  Administered 2013-01-30: 2 mg via INTRAVENOUS

## 2013-01-30 MED ORDER — LACTATED RINGERS IV SOLN
INTRAVENOUS | Status: DC | PRN
  Administered 2013-01-30: 09:00:00 via INTRAVENOUS

## 2013-01-30 MED ORDER — FENTANYL CITRATE 0.05 MG/ML IJ SOLN
INTRAMUSCULAR | Status: DC | PRN
  Administered 2013-01-30: 150 ug via INTRAVENOUS
  Administered 2013-01-30: 100 ug via INTRAVENOUS

## 2013-01-30 MED ORDER — SODIUM CHLORIDE 0.9 % IJ SOLN
10.0000 mL | INTRAMUSCULAR | Status: DC | PRN
  Administered 2013-01-31: 10 mL
  Filled 2013-01-30: qty 10

## 2013-01-30 MED ORDER — ROCURONIUM BROMIDE 100 MG/10ML IV SOLN
INTRAVENOUS | Status: DC | PRN
  Administered 2013-01-30: 50 mg via INTRAVENOUS
  Administered 2013-01-30: 20 mg via INTRAVENOUS
  Administered 2013-01-30: 30 mg via INTRAVENOUS

## 2013-01-30 MED ORDER — SODIUM CHLORIDE 0.9 % IV SOLN
1.0000 g | Freq: Once | INTRAVENOUS | Status: AC
  Administered 2013-01-30: 1 g via INTRAVENOUS
  Filled 2013-01-30: qty 10

## 2013-01-30 MED ORDER — TRACE MINERALS CR-CU-F-FE-I-MN-MO-SE-ZN IV SOLN
INTRAVENOUS | Status: AC
  Administered 2013-01-30 – 2013-01-31 (×2): via INTRAVENOUS
  Filled 2013-01-30: qty 2400

## 2013-01-30 MED ORDER — 0.9 % SODIUM CHLORIDE (POUR BTL) OPTIME
TOPICAL | Status: DC | PRN
  Administered 2013-01-30: 1000 mL

## 2013-01-30 SURGICAL SUPPLY — 44 items
BANDAGE GAUZE ELAST BULKY 4 IN (GAUZE/BANDAGES/DRESSINGS) ×2 IMPLANT
BLADE SURG ROTATE 9660 (MISCELLANEOUS) IMPLANT
CANISTER SUCTION 2500CC (MISCELLANEOUS) ×2 IMPLANT
CHLORAPREP W/TINT 26ML (MISCELLANEOUS) IMPLANT
CLOTH BEACON ORANGE TIMEOUT ST (SAFETY) ×2 IMPLANT
COVER SURGICAL LIGHT HANDLE (MISCELLANEOUS) ×2 IMPLANT
DRAPE LAPAROSCOPIC ABDOMINAL (DRAPES) ×2 IMPLANT
DRAPE UTILITY 15X26 W/TAPE STR (DRAPE) ×4 IMPLANT
DRAPE WARM FLUID 44X44 (DRAPE) ×2 IMPLANT
DRSG PAD ABDOMINAL 8X10 ST (GAUZE/BANDAGES/DRESSINGS) ×2 IMPLANT
ELECT BLADE 6.5 EXT (BLADE) ×2 IMPLANT
ELECT CAUTERY BLADE 6.4 (BLADE) ×2 IMPLANT
ELECT REM PT RETURN 9FT ADLT (ELECTROSURGICAL) ×2
ELECTRODE REM PT RTRN 9FT ADLT (ELECTROSURGICAL) ×1 IMPLANT
GLOVE BIO SURGEON STRL SZ8 (GLOVE) ×2 IMPLANT
GLOVE BIOGEL PI IND STRL 8 (GLOVE) ×2 IMPLANT
GLOVE BIOGEL PI INDICATOR 8 (GLOVE) ×2
GLOVE ECLIPSE 7.5 STRL STRAW (GLOVE) ×2 IMPLANT
GOWN STRL NON-REIN LRG LVL3 (GOWN DISPOSABLE) ×4 IMPLANT
GOWN STRL REIN XL XLG (GOWN DISPOSABLE) ×2 IMPLANT
KIT BASIN OR (CUSTOM PROCEDURE TRAY) ×2 IMPLANT
KIT ROOM TURNOVER OR (KITS) ×2 IMPLANT
LIGASURE IMPACT 36 18CM CVD LR (INSTRUMENTS) IMPLANT
MESH VICRYL KNITTED 12X12 (Mesh General) ×2 IMPLANT
NS IRRIG 1000ML POUR BTL (IV SOLUTION) ×4 IMPLANT
PACK GENERAL/GYN (CUSTOM PROCEDURE TRAY) ×2 IMPLANT
PAD ARMBOARD 7.5X6 YLW CONV (MISCELLANEOUS) ×2 IMPLANT
SPECIMEN JAR LARGE (MISCELLANEOUS) IMPLANT
SPONGE GAUZE 4X4 12PLY (GAUZE/BANDAGES/DRESSINGS) ×2 IMPLANT
SPONGE LAP 18X18 X RAY DECT (DISPOSABLE) IMPLANT
STAPLER VISISTAT 35W (STAPLE) ×2 IMPLANT
SUCTION POOLE TIP (SUCTIONS) ×2 IMPLANT
SUT ETHIBOND 5 LR DA (SUTURE) ×8 IMPLANT
SUT PDS AB 1 TP1 96 (SUTURE) ×4 IMPLANT
SUT RET BRIDGE (SUTURE) ×4 IMPLANT
SUT SILK 2 0 SH CR/8 (SUTURE) ×2 IMPLANT
SUT SILK 2 0 TIES 10X30 (SUTURE) IMPLANT
SUT SILK 3 0 SH CR/8 (SUTURE) ×2 IMPLANT
SUT SILK 3 0 TIES 10X30 (SUTURE) IMPLANT
TAPE CLOTH SURG 4X10 WHT LF (GAUZE/BANDAGES/DRESSINGS) ×2 IMPLANT
TOWEL OR 17X26 10 PK STRL BLUE (TOWEL DISPOSABLE) ×2 IMPLANT
TRAY FOLEY CATH 14FRSI W/METER (CATHETERS) IMPLANT
WATER STERILE IRR 1000ML POUR (IV SOLUTION) IMPLANT
YANKAUER SUCT BULB TIP NO VENT (SUCTIONS) IMPLANT
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
## 2013-01-30 NOTE — Progress Notes (Signed)
Peripherally Inserted Central Catheter/Midline Placement  The IV Nurse has discussed with the patient and/or persons authorized to consent for the patient, the purpose of this procedure and the potential benefits and risks involved with this procedure.  The benefits include less needle sticks, lab draws from the catheter and patient may be discharged home with the catheter.  Risks include, but not limited to, infection, bleeding, blood clot (thrombus formation), and puncture of an artery; nerve damage and irregular heat beat.  Alternatives to this procedure were also discussed.  PICC/Midline Placement Documentation  PICC Triple Lumen 01/30/13 PICC Right Brachial (Active)       Christeen Douglas 01/30/2013, 8:26 PM

## 2013-01-30 NOTE — Preoperative (Signed)
Beta Blockers   Reason not to administer Beta Blockers:Not Applicable 
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
## 2013-01-30 NOTE — Transfer of Care (Signed)
Immediate Anesthesia Transfer of Care Note  Patient: Dakota Evans  Procedure(s) Performed: Procedure(s): EXPLORATORY LAPAROTOMY wash  closure of open abdominal wound (N/A)  Patient Location: SICU  Anesthesia Type:General  Level of Consciousness: Patient remains intubated per anesthesia plan  Airway & Oxygen Therapy: Patient remains intubated per anesthesia plan and Patient placed on Ventilator (see vital sign flow sheet for setting)  Post-op Assessment: Report given to PACU RN  Post vital signs: Reviewed and stable  Complications: No apparent anesthesia complications

## 2013-01-30 NOTE — Anesthesia Postprocedure Evaluation (Signed)
  Anesthesia Post-op Note  Patient: [employer]  Procedure(s) Performed: Procedure(s): EXPLORATORY LAPAROTOMY (N/A) ILEOSTOMY (N/A) ABDOMINAL VACUUM ASSISTED PARTIAL CLOSURE CHANGE (N/A)  Patient Location: ICU  Anesthesia Type:General  Level of Consciousness: sedated  Airway and Oxygen Therapy: Patient remains intubated per anesthesia plan and Patient placed on Ventilator (see vital sign flow sheet for setting)  Post-op Pain: none  Post-op Assessment: Post-op Vital signs reviewed, Patient's Cardiovascular Status Stable and Respiratory Function Stable  Post-op Vital Signs: Reviewed and stable  Complications: No apparent anesthesia complications

## 2013-01-30 NOTE — Anesthesia Postprocedure Evaluation (Signed)
  Anesthesia Post-op Note  Patient: [employer]  Procedure(s) Performed: Procedure(s): EXPLORATORY LAPAROTOMY wash  closure of open abdominal wound (N/A)  Patient Location: SICU  Anesthesia Type:General  Level of Consciousness: sedated and Patient remains intubated per anesthesia plan  Airway and Oxygen Therapy: Patient remains intubated per anesthesia plan  Post-op Pain: none  Post-op Assessment: Post-op Vital signs reviewed  Post-op Vital Signs: stable  Complications: No apparent anesthesia complications
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
## 2013-01-31 ENCOUNTER — Inpatient Hospital Stay (HOSPITAL_COMMUNITY): Payer: 59

## 2013-01-31 LAB — COMPREHENSIVE METABOLIC PANEL
Alkaline Phosphatase: 171 U/L — ABNORMAL HIGH (ref 39–117)
BUN: 27 mg/dL — ABNORMAL HIGH (ref 6–23)
CO2: 20 mEq/L (ref 19–32)
Chloride: 112 mEq/L (ref 96–112)
GFR calc Af Amer: 45 mL/min — ABNORMAL LOW (ref >90)
GFR calc non Af Amer: 39 mL/min — ABNORMAL LOW (ref >90)
Glucose, Bld: 117 mg/dL — ABNORMAL HIGH (ref 70–99)
Potassium: 4.2 mEq/L (ref 3.5–5.1)
Total Bilirubin: 0.8 mg/dL (ref 0.3–1.2)

## 2013-01-31 LAB — CBC
Hemoglobin: 7.7 g/dL — ABNORMAL LOW (ref 13.0–17.0)
MCH: 28.4 pg (ref 26.0–34.0)
MCHC: 33.6 g/dL (ref 30.0–36.0)
Platelets: 349 10*3/uL (ref 150–400)

## 2013-01-31 LAB — GLUCOSE, CAPILLARY
Glucose-Capillary: 104 mg/dL — ABNORMAL HIGH (ref 70–99)
Glucose-Capillary: 117 mg/dL — ABNORMAL HIGH (ref 70–99)

## 2013-01-31 LAB — HEMOGLOBIN AND HEMATOCRIT, BLOOD: HCT: 22.9 % — ABNORMAL LOW (ref 39.0–52.0)

## 2013-01-31 LAB — MAGNESIUM: Magnesium: 1.8 mg/dL (ref 1.5–2.5)

## 2013-01-31 MED ORDER — WHITE PETROLATUM GEL
1.0000 "application " | Freq: Two times a day (BID) | Status: DC
  Administered 2013-01-31 – 2013-02-07 (×15): 1 via TOPICAL
  Administered 2013-02-08: 0.2 via TOPICAL
  Administered 2013-02-08 – 2013-02-10 (×4): 1 via TOPICAL
  Administered 2013-02-10: 0.2 via TOPICAL
  Administered 2013-02-11 – 2013-02-12 (×3): 1 via TOPICAL
  Filled 2013-01-31 (×3): qty 5
  Filled 2013-01-31: qty 28.35
  Filled 2013-01-31: qty 5
  Filled 2013-01-31: qty 28.35
  Filled 2013-01-31 (×4): qty 5

## 2013-01-31 MED ORDER — QUETIAPINE FUMARATE 50 MG PO TABS
50.0000 mg | ORAL_TABLET | Freq: Two times a day (BID) | ORAL | Status: DC
  Administered 2013-01-31 (×2): 50 mg
  Filled 2013-01-31 (×4): qty 1

## 2013-01-31 MED ORDER — ADULT MULTIVITAMIN LIQUID CH
5.0000 mL | Freq: Every day | ORAL | Status: DC
  Administered 2013-01-31: 5 mL
  Filled 2013-01-31 (×3): qty 5

## 2013-01-31 MED ORDER — ALBUMIN HUMAN 25 % IV SOLN
12.5000 g | Freq: Two times a day (BID) | INTRAVENOUS | Status: AC
  Administered 2013-01-31 (×2): 12.5 g via INTRAVENOUS
  Filled 2013-01-31 (×2): qty 50

## 2013-01-31 MED ORDER — CLONAZEPAM 0.5 MG PO TABS
0.5000 mg | ORAL_TABLET | Freq: Two times a day (BID) | ORAL | Status: DC
  Administered 2013-01-31 (×2): 0.5 mg
  Filled 2013-01-31 (×3): qty 1

## 2013-01-31 MED ORDER — PIVOT 1.5 CAL PO LIQD
1000.0000 mL | ORAL | Status: DC
  Filled 2013-01-31 (×2): qty 1000

## 2013-01-31 MED ORDER — SODIUM CHLORIDE 0.9 % IV SOLN
500.0000 mg | Freq: Three times a day (TID) | INTRAVENOUS | Status: DC
  Administered 2013-01-31 – 2013-02-07 (×20): 500 mg via INTRAVENOUS
  Filled 2013-01-31 (×25): qty 500

## 2013-01-31 MED ORDER — PRO-STAT SUGAR FREE PO LIQD
60.0000 mL | Freq: Every day | ORAL | Status: DC
  Administered 2013-01-31 – 2013-02-01 (×5): 60 mL
  Filled 2013-01-31 (×10): qty 60

## 2013-01-31 MED ORDER — SODIUM CHLORIDE 0.9 % IV SOLN
1.0000 g | Freq: Once | INTRAVENOUS | Status: AC
  Administered 2013-01-31: 1 g via INTRAVENOUS
  Filled 2013-01-31: qty 10

## 2013-01-31 MED ORDER — TRACE MINERALS CR-CU-F-FE-I-MN-MO-SE-ZN IV SOLN
INTRAVENOUS | Status: AC
  Administered 2013-01-31: 19:00:00 via INTRAVENOUS
  Filled 2013-01-31: qty 2400

## 2013-01-31 MED ORDER — TRACE MINERALS CR-CU-F-FE-I-MN-MO-SE-ZN IV SOLN
INTRAVENOUS | Status: DC
  Filled 2013-01-31: qty 2000

## 2013-01-31 MED ORDER — CLONAZEPAM 0.1 MG/ML ORAL SUSPENSION
0.5000 mg | Freq: Two times a day (BID) | ORAL | Status: DC
  Filled 2013-01-31 (×2): qty 5
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
## 2013-02-01 ENCOUNTER — Encounter (HOSPITAL_COMMUNITY): Payer: Self-pay | Admitting: General Surgery

## 2013-02-01 LAB — BASIC METABOLIC PANEL
CO2: 19 mEq/L (ref 19–32)
Chloride: 114 mEq/L — ABNORMAL HIGH (ref 96–112)
Sodium: 141 mEq/L (ref 135–145)

## 2013-02-01 LAB — GLUCOSE, CAPILLARY
Glucose-Capillary: 114 mg/dL — ABNORMAL HIGH (ref 70–99)
Glucose-Capillary: 116 mg/dL — ABNORMAL HIGH (ref 70–99)
Glucose-Capillary: 117 mg/dL — ABNORMAL HIGH (ref 70–99)

## 2013-02-01 LAB — CBC
HCT: 22.5 % — ABNORMAL LOW (ref 39.0–52.0)
Hemoglobin: 7.4 g/dL — ABNORMAL LOW (ref 13.0–17.0)
MCHC: 32.9 g/dL (ref 30.0–36.0)
RDW: 15.6 % — ABNORMAL HIGH (ref 11.5–15.5)
WBC: 15.7 10*3/uL — ABNORMAL HIGH (ref 4.0–10.5)

## 2013-02-01 MED ORDER — METOPROLOL TARTRATE 1 MG/ML IV SOLN
5.0000 mg | INTRAVENOUS | Status: DC
  Administered 2013-02-01 – 2013-02-05 (×25): 5 mg via INTRAVENOUS
  Filled 2013-02-01 (×26): qty 5

## 2013-02-01 MED ORDER — FENTANYL CITRATE 0.05 MG/ML IJ SOLN
50.0000 ug | INTRAMUSCULAR | Status: DC | PRN
  Administered 2013-02-02 – 2013-02-04 (×20): 100 ug via INTRAVENOUS
  Filled 2013-02-01 (×20): qty 2

## 2013-02-01 MED ORDER — METOPROLOL TARTRATE 25 MG PO TABS
25.0000 mg | ORAL_TABLET | Freq: Two times a day (BID) | ORAL | Status: DC
  Filled 2013-02-01 (×2): qty 1

## 2013-02-01 MED ORDER — MIDAZOLAM HCL 2 MG/2ML IJ SOLN: 2.0000 mg | INTRAMUSCULAR | Status: DC | PRN

## 2013-02-01 MED ORDER — OXYCODONE HCL 5 MG PO TABS
5.0000 mg | ORAL_TABLET | ORAL | Status: DC | PRN
  Administered 2013-02-04 (×2): 10 mg via ORAL
  Filled 2013-02-01 (×2): qty 2

## 2013-02-01 MED ORDER — TRACE MINERALS CR-CU-F-FE-I-MN-MO-SE-ZN IV SOLN
INTRAVENOUS | Status: AC
  Administered 2013-02-01: 18:00:00 via INTRAVENOUS
  Filled 2013-02-01: qty 2400
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
## 2013-02-01 NOTE — Progress Notes (Signed)
Noted extubation. Please reorder therapy once appropriate. We continue to follow pts progress to assist with rehab venue efforts. [phone] Genie will follow up on Monday [phone]

## 2013-02-01 NOTE — Procedures (Signed)
Extubation Procedure Note  Patient Details:   Name: Bobbyjoe Pabst DOB: [birth year] MRN: [id number]   Patient extubated to 4L .  RR 36.  Patient able to speak after extubation.  States he is feeling cold.  RT will continue to monitor.     Evaluation  O2 sats: stable throughout Complications: No apparent complications Patient did tolerate procedure well. Bilateral Breath Sounds: Clear Suctioning: Airway Yes  Durwin Glaze 02/01/2013, 9:30 AM
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
## 2013-02-01 NOTE — Progress Notes (Signed)
Patient ID: Dakota Evans, male   DOB: 1986/04/16, 27 y.o.   MRN: [id number] Extubated and able to speak.  I called his mother to give her an update on his condition and our plan of care. Violeta Gelinas, MD, MPH, FACS Pager: [phone]

## 2013-02-01 NOTE — Progress Notes (Signed)
Patient's IV sedation and analgesic discontinued due to patient extubation.  of IV fentanyl and 60 ml IV propofol wasted in sink and verified by second RN, Ulis Rias.    Keitha Butte, RN
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
## 2013-02-02 LAB — CBC
MCH: 27.8 pg (ref 26.0–34.0)
MCV: 83.2 fL (ref 78.0–100.0)
Platelets: 321 10*3/uL (ref 150–400)
RDW: 14.7 % (ref 11.5–15.5)

## 2013-02-02 LAB — BASIC METABOLIC PANEL
CO2: 18 mEq/L — ABNORMAL LOW (ref 19–32)
Calcium: 8 mg/dL — ABNORMAL LOW (ref 8.4–10.5)
Creatinine, Ser: 2.31 mg/dL — ABNORMAL HIGH (ref 0.50–1.35)
GFR calc Af Amer: 44 mL/min — ABNORMAL LOW (ref >90)

## 2013-02-02 LAB — GLUCOSE, CAPILLARY

## 2013-02-02 MED ORDER — TRACE MINERALS CR-CU-F-FE-I-MN-MO-SE-ZN IV SOLN
INTRAVENOUS | Status: AC
  Administered 2013-02-02: 18:00:00 via INTRAVENOUS
  Filled 2013-02-02: qty 2000

## 2013-02-02 MED ORDER — LORAZEPAM 2 MG/ML IJ SOLN
1.0000 mg | Freq: Three times a day (TID) | INTRAMUSCULAR | Status: DC | PRN
  Administered 2013-02-02 – 2013-02-04 (×6): 1 mg via INTRAVENOUS
  Filled 2013-02-02 (×5): qty 1

## 2013-02-02 MED ORDER — LORAZEPAM BOLUS VIA INFUSION: 1.0000 mg | Freq: Three times a day (TID) | INTRAVENOUS | Status: DC | PRN

## 2013-02-02 MED ORDER — POTASSIUM CHLORIDE IN NACL 20-0.9 MEQ/L-% IV SOLN
INTRAVENOUS | Status: DC
  Administered 2013-02-02 – 2013-02-11 (×14): via INTRAVENOUS
  Filled 2013-02-02 (×15): qty 1000

## 2013-02-02 NOTE — Progress Notes (Signed)
3 Days Post-Op  Subjective: Awake Denies SOB.  Got easily full with liquids  Objective: Vital signs in last 24 hours: Temp:  [97.5 F (36.4 C)-100.4 F (38 C)] 99.2 F (37.3 C) (06/28 0757) Pulse Rate:  [97-121] 99 (06/28 0700) Resp:  [28-48] 37 (06/28 0700) BP: (100-158)/(47-95) 151/84 mmHg (06/28 0700) SpO2:  [98 %-100 %] 100 % (06/28 0700) FiO2 (%):  [29.7 %] 29.7 % (06/27 0900) Last BM Date: 02/01/13  Intake/Output from previous day: 06/27 0701 - 06/28 0700 In: 5357.7 [P.O.:540; I.V.:1987.7; NG/GT:30; IV Piggyback:400; TPN:2400] Out: 6975 [Urine:4175; Emesis/NG output:450; Stool:2350] Intake/Output this shift:    Slightly increased resp rate Abdomen soft, dressings intact. Ostomy viable with liquid in bag.   Lab Results:   Recent Labs  02/01/13 0500 02/02/13 0356  WBC 15.7* 19.9*  HGB 7.4* 7.6*  HCT 22.5* 22.7*  PLT 305 321   BMET  Recent Labs  02/01/13 0500 02/02/13 0356  NA 141 141  K 3.9 3.5  CL 114* 114*  CO2 19 18*  GLUCOSE 108* 120*  BUN 31* 34*  CREATININE 2.45* 2.31*  CALCIUM 8.1* 8.0*   PT/INR No results found for this basename: LABPROT, INR,  in the last 72 hours ABG No results found for this basename: PHART, PCO2, PO2, HCO3,  in the last 72 hours  Studies/Results: No results found.  Anti-infectives: Anti-infectives   Start     Dose/Rate Route Frequency Ordered Stop   01/31/13 1400  imipenem-cilastatin (PRIMAXIN) 500 mg in sodium chloride 0.9 % 100 mL IVPB     500 mg 200 mL/hr over 30 Minutes Intravenous 3 times per day 01/31/13 0849     01/29/13 2000  vancomycin (VANCOCIN) IVPB 1000 mg/200 mL premix  Status:  Discontinued     1,000 mg 200 mL/hr over 60 Minutes Intravenous Every 12 hours 01/29/13 0951 01/31/13 0719   01/29/13 1200  imipenem-cilastatin (PRIMAXIN) 500 mg in sodium chloride 0.9 % 100 mL IVPB  Status:  Discontinued     500 mg 200 mL/hr over 30 Minutes Intravenous 4 times per day 01/29/13 0816 01/31/13 0849   01/29/13 0800  vancomycin (VANCOCIN) IVPB 750 mg/150 ml premix  Status:  Discontinued     750 mg 150 mL/hr over 60 Minutes Intravenous Every 12 hours 01/28/13 1110 01/29/13 0951   01/26/13 0845  imipenem-cilastatin (PRIMAXIN) 500 mg in sodium chloride 0.9 % 100 mL IVPB  Status:  Discontinued     500 mg 200 mL/hr over 30 Minutes Intravenous 3 times per day 01/26/13 0830 01/29/13 0816   01/26/13 0845  vancomycin (VANCOCIN) 1,500 mg in sodium chloride 0.9 % 500 mL IVPB  Status:  Discontinued     1,500 mg 250 mL/hr over 120 Minutes Intravenous Every 24 hours 01/26/13 0830 01/28/13 1110   01/25/13 1500  micafungin (MYCAMINE) 100 mg in sodium chloride 0.9 % 100 mL IVPB     100 mg 100 mL/hr over 1 Hours Intravenous Daily 01/25/13 1317     01/25/13 0746  vancomycin (VANCOCIN) 1 GM/200ML IVPB    Comments:  HYPES, KAREN: cabinet override      01/25/13 0746 01/25/13 0800   01/23/13 2330  vancomycin (VANCOCIN) IVPB 1000 mg/200 mL premix  Status:  Discontinued     1,000 mg 200 mL/hr over 60 Minutes Intravenous Every 8 hours 01/23/13 1716 01/25/13 1317   01/20/13 1800  piperacillin-tazobactam (ZOSYN) IVPB 3.375 g  Status:  Discontinued     3.375 g 12.5 mL/hr over 240 Minutes Intravenous  Every 8 hours 01/20/13 1109 01/26/13 0805   01/20/13 1600  vancomycin (VANCOCIN) IVPB 1000 mg/200 mL premix  Status:  Discontinued     1,000 mg 200 mL/hr over 60 Minutes Intravenous Every 12 hours 01/20/13 0219 01/23/13 1716   01/20/13 1200  piperacillin-tazobactam (ZOSYN) IVPB 3.375 g     3.375 g 100 mL/hr over 30 Minutes Intravenous  Once 01/20/13 1109 01/20/13 1229   01/20/13 0300  ceFEPIme (MAXIPIME) 2 g in dextrose 5 % 50 mL IVPB  Status:  Discontinued     2 g 100 mL/hr over 30 Minutes Intravenous 3 times per day 01/20/13 0205 01/20/13 1029   01/20/13 0300  clindamycin (CLEOCIN) IVPB 900 mg  Status:  Discontinued     900 mg 100 mL/hr over 30 Minutes Intravenous 3 times per day 01/20/13 0205 01/20/13 1029    01/20/13 0300  vancomycin (VANCOCIN) 2,000 mg in sodium chloride 0.9 % 500 mL IVPB     2,000 mg 250 mL/hr over 120 Minutes Intravenous  Once 01/20/13 0219 01/20/13 0509   01/20/13 0215  vancomycin (VANCOCIN) IVPB 1000 mg/200 mL premix  Status:  Discontinued     1,000 mg 200 mL/hr over 60 Minutes Intravenous Every 12 hours 01/20/13 0205 01/20/13 0211      Assessment/Plan: s/p Procedure(s): EXPLORATORY LAPAROTOMY wash  closure of open abdominal wound (N/A)  Continue clears but go slow.  Had emesis yesterday. Pulmonary toilet Cr. Improving. Hgb stable.  WBC up.   LOS: 16 days    Grayson White A 02/02/2013
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
## 2013-02-03 ENCOUNTER — Inpatient Hospital Stay (HOSPITAL_COMMUNITY): Payer: 59

## 2013-02-03 LAB — BASIC METABOLIC PANEL
BUN: 36 mg/dL — ABNORMAL HIGH (ref 6–23)
Calcium: 7.9 mg/dL — ABNORMAL LOW (ref 8.4–10.5)
Creatinine, Ser: 2.27 mg/dL — ABNORMAL HIGH (ref 0.50–1.35)
GFR calc Af Amer: 44 mL/min — ABNORMAL LOW (ref >90)
GFR calc non Af Amer: 38 mL/min — ABNORMAL LOW (ref >90)
Glucose, Bld: 129 mg/dL — ABNORMAL HIGH (ref 70–99)
Potassium: 3.1 meq/L — ABNORMAL LOW (ref 3.5–5.1)

## 2013-02-03 LAB — GLUCOSE, CAPILLARY
Glucose-Capillary: 129 mg/dL — ABNORMAL HIGH (ref 70–99)
Glucose-Capillary: 116 mg/dL — ABNORMAL HIGH (ref 70–99)

## 2013-02-03 LAB — CBC
HCT: 23.1 % — ABNORMAL LOW (ref 39.0–52.0)
Hemoglobin: 7.8 g/dL — ABNORMAL LOW (ref 13.0–17.0)
MCH: 27.7 pg (ref 26.0–34.0)
MCHC: 33.8 g/dL (ref 30.0–36.0)
MCV: 81.9 fL (ref 78.0–100.0)
RDW: 14.2 % (ref 11.5–15.5)

## 2013-02-03 LAB — TRIGLYCERIDES: Triglycerides: 256 mg/dL — ABNORMAL HIGH (ref <150)

## 2013-02-03 MED ORDER — TRACE MINERALS CR-CU-F-FE-I-MN-MO-SE-ZN IV SOLN
INTRAVENOUS | Status: DC
  Filled 2013-02-03: qty 2000

## 2013-02-03 MED ORDER — TRACE MINERALS CR-CU-F-FE-I-MN-MO-SE-ZN IV SOLN
INTRAVENOUS | Status: AC
  Administered 2013-02-03: 17:00:00 via INTRAVENOUS
  Filled 2013-02-03: qty 3000

## 2013-02-03 MED ORDER — POTASSIUM CHLORIDE 20 MEQ/15ML (10%) PO LIQD
ORAL | Status: AC
  Filled 2013-02-03: qty 30

## 2013-02-03 MED ORDER — DEXTROSE 10 % IV SOLN
INTRAVENOUS | Status: DC
  Administered 2013-02-03: 12:00:00 via INTRAVENOUS

## 2013-02-03 MED ORDER — METOCLOPRAMIDE HCL 5 MG/ML IJ SOLN
10.0000 mg | Freq: Four times a day (QID) | INTRAMUSCULAR | Status: DC
  Administered 2013-02-03 – 2013-02-04 (×4): 10 mg via INTRAVENOUS
  Filled 2013-02-03 (×8): qty 2

## 2013-02-03 MED ORDER — POTASSIUM CHLORIDE 20 MEQ/15ML (10%) PO LIQD
30.0000 meq | ORAL | Status: AC
  Administered 2013-02-03 (×2): 30 meq via ORAL
  Filled 2013-02-03 (×2): qty 22.5
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
## 2013-02-04 LAB — COMPREHENSIVE METABOLIC PANEL
AST: 74 U/L — ABNORMAL HIGH (ref 0–37)
Albumin: 1.8 g/dL — ABNORMAL LOW (ref 3.5–5.2)
Alkaline Phosphatase: 283 U/L — ABNORMAL HIGH (ref 39–117)
BUN: 31 mg/dL — ABNORMAL HIGH (ref 6–23)
Chloride: 106 mEq/L (ref 96–112)
Potassium: 3.4 mEq/L — ABNORMAL LOW (ref 3.5–5.1)
Sodium: 137 mEq/L (ref 135–145)
Total Bilirubin: 1.2 mg/dL (ref 0.3–1.2)
Total Protein: 6.6 g/dL (ref 6.0–8.3)

## 2013-02-04 LAB — CBC
MCH: 28.5 pg (ref 26.0–34.0)
MCV: 81 fL (ref 78.0–100.0)
Platelets: 365 10*3/uL (ref 150–400)
RBC: 2.95 MIL/uL — ABNORMAL LOW (ref 4.22–5.81)
RDW: 13.9 % (ref 11.5–15.5)

## 2013-02-04 LAB — GLUCOSE, CAPILLARY
Glucose-Capillary: 118 mg/dL — ABNORMAL HIGH (ref 70–99)
Glucose-Capillary: 123 mg/dL — ABNORMAL HIGH (ref 70–99)
Glucose-Capillary: 130 mg/dL — ABNORMAL HIGH (ref 70–99)

## 2013-02-04 LAB — PREALBUMIN: Prealbumin: 24.5 mg/dL (ref 17.0–34.0)

## 2013-02-04 LAB — DIFFERENTIAL
Basophils Absolute: 0 10*3/uL (ref 0.0–0.1)
Eosinophils Absolute: 0.4 10*3/uL (ref 0.0–0.7)
Lymphs Abs: 2 10*3/uL (ref 0.7–4.0)
Monocytes Absolute: 2.4 10*3/uL — ABNORMAL HIGH (ref 0.1–1.0)
Neutrophils Relative %: 74 % (ref 43–77)

## 2013-02-04 LAB — MAGNESIUM: Magnesium: 2.2 mg/dL (ref 1.5–2.5)

## 2013-02-04 MED ORDER — POTASSIUM CHLORIDE 20 MEQ/15ML (10%) PO LIQD
ORAL | Status: AC
  Filled 2013-02-04: qty 15

## 2013-02-04 MED ORDER — POTASSIUM CHLORIDE 20 MEQ/15ML (10%) PO LIQD
40.0000 meq | Freq: Once | ORAL | Status: AC
  Administered 2013-02-04: 40 meq via ORAL
  Filled 2013-02-04: qty 30

## 2013-02-04 MED ORDER — TRACE MINERALS CR-CU-F-FE-I-MN-MO-SE-ZN IV SOLN
INTRAVENOUS | Status: DC
  Administered 2013-02-04: 17:00:00 via INTRAVENOUS
  Filled 2013-02-04: qty 3000

## 2013-02-04 MED ORDER — FAT EMULSION 20 % IV EMUL
240.0000 mL | INTRAVENOUS | Status: DC
  Administered 2013-02-04: 240 mL via INTRAVENOUS
  Filled 2013-02-04: qty 250

## 2013-02-04 MED ORDER — ZOLPIDEM TARTRATE 5 MG PO TABS
5.0000 mg | ORAL_TABLET | Freq: Every day | ORAL | Status: DC
  Administered 2013-02-04 – 2013-02-11 (×8): 5 mg via ORAL
  Filled 2013-02-04 (×8): qty 1

## 2013-02-04 MED ORDER — ACETAMINOPHEN 325 MG PO TABS
650.0000 mg | ORAL_TABLET | Freq: Four times a day (QID) | ORAL | Status: DC | PRN
  Administered 2013-02-04 – 2013-02-08 (×6): 650 mg via ORAL
  Filled 2013-02-04 (×7): qty 2

## 2013-02-04 MED ORDER — HYDROMORPHONE HCL PF 1 MG/ML IJ SOLN
1.0000 mg | INTRAMUSCULAR | Status: DC | PRN
  Administered 2013-02-04 (×3): 1 mg via INTRAVENOUS
  Administered 2013-02-04 – 2013-02-05 (×2): 1.5 mg via INTRAVENOUS
  Filled 2013-02-04 (×2): qty 1
  Filled 2013-02-04 (×2): qty 2
  Filled 2013-02-04: qty 1

## 2013-02-04 MED ORDER — LOPERAMIDE HCL 1 MG/5ML PO LIQD
2.0000 mg | Freq: Two times a day (BID) | ORAL | Status: DC
  Administered 2013-02-04 – 2013-02-09 (×10): 2 mg via ORAL
  Filled 2013-02-04 (×13): qty 10

## 2013-02-04 MED ORDER — ONDANSETRON HCL 4 MG/2ML IJ SOLN
4.0000 mg | Freq: Four times a day (QID) | INTRAMUSCULAR | Status: DC
  Administered 2013-02-04 – 2013-02-10 (×20): 4 mg via INTRAVENOUS
  Filled 2013-02-04 (×19): qty 2
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
## 2013-02-04 NOTE — Progress Notes (Signed)
Trauma Service Note  Subjective: Patient is talkative, but still a bit tachypneic .  Objective: Vital signs in last 24 hours: Temp:  [98.4 F (36.9 C)-100.6 F (38.1 C)] 98.4 F (36.9 C) (06/30 0738) Pulse Rate:  [100-121] 106 (06/30 0700) Resp:  [29-46] 37 (06/30 0700) BP: (143-172)/(80-131) 143/81 mmHg (06/30 0700) SpO2:  [95 %-100 %] 98 % (06/30 0700) Weight:  [104.1 kg (229 lb 8 oz)] 104.1 kg (229 lb 8 oz) (06/30 0500) Last BM Date:  (ileostomy)  Intake/Output from previous day: 06/29 0701 - 06/30 0700 In: 5490.9 [P.O.:608; I.V.:2157.9; IV Piggyback:400; TPN:2325] Out: 8550 [Urine:6575; Stool:1975] Intake/Output this shift:    General: No acute distress, but vomited yesterday AM, not large amount.  Ileostomy working very well.  Getting fluids at 185cc/hr  Lungs: Clear to auscultation.  Sats are 98% on RA.  Abd: Wound looks great.  Ileostomy is functioning well.  Non-tender.  Good bowel sounds.  Extremities: No DVT signs or symptoms.  No calf tenderness, No swelling  Neuro: Intact.  Lab Results: CBC   Recent Labs  02/03/13 0418 02/04/13 0345  WBC 17.9* 18.5*  HGB 7.8* 8.4*  HCT 23.1* 23.9*  PLT 342 365   BMET  Recent Labs  02/03/13 0418 02/04/13 0345  NA 137 137  K 3.1* 3.4*  CL 110 106  CO2 17* 18*  GLUCOSE 129* 122*  BUN 36* 31*  CREATININE 2.27* 2.23*  CALCIUM 7.9* 8.0*   PT/INR No results found for this basename: LABPROT, INR,  in the last 72 hours ABG No results found for this basename: PHART, PCO2, PO2, HCO3,  in the last 72 hours  Studies/Results: Dg Chest Port 1 View  02/03/2013   *RADIOLOGY REPORT*  Clinical Data: Short of breath  PORTABLE CHEST - 1 VIEW  Comparison: 01/31/2013  Findings: Endotracheal and NG tubes removed.  Right PICC stable. Right internal jugular central venous catheter removed.  Bilateral pleural effusions and bibasilar volume loss are worse.  No pneumothorax.  IMPRESSION: Increasing bilateral pleural effusions left  greater than right and bibasilar atelectasis.  The patient is extubated.   Original Report Authenticated By: Jolaine Click, M.D.    Anti-infectives: Anti-infectives   Start     Dose/Rate Route Frequency Ordered Stop   01/31/13 1400  imipenem-cilastatin (PRIMAXIN) 500 mg in sodium chloride 0.9 % 100 mL IVPB     500 mg 200 mL/hr over 30 Minutes Intravenous 3 times per day 01/31/13 0849     01/29/13 2000  vancomycin (VANCOCIN) IVPB 1000 mg/200 mL premix  Status:  Discontinued     1,000 mg 200 mL/hr over 60 Minutes Intravenous Every 12 hours 01/29/13 0951 01/31/13 0719   01/29/13 1200  imipenem-cilastatin (PRIMAXIN) 500 mg in sodium chloride 0.9 % 100 mL IVPB  Status:  Discontinued     500 mg 200 mL/hr over 30 Minutes Intravenous 4 times per day 01/29/13 0816 01/31/13 0849   01/29/13 0800  vancomycin (VANCOCIN) IVPB 750 mg/150 ml premix  Status:  Discontinued     750 mg 150 mL/hr over 60 Minutes Intravenous Every 12 hours 01/28/13 1110 01/29/13 0951   01/26/13 0845  imipenem-cilastatin (PRIMAXIN) 500 mg in sodium chloride 0.9 % 100 mL IVPB  Status:  Discontinued     500 mg 200 mL/hr over 30 Minutes Intravenous 3 times per day 01/26/13 0830 01/29/13 0816   01/26/13 0845  vancomycin (VANCOCIN) 1,500 mg in sodium chloride 0.9 % 500 mL IVPB  Status:  Discontinued  1,500 mg 250 mL/hr over 120 Minutes Intravenous Every 24 hours 01/26/13 0830 01/28/13 1110   01/25/13 1500  micafungin (MYCAMINE) 100 mg in sodium chloride 0.9 % 100 mL IVPB     100 mg 100 mL/hr over 1 Hours Intravenous Daily 01/25/13 1317     01/25/13 0746  vancomycin (VANCOCIN) 1 GM/200ML IVPB    Comments:  HYPES, KAREN: cabinet override      01/25/13 0746 01/25/13 0800   01/23/13 2330  vancomycin (VANCOCIN) IVPB 1000 mg/200 mL premix  Status:  Discontinued     1,000 mg 200 mL/hr over 60 Minutes Intravenous Every 8 hours 01/23/13 1716 01/25/13 1317   01/20/13 1800  piperacillin-tazobactam (ZOSYN) IVPB 3.375 g  Status:   Discontinued     3.375 g 12.5 mL/hr over 240 Minutes Intravenous Every 8 hours 01/20/13 1109 01/26/13 0805   01/20/13 1600  vancomycin (VANCOCIN) IVPB 1000 mg/200 mL premix  Status:  Discontinued     1,000 mg 200 mL/hr over 60 Minutes Intravenous Every 12 hours 01/20/13 0219 01/23/13 1716   01/20/13 1200  piperacillin-tazobactam (ZOSYN) IVPB 3.375 g     3.375 g 100 mL/hr over 30 Minutes Intravenous  Once 01/20/13 1109 01/20/13 1229   01/20/13 0300  ceFEPIme (MAXIPIME) 2 g in dextrose 5 % 50 mL IVPB  Status:  Discontinued     2 g 100 mL/hr over 30 Minutes Intravenous 3 times per day 01/20/13 0205 01/20/13 1029   01/20/13 0300  clindamycin (CLEOCIN) IVPB 900 mg  Status:  Discontinued     900 mg 100 mL/hr over 30 Minutes Intravenous 3 times per day 01/20/13 0205 01/20/13 1029   01/20/13 0300  vancomycin (VANCOCIN) 2,000 mg in sodium chloride 0.9 % 500 mL IVPB     2,000 mg 250 mL/hr over 120 Minutes Intravenous  Once 01/20/13 0219 01/20/13 0509   01/20/13 0215  vancomycin (VANCOCIN) IVPB 1000 mg/200 mL premix  Status:  Discontinued     1,000 mg 200 mL/hr over 60 Minutes Intravenous Every 12 hours 01/20/13 0205 01/20/13 0211      Assessment/Plan: s/p Procedure(s): EXPLORATORY LAPAROTOMY wash  closure of open abdominal wound d/c foley Advance diet Ileostomy output is high, but IVFs are total at 185cc/hr. Even with that, he is about 3-4 liters negative on fluids.  Will try to slow down ileostomy output with Imodium, and will try to bolster intravascular volume with albumin. Can be transferred from ICU to SDU.  3300 preferably.  LOS: 18 days   Marta Lamas. Gae Bon, MD, FACS [phone] Trauma Surgeon 02/04/2013

## 2013-02-04 NOTE — Progress Notes (Signed)
Report called to Noralyn Pick, RN and met RN at bedside.  Patient transferred from 2310 to 3312 and attached to tele monitor without complication.
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
## 2013-02-04 NOTE — Progress Notes (Signed)
Rehab admissions - I am continuing to follow for possible admission to acute inpatient rehab once patient is medically stable.  Noted transfer to 3300 and PT/OT therapy updates.  I will follow up in am again.  Call me for questions.  #[phone]

## 2013-02-04 NOTE — Progress Notes (Signed)
Pt's CM from [employer] called again today to give me her contact info (already documented by covering CM, Ronny Flurry, on 02/01/2013).  She also offered to assist with d/c planning.  I advised her that pt may be potentially ready for d/c later this week and would likely be for inpatient rehab.  She gave me the contact person's name and number for approval for CIR.  See below.   Hillard Danker (P) [phone], press Option 1, then extension [phone]. (F) [phone].

## 2013-02-04 NOTE — Progress Notes (Signed)
UR of chart completed.  Therapies recommending inpatient rehab at d/c. Will require insurance approval Counselling psychologist).
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
## 2013-02-05 ENCOUNTER — Inpatient Hospital Stay (HOSPITAL_COMMUNITY): Payer: 59

## 2013-02-05 DIAGNOSIS — R000 Tachycardia, unspecified: Secondary | ICD-10-CM

## 2013-02-05 LAB — CBC WITH DIFFERENTIAL/PLATELET
Basophils Absolute: 0.1 10*3/uL (ref 0.0–0.1)
Basophils Relative: 1 % (ref 0–1)
Eosinophils Relative: 2 % (ref 0–5)
HCT: 23.8 % — ABNORMAL LOW (ref 39.0–52.0)
MCHC: 34 g/dL (ref 30.0–36.0)
Monocytes Absolute: 2 10*3/uL — ABNORMAL HIGH (ref 0.1–1.0)
Neutro Abs: 13.4 10*3/uL — ABNORMAL HIGH (ref 1.7–7.7)
Platelets: 397 10*3/uL (ref 150–400)
RDW: 14.2 % (ref 11.5–15.5)

## 2013-02-05 LAB — BODY FLUID CELL COUNT WITH DIFFERENTIAL
Lymphs, Fluid: 27 %
Neutrophil Count, Fluid: 59 % — ABNORMAL HIGH (ref 0–25)
Total Nucleated Cell Count, Fluid: 1827 cu mm — ABNORMAL HIGH (ref 0–1000)

## 2013-02-05 LAB — BASIC METABOLIC PANEL
BUN: 30 mg/dL — ABNORMAL HIGH (ref 6–23)
Chloride: 103 meq/L (ref 96–112)
GFR calc Af Amer: 48 mL/min — ABNORMAL LOW (ref >90)
GFR calc non Af Amer: 41 mL/min — ABNORMAL LOW (ref >90)
Potassium: 3.7 meq/L (ref 3.5–5.1)
Sodium: 133 meq/L — ABNORMAL LOW (ref 135–145)

## 2013-02-05 LAB — AMYLASE, BODY FLUID: Amylase, Fluid: 164 U/L

## 2013-02-05 LAB — TRIGLYCERIDES: Triglycerides: 275 mg/dL — ABNORMAL HIGH (ref <150)

## 2013-02-05 LAB — GLUCOSE, SEROUS FLUID: Glucose, Fluid: 121 mg/dL

## 2013-02-05 MED ORDER — ATENOLOL 25 MG PO TABS
25.0000 mg | ORAL_TABLET | Freq: Two times a day (BID) | ORAL | Status: DC
  Administered 2013-02-05 – 2013-02-12 (×15): 25 mg via ORAL
  Filled 2013-02-05 (×16): qty 1

## 2013-02-05 MED ORDER — OXYCODONE HCL 5 MG PO TABS
5.0000 mg | ORAL_TABLET | ORAL | Status: DC | PRN
  Administered 2013-02-05: 10 mg via ORAL
  Administered 2013-02-05 (×2): 5 mg via ORAL
  Administered 2013-02-07: 10 mg via ORAL
  Administered 2013-02-07 – 2013-02-08 (×4): 15 mg via ORAL
  Filled 2013-02-05 (×3): qty 3
  Filled 2013-02-05 (×2): qty 2
  Filled 2013-02-05: qty 1
  Filled 2013-02-05: qty 3
  Filled 2013-02-05: qty 1

## 2013-02-05 MED ORDER — HYDROMORPHONE HCL PF 1 MG/ML IJ SOLN
0.5000 mg | INTRAMUSCULAR | Status: DC | PRN
  Administered 2013-02-05 – 2013-02-10 (×12): 0.5 mg via INTRAVENOUS
  Filled 2013-02-05 (×13): qty 1

## 2013-02-05 MED ORDER — IOHEXOL 300 MG/ML  SOLN
25.0000 mL | INTRAMUSCULAR | Status: AC
  Administered 2013-02-05 (×2): 25 mL via ORAL

## 2013-02-05 MED ORDER — ONDANSETRON HCL 4 MG/2ML IJ SOLN
4.0000 mg | INTRAMUSCULAR | Status: DC | PRN
  Administered 2013-02-05 – 2013-02-08 (×2): 4 mg via INTRAVENOUS
  Filled 2013-02-05 (×6): qty 2

## 2013-02-05 MED ORDER — ADULT MULTIVITAMIN W/MINERALS CH
1.0000 | ORAL_TABLET | Freq: Every day | ORAL | Status: DC
  Administered 2013-02-05 – 2013-02-12 (×8): 1 via ORAL
  Filled 2013-02-05 (×8): qty 1

## 2013-02-05 MED ORDER — ENOXAPARIN SODIUM 30 MG/0.3ML ~~LOC~~ SOLN
30.0000 mg | Freq: Two times a day (BID) | SUBCUTANEOUS | Status: DC
  Administered 2013-02-05 – 2013-02-12 (×15): 30 mg via SUBCUTANEOUS
  Filled 2013-02-05 (×18): qty 0.3

## 2013-02-05 MED ORDER — ONDANSETRON HCL 4 MG PO TABS: 4.0000 mg | ORAL_TABLET | ORAL | Status: DC | PRN
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
## 2013-02-05 NOTE — Procedures (Signed)
US guided diagnostic/therapeutic left thoracentesis performed yielding 1.1 liters slightly turbid, yellow fluid. A portion of the fluid was sent to the lab for preordered studies. F/u CXR pending. No immediate complications.
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
## 2013-02-05 NOTE — Progress Notes (Signed)
Pt returned from ultrasound,vss, band aid to left back.

## 2013-02-05 NOTE — Consult Note (Addendum)
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 Stomal assessment/size: Stoma red and viable, 1 1/4 inches, above skin level. Peristomal assessment: Intact skin surrounding Output Mod green liquid (75cc) Ostomy pouching: 2pc.  Education provided: Demonstrated pouch change.  Pt watched procedure and asked appropriate questions.  Able to open and close velcro to empty.  Supplies ordered to bedside for staff use.  WOC team will continue educational sessions when pt is stable and out of ICU setting.    CCS following for assessment and plan of care to abd wound. Previously had wound vac according to progress notes.  Full thickness post-op wound 100% beefy red with retention sutures in place.  78G9.5X2.5cm.  Surgical team has already ordered moist gauze packing Q day for bedside nursing to perform.  Pt has Montgomery straps to hold in place.  Tolerated wound assessment and dressing change with mod discomfort. Continue present wound care orders by CCS team. Cammie Mcgee MSN, RN, CWOCN, CWCN-AP, CNS [phone]

## 2013-02-05 NOTE — Progress Notes (Signed)
Physical Therapy Treatment Patient Details Name: Dakota Evans MRN: [id number] DOB: Oct 03, 1985 Today's Date: 02/05/2013 Time: 6962-9528 PT Time Calculation (min): 28 min  PT Assessment / Plan / Recommendation  PT Comments   Patient progressing with decreased assist needed despite having limited treatment with dressings and drainage from abdominal wound.  Continue to feel pt needs CIR due to multiple deficits which remain in cognition, balance, activity tolerance and with new colostomy and open wound.  PT to follow acutely.  Follow Up Recommendations  CIR     Does the patient have the potential to tolerate intense rehabilitation   Yes  Barriers to Discharge  None      Equipment Recommendations  Rolling walker with 5" wheels    Recommendations for Other Services  Rehab consult  Frequency Min 4X/week   Progress towards PT Goals Progress towards PT goals: Progressing toward goals  Plan Current plan remains appropriate    Precautions / Restrictions Precautions Precautions: Fall Precaution Comments: colostomy and wound on abdomen   Pertinent Vitals/Pain Min c.o abdominal pain with mobility    Mobility  Bed Mobility Rolling Right: 4: Min guard;With rail Right Sidelying to Sit: 4: Min guard;With rails Sit to Supine: 3: Mod assist Details for Bed Mobility Assistance: cues for technique without elevating head of bed and assist for lifting legs into bed Transfers Sit to Stand: 4: Min assist;From bed;With upper extremity assist Stand to Sit: 4: Min assist;To bed;With upper extremity assist Details for Transfer Assistance: assist for safety and to manage lines, cues for hand placement for safety Ambulation/Gait Ambulation/Gait Assistance: 4: Min assist Ambulation Distance (Feet): 100 Feet Assistive device: Rolling walker Ambulation/Gait Assistance Details: assist from right due to visual deficits, limited due to dressings and drainage falling from abdominal wound Gait Pattern:  Step-through pattern;Decreased stride length      PT Goals (current goals can now be found in the care plan section)    Visit Information  Last PT Received On: 02/05/13 Assistance Needed: +1    Subjective Data   I want to take a nap after coming back from all that (CT scan.)   Cognition  Cognition Arousal/Alertness: Awake/alert Behavior During Therapy: WFL for tasks assessed/performed Overall Cognitive Status: Impaired/Different from baseline Current Attention Level: Sustained Memory: Decreased recall of precautions;Decreased short-term memory Following Commands: Follows one step commands consistently Safety/Judgement: Decreased awareness of safety Problem Solving: Slow processing;Requires verbal cues Rancho Levels of Cognitive Functioning Rancho Los Amigos Scales of Cognitive Functioning: Confused/appropriate    Pension scheme manager Standing - Balance Support: Bilateral upper extremity supported Static Standing - Level of Assistance: 4: Min assist  End of Session PT - End of Session Equipment Utilized During Treatment: Gait belt Activity Tolerance: Other (comment) (limited due to dressings falling off and wound drainage) Patient left: in bed;with call bell/phone within reach;with nursing/sitter in room;with family/visitor present   GP     [facility] 02/05/2013, 3:30 PM Sheran Lawless, PT [phone] 02/05/2013

## 2013-02-05 NOTE — Progress Notes (Signed)
Rehab admissions - I have faxed updated PT/OT and ST notes to case manager requesting acute inpatient rehab admission.  I will follow up tomorrow for medical readiness.  Call me for questions.  #[phone]

## 2013-02-05 NOTE — Progress Notes (Signed)
In light of ongoing fevers will check CT A/P with oral contrast only to R/O abscess.  I do not want to risk further AKI with contrast.  Speech a little better. Patient examined and I agree with the assessment and plan  Violeta Gelinas, MD, MPH, FACS Pager: [phone]  02/05/2013 9:34 AM

## 2013-02-06 ENCOUNTER — Inpatient Hospital Stay (HOSPITAL_COMMUNITY): Payer: 59

## 2013-02-06 DIAGNOSIS — R509 Fever, unspecified: Secondary | ICD-10-CM

## 2013-02-06 DIAGNOSIS — Z09 Encounter for follow-up examination after completed treatment for conditions other than malignant neoplasm: Secondary | ICD-10-CM

## 2013-02-06 DIAGNOSIS — J90 Pleural effusion, not elsewhere classified: Secondary | ICD-10-CM

## 2013-02-06 LAB — BASIC METABOLIC PANEL
BUN: 26 mg/dL — ABNORMAL HIGH (ref 6–23)
Calcium: 8.5 mg/dL (ref 8.4–10.5)
GFR calc Af Amer: 50 mL/min — ABNORMAL LOW (ref >90)
GFR calc non Af Amer: 43 mL/min — ABNORMAL LOW (ref >90)
Glucose, Bld: 102 mg/dL — ABNORMAL HIGH (ref 70–99)

## 2013-02-06 LAB — CBC
HCT: 25.7 % — ABNORMAL LOW (ref 39.0–52.0)
Hemoglobin: 9 g/dL — ABNORMAL LOW (ref 13.0–17.0)
MCH: 28.5 pg (ref 26.0–34.0)
MCHC: 35 g/dL (ref 30.0–36.0)
RDW: 14.2 % (ref 11.5–15.5)

## 2013-02-06 LAB — GLUCOSE, CAPILLARY
Glucose-Capillary: 118 mg/dL — ABNORMAL HIGH (ref 70–99)
Glucose-Capillary: 128 mg/dL — ABNORMAL HIGH (ref 70–99)
Glucose-Capillary: 91 mg/dL (ref 70–99)
Glucose-Capillary: 115 mg/dL — ABNORMAL HIGH (ref 70–99)

## 2013-02-06 LAB — BLOOD GAS, ARTERIAL
Acid-base deficit: 0.3 mmol/L (ref 0.0–2.0)
Drawn by: 129711
FIO2: 0.21 %
O2 Saturation: 96.3 %
Patient temperature: 101.6

## 2013-02-06 LAB — PATHOLOGIST SMEAR REVIEW

## 2013-02-06 MED ORDER — VANCOMYCIN HCL 10 G IV SOLR
2000.0000 mg | Freq: Once | INTRAVENOUS | Status: AC
  Administered 2013-02-06: 2000 mg via INTRAVENOUS
  Filled 2013-02-06: qty 2000

## 2013-02-06 MED ORDER — VANCOMYCIN HCL IN DEXTROSE 1-5 GM/200ML-% IV SOLN
1000.0000 mg | Freq: Two times a day (BID) | INTRAVENOUS | Status: DC
  Administered 2013-02-06 – 2013-02-09 (×6): 1000 mg via INTRAVENOUS
  Filled 2013-02-06 (×7): qty 200

## 2013-02-06 NOTE — Progress Notes (Signed)
Rehab admissions - I spoke with RN this am.  WBCs are up, patient with abdominal pain.  Not ready today for inpatient rehab.  I spoke with patient and reminded him of my role.  He is responsive, able to answer my questions, and a little sleepy on this visit.  I will continue to follow daily.  Call me for questions.  #[phone]

## 2013-02-06 NOTE — Progress Notes (Signed)
RN called to pt room, pt observed to be diaphoretic, c/o something being wrong, pt noted to have elevated temp,MD made aware, pt VS at baseline, nursing to cont to monitor

## 2013-02-06 NOTE — Progress Notes (Signed)
Speech Language Pathology Treatment Patient Details Name: Dakota Evans MRN: [id number] DOB: 1986/02/18 Today's Date: 02/06/2013 Time: 9562-1308 SLP Time Calculation (min): 20 min  Assessment / Plan / Recommendation Clinical Impression  Therapeutic intervention included cognitive-linguistic activities for facilitation of pt.'s comprehension, expression, attention and awareness.  Dakota Evans continues to have abdonimal pain and was unable to sit upright during session, however he was wiling and motivated to work with SLP.  Pt. identified chess pieces from a field of 6 with 70% accuracy.  He identified common objects from field of 5 with 100% without cues.  Dakota Evans named 2 room objects with 50% no cues (self corrected) and required minimal prompt for second item.  SLP was unable to practice writing during session due to more supine position.  Increased accuracy of expression of basic wants and needs (typically 1 or 2 accurate words mixed with unintended phrases).  Continue ST as pt. making great steady progress toward goals.      SLP Plan  Continue with current plan of care    Pertinent Vitals/Pain Yes, abdominal pain, RN aware  SLP Goals  SLP Goals Potential to Achieve Goals: Good Potential Considerations: Severity of impairments;Family/community support Progress/Goals/Alternative treatment plan discussed with pt/caregiver and they: Patient unable to parrticipate in goal setting;No caregivers available SLP Goal #1: Pt will follow one-step commands with 90% accuracy, given verbal, visual, and/or tactile cues. SLP Goal #1 - Progress: Progressing toward goal SLP Goal #2: Pt will answer simple/concrete yes/no questions with 90% accuracy. SLP Goal #2 - Progress: Progressing toward goal SLP Goal #3: Pt will complete automatic sequences with 90% accuracy, given verbal, visual, and/or tactile cues. SLP Goal #3 - Progress: Progressing toward goal SLP Goal #4: Pt will name common objects with 90% accuracy  given moderate verbal cues SLP Goal #4 - Progress: Progressing toward goal SLP Goal #5: Pt. will express basic needs and thoughts via multimodalities with mod verbal cues SLP Goal #5 - Progress: Progressing toward goal  General Respiratory Status: Room air Behavior/Cognition: Alert;Cooperative Oral Cavity - Dentition: Adequate natural dentition Patient Positioning: Upright in bed  Oral Cavity - Oral Hygiene Does patient have any of the following "at risk" factors?: None of the above Brush patient's teeth BID with toothbrush (using toothpaste with fluoride): Yes   Treatment Treatment focused on: Cognition;Aphasia Skilled Treatment: see impression statement   GO     Royce Macadamia M.Ed [employer] [phone]  02/06/2013
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
## 2013-02-06 NOTE — Progress Notes (Signed)
Call by the nurse for temperature spike to 101.9 and the patient stating that he does not feel right.  Examined his wound and there was some spillage of the ileostomy drainage onto the wound, but this wound not account to the fever and diaphoresis.  His would looked healthy, and there did not appear to be any bilious drainage from the wound itself.  His PICC line has not been removed yet.  Will get stat ABG, and start Vancomycin empirically.  Since he just had a CT of the Abd/Pel yesterday, I do not feel as though getting another one today is warranted.  Will come back to see him soon.  Marta Lamas. Gae Bon, MD, FACS [phone] Trauma Surgeon

## 2013-02-06 NOTE — Progress Notes (Signed)
SPEECH PATHOLOGY NOTE: LATE ENTRY FOR 7/1   02/05/13 1059  SLP Visit Information  SLP Received On 02/05/13  SLP Time Calculation  SLP Start Time 1040  SLP Stop Time 1057  SLP Time Calculation (min) 17 min  General Information  Temperature Spikes Noted No  Respiratory Status Room air  Behavior/Cognition Alert;Cooperative;Pleasant mood  Oral Cavity - Dentition Adequate natural dentition  Patient Positioning Upright in bed  Oral Cavity-Oral Hygiene  Does patient have any of the following "at risk" factors? None of the above  Brush patient's teeth BID with toothbrush (using toothpaste with fluoride) Yes  General Treatment  Treatment focused on Cognition;Aphasia  Skilled Treatment see impression statement  SLP - End of Session  Activity Tolerance Endurance does not limit participation in activity  Patient left in bed;with call bell/phone within reach  Nurse Communication (details of session)  Assessment / Recommendations / Plan  Clinical Impression Statement Dakota Evans seen for skilled treatment for facilitation of language and cognition.  RN reported he is able to express his needs with her approximately 50% of the time.  He followed one step commands with 33% accuracy due to decreased verbal comprehension as well as motor apraxia causing increased difficulty determining accurate level of comprehension.  Reading comprehension for field of 2 sentences was100% without cues for 3 trial.  Accuracy for basic yes/no questions was 50% (improved from initial evaluation).  Pt. able to state 5/7 days of the week after hearing "Monday".  Through verbalizations and gesture x 1, he expressed 2 needs/desires with minimal clarification by SLP.  Fluenst speech is characterized by decreased semantics, phonemic paraphasia and some neologisms (appears fewer than last week).  Session cut short by his abdominal pain (RN aware and pt having abdominal ultrasound today).  He is an excellent pt. for inpatient rehab to  increase independence physically and cognitively-linguistically.    Plan Continue with current plan of care  SLP Goals  Potential to Achieve Goals Good  SLP Goal #1 Pt will follow one-step commands with 90% accuracy, given verbal, visual, and/or tactile cues.  SLP Goal #1 - Progress Progressing toward goal  SLP Goal #2 Pt will answer simple/concrete yes/no questions with 90% accuracy.  SLP Goal #2 - Progress Progressing toward goal  SLP Goal #3 Pt will complete automatic sequences with 90% accuracy, given verbal, visual, and/or tactile cues.  SLP Goal #3 - Progress Progressing toward goal  SLP Goal #4 Pt will name common objects with 90% accuracy given moderate verbal cues  SLP Goal #4 - Progress Progressing toward goal  SLP Goal #5 Pt. will express basic needs and thoughts via multimodalities with mod verbal cues  SLP Goal #5 - Progress Progressing toward goal   Royce Macadamia M.Ed [employer] [phone]  02/06/2013

## 2013-02-06 NOTE — Consult Note (Signed)
WOC follow up Sttoma type/location:  RLQ, end ileostomy Stomal assessment/size: 1 inch round, budded from the skin Peristomal assessment: intact, but called for pouch change today due to the fact that the pouch is leaking into wound. Treatment options for stomal/peristomal skin: none needed Output high volume, liquid, which may be the reason for the leakage, if the pouch overfills and then the output backs up over the stoma my leak back into the wound Ostomy pouching: 1pc high output pouch placed and hooked to BSD bag to aid with drainage of the effluent Education provided:  Discussed the creation of the stoma with this patient, he seems a little cloudy on what he understands.  Will continue to educate pt on the care of his stoma.   WOC will follow along with you for ostomy care and teaching. Laycee Fitzsimmons [place] RN,CWOCN [phone]

## 2013-02-06 NOTE — Progress Notes (Signed)
VASCULAR LAB PRELIMINARY  PRELIMINARY  PRELIMINARY  PRELIMINARY  Bilateral upper and lower extremity venous Dopplers completed.    Preliminary report:  There is no DVT or SVT noted in the bilateral upper or lower extremities.  Jaekwon Mcclune, RVT 02/06/2013, 3:25 PM
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
## 2013-02-06 NOTE — Progress Notes (Signed)
OT Cancellation Note  Patient Details Name: Dakota Evans MRN: [id number] DOB: [birth year]   Cancelled Treatment:    Reason Eval/Treat Not Completed: Medical issues which prohibited therapy: increased WBC's, fever, diaphoretic, and abdominal pain. Will hold therapy until next appropriate date.  02/06/2013 Cipriano Mile OTR/L Pager [phone] Office [phone]

## 2013-02-06 NOTE — Progress Notes (Signed)
PT Cancellation Note  Patient Details Name: Grace Haggart MRN: [id number] DOB: [birth year]   Cancelled Treatment:    Reason Eval/Treat Not Completed: Medical issues which prohibited therapy; increased WBC's, fever, diaphoretic and abdominal pain.  Per RN will cancel for today.   WYNN,CYNDI 02/06/2013, 12:23 PM

## 2013-02-07 DIAGNOSIS — J90 Pleural effusion, not elsewhere classified: Secondary | ICD-10-CM | POA: Diagnosis not present

## 2013-02-07 DIAGNOSIS — R509 Fever, unspecified: Secondary | ICD-10-CM | POA: Diagnosis not present

## 2013-02-07 DIAGNOSIS — D72829 Elevated white blood cell count, unspecified: Secondary | ICD-10-CM | POA: Diagnosis present

## 2013-02-07 LAB — GLUCOSE, CAPILLARY: Glucose-Capillary: 124 mg/dL — ABNORMAL HIGH (ref 70–99)

## 2013-02-07 LAB — BASIC METABOLIC PANEL
Calcium: 8.7 mg/dL (ref 8.4–10.5)
GFR calc Af Amer: 46 mL/min — ABNORMAL LOW (ref >90)
GFR calc non Af Amer: 40 mL/min — ABNORMAL LOW (ref >90)
Glucose, Bld: 104 mg/dL — ABNORMAL HIGH (ref 70–99)
Potassium: 4.4 mEq/L (ref 3.5–5.1)
Sodium: 132 mEq/L — ABNORMAL LOW (ref 135–145)

## 2013-02-07 LAB — CBC WITH DIFFERENTIAL/PLATELET
Basophils Absolute: 0.2 10*3/uL — ABNORMAL HIGH (ref 0.0–0.1)
Eosinophils Relative: 2 % (ref 0–5)
Lymphocytes Relative: 10 % — ABNORMAL LOW (ref 12–46)
Lymphs Abs: 1.9 10*3/uL (ref 0.7–4.0)
MCV: 81.8 fL (ref 78.0–100.0)
Monocytes Relative: 16 % — ABNORMAL HIGH (ref 3–12)
Platelets: 479 10*3/uL — ABNORMAL HIGH (ref 150–400)
RBC: 3.3 MIL/uL — ABNORMAL LOW (ref 4.22–5.81)
RDW: 14.4 % (ref 11.5–15.5)
Smear Review: INCREASED
WBC: 19 10*3/uL — ABNORMAL HIGH (ref 4.0–10.5)

## 2013-02-07 MED ORDER — METRONIDAZOLE 500 MG PO TABS
500.0000 mg | ORAL_TABLET | Freq: Three times a day (TID) | ORAL | Status: DC
  Administered 2013-02-07 – 2013-02-12 (×16): 500 mg via ORAL
  Filled 2013-02-07 (×18): qty 1

## 2013-02-07 MED ORDER — CIPROFLOXACIN HCL 750 MG PO TABS
750.0000 mg | ORAL_TABLET | Freq: Two times a day (BID) | ORAL | Status: DC
  Administered 2013-02-07 – 2013-02-12 (×11): 750 mg via ORAL
  Filled 2013-02-07 (×13): qty 1

## 2013-02-07 MED ORDER — ENSURE COMPLETE PO LIQD
237.0000 mL | Freq: Three times a day (TID) | ORAL | Status: DC
  Administered 2013-02-07 – 2013-02-09 (×6): 237 mL via ORAL

## 2013-02-07 NOTE — Progress Notes (Addendum)
Physical Therapy Treatment Patient Details Name: Dakota Evans MRN: [id number] DOB: [birth year] Today's Date: 02/07/2013 Time: 4098-1191 PT Time Calculation (min): 33 min  PT Assessment / Plan / Recommendation  PT Comments   Patient progressing with mobility.  Still limited by decreased cognition and safety, though clearing some cognitively as well.  Did discuss with pt's girlfriend possibility of d/c home instead of rehab if attains higher level than they can take, but assured current plan still for d/c to rehab.  Will continue to follow acutely.  Follow Up Recommendations  CIR     Does the patient have the potential to tolerate intense rehabilitation   Yes  Barriers to Discharge  None      Equipment Recommendations  Rolling walker with 5" wheels    Recommendations for Other Services  None  Frequency Min 4X/week   Progress towards PT Goals Progress towards PT goals: Progressing toward goals  Plan Current plan remains appropriate    Precautions / Restrictions Precautions Precautions: Fall Precaution Comments: colostomy and wound on abdomen   Pertinent Vitals/Pain 4/10 in abdomen   Mobility  Bed Mobility Bed Mobility: Supine to Sit;Sit to Supine Rolling Right: 4: Min guard;With rail Right Sidelying to Sit: 4: Min assist;HOB flat;With rails Sitting - Scoot to Edge of Bed: 5: Supervision Transfers Sit to Stand: 4: Min assist;From bed;With upper extremity assist Stand to Sit: 4: Min assist;With armrests;To chair/3-in-1 Details for Transfer Assistance: initial posterior bias in standing Ambulation/Gait Ambulation/Gait Assistance: 4: Min assist Ambulation Distance (Feet): 250 Feet Assistive device: Rolling walker Ambulation/Gait Assistance Details: assist and cues due to walker veers left, at times with imbalance needing steadying assist.  Slow short steps initially, worked into longer slower steps  Gait Pattern: Step-through pattern;Decreased stride length    Exercises  General Exercises - Lower Extremity Long Arc Quad: AROM;Both;10 reps;Seated Hip Flexion/Marching: AROM;Both;Other (comment);Seated (started, but c/o increased abdominal pain so termintate)   \  PT Goals (current goals can now be found in the care plan section) Acute Rehab PT Goals Time For Goal Achievement: 02/21/13 (updated today)  Visit Information  Last PT Received On: 02/07/13 Assistance Needed: +1 PT/OT Co-Evaluation/Treatment: Yes    Subjective Data      Cognition  Cognition Arousal/Alertness: Awake/alert Behavior During Therapy: Flat affect Following Commands: Follows multi-step commands with increased time Problem Solving: Slow processing;Requires verbal cues General Comments: pt with slow processing needs repeated cues for technique, remembered needed to check to make sure had all equipment ready prior to standing today. Rancho Levels of Cognitive Functioning Rancho Los Amigos Scales of Cognitive Functioning: Automatic/appropriate    Insurance risk surveyor Sitting - Balance Support: Feet supported Static Sitting - Level of Assistance: 6: Modified independent (Device/Increase time) Static Standing Balance Static Standing - Balance Support: Bilateral upper extremity supported Static Standing - Level of Assistance: 4: Min assist Dynamic Standing Balance Dynamic Standing - Balance Support: No upper extremity supported;During functional activity Dynamic Standing - Level of Assistance: 4: Min assist (brushing teeth at sink with OT)  End of Session PT - End of Session Equipment Utilized During Treatment: Gait belt Activity Tolerance: Patient limited by fatigue Patient left: with family/visitor present;in chair;with call bell/phone within reach   GP     [facility] 02/07/2013, 1:48 PM [place], [place] [phone] 02/07/2013

## 2013-02-07 NOTE — Progress Notes (Signed)
Occupational Therapy Treatment Patient Details Name: Dakota Evans MRN: [id number] DOB: 1985/09/21 Today's Date: 02/07/2013 Time: 4098-1191 OT Time Calculation (min): 33 min  OT Assessment / Plan / Recommendation  OT comments  Pt making progress toward goals.  Continues to demonstrate cognitive deficits and need for increased assist with LB ADLs. Will continue to follow acutely.  Follow Up Recommendations  CIR    Barriers to Discharge       Equipment Recommendations  Tub/shower seat    Recommendations for Other Services Rehab consult  Frequency Min 3X/week   Progress towards OT Goals Progress towards OT goals: Progressing toward goals  Plan Discharge plan remains appropriate    Precautions / Restrictions Precautions Precautions: Fall Precaution Comments: colostomy and wound on abdomen   Pertinent Vitals/Pain See vitals    ADL  Grooming: Performed;Wash/dry hands;Teeth care;Min guard Where Assessed - Grooming: Supported standing Upper Body Dressing: Performed;Minimal assistance Where Assessed - Upper Body Dressing: Unsupported sitting Lower Body Dressing: Performed;Maximal assistance Where Assessed - Lower Body Dressing: Unsupported sitting Toilet Transfer: Simulated;Minimal assistance Toilet Transfer Method: Sit to stand Toilet Transfer Equipment:  (bed, ambulate in hall,then to chair) Toileting - Clothing Manipulation and Hygiene: Performed;Minimal assistance Where Assessed - Toileting Clothing Manipulation and Hygiene:  (sitting EOB, leaning L/R sides to lift gown to use urinal) Equipment Used: Rolling walker;Gait belt Transfers/Ambulation Related to ADLs: min guard with RW for ambulation. Min assist for sit<>stand. ADL Comments: VCs for problem solving during grooming task.  Pt bumping head when leaning forward to spit, needing cues to use cup to spit.   Required verbal cueing to turn off running water at sink.    OT Diagnosis:    OT Problem List:   OT Treatment  Interventions:     OT Goals(current goals can now be found in the care plan section) Acute Rehab OT Goals Time For Goal Achievement: 02/18/13 Potential to Achieve Goals: Good  Visit Information  Last OT Received On: 02/07/13 Assistance Needed: +1    Subjective Data      Prior Functioning       Cognition  Cognition Arousal/Alertness: Awake/alert Behavior During Therapy: Flat affect Following Commands: Follows multi-step commands with increased time Problem Solving: Slow processing;Requires verbal cues General Comments: pt with slow processing needs repeated cues for technique, remembered needed to check to make sure had all equipment ready prior to standing today. Rancho Levels of Cognitive Functioning Rancho Los Amigos Scales of Cognitive Functioning: Automatic/appropriate    Mobility  Bed Mobility Bed Mobility: Supine to Sit;Sit to Supine Rolling Right: 4: Min guard;With rail Right Sidelying to Sit: 4: Min assist;HOB flat;With rails Sitting - Scoot to Edge of Bed: 5: Supervision Transfers Transfers: Sit to Stand;Stand to Sit Sit to Stand: 4: Min assist;From bed;With upper extremity assist Stand to Sit: 4: Min assist;With armrests;To chair/3-in-1 Details for Transfer Assistance: initial posterior bias in standing    Exercises  General Exercises - Lower Extremity Long Arc Quad: AROM;Both;10 reps;Seated Hip Flexion/Marching: AROM;Both;Other (comment);Seated (started, but c/o increased abdominal pain so termintate)   Balance Static Sitting Balance Static Sitting - Balance Support: Feet supported Static Sitting - Level of Assistance: 6: Modified independent (Device/Increase time) Static Standing Balance Static Standing - Balance Support: Bilateral upper extremity supported Static Standing - Level of Assistance: 4: Min assist Dynamic Standing Balance Dynamic Standing - Balance Support: No upper extremity supported;During functional activity Dynamic Standing - Level of  Assistance: 4: Min assist (brushing teeth at sink with OT)   End of Session  OT - End of Session Equipment Utilized During Treatment: Rolling walker;Gait belt Activity Tolerance: Patient tolerated treatment well Patient left: in chair;with call bell/phone within reach;with family/visitor present Nurse Communication: Mobility status  GO    02/07/2013 Cipriano Mile OTR/L Pager [phone] Office [phone]  Cipriano Mile 02/07/2013, 1:48 PM
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
## 2013-02-07 NOTE — Progress Notes (Signed)
Rehab admissions - I spoke with Dr. Lindie Spruce and Dr. Cliffton Asters.  Patient not ready for inpatient rehab admission today or tomorrow.  Will follow up on Monday for progress.  Call me for questions.  #[phone]

## 2013-02-07 NOTE — Consult Note (Signed)
WOC ostomy follow up Stoma type/location: RLQ, end ileostomy, high output Stomal assessment/size: 1 inch oval, slightly budded Peristomal assessment: intact  Treatment options for stomal/peristomal skin:  Output high volumes of liquid output, constant during pouch change. I had placed this patient in high output pouch yesterday to attempt to keep drained all the time, however his output has more chunks in it now which is limiting the ability to keep drained into BSD.  Now leakage noted again from overfilling.  I have placed back in drainable pouch, staff will have to be diligent on emptying if they wish to keep pouch intact.  With close proximity to the wound bed also it makes pouching more challenging.  Ostomy pouching: 1pc with 2" barrier ring placed, pouch 1/3 full before I finished working with patients wound. Pt slept for pouch change.  WOC consult note Reason for Consult:apply midline VAC under retention sutures, however after 1 hour of attempting to seal around plastic retention rods I was not able to get this to seal, in combination with the crease that leads into and under the ostomy pouch Wound type:midline Measurement: 19cm x 3cm x 3cm  Wound bed: beefy red, 95% granulation tissue, some slough noted proximally  Drainage (amount, consistency, odor) minimal serosanguinous  Periwound: intact Dressing procedure/placement/frequency: if retention rods can be removed will place VAC, otherwise will need to continue NS moist gauze dressings for now.   Left VAC in room in case trauma would like to remove in am, WOC will place [facility] dressing in am.    WOC team will follow along with you for ostomy and wound management Corlene Sabia Eliberto Ivory RN,CWOCN [phone]
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
## 2013-02-07 NOTE — Progress Notes (Signed)
Good idea to get ID involved.  Still no identifiable source for fever and leukocytosis.  CPM.   Keep in SDU, not able to go to inpatient rehab yet.  This patient has been seen and I agree with the findings and treatment plan.  Marta Lamas. Gae Bon, MD, FACS [phone] (pager) [phone] (direct pager) Trauma Surgeon
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
## 2013-02-07 NOTE — Progress Notes (Signed)
Speech Language Pathology Treatment Patient Details Name: Dakota Evans MRN: [id number] DOB: 1985/10/18 Today's Date: 02/07/2013 Time: 5284-1324 SLP Time Calculation (min): 29 min  Assessment / Plan / Recommendation Clinical Impression  Pt. appears to be feeling somewhat better this a.m.  Therapy focused on communicative abilities.  Accuracy of expression of basic needs in improving and he was able to verbalize key words in the correct context (4-5 x's) for SLP to comprehend his intended message.  He wrote first name needing to copy first letter and verbal cues for one letter.  He only required extra time and min verbal cues to write last name.  He exhibited sadness/frustration and began to cry although he does not exhibit awareness/self-monitoring of verbal errors.  SLP provided him with paper and pencil to practice writning name.  Pt. given verbal cues during automatice singing and sang accurately 90% of the time in unison with SP  to familiar song.  He is making excellent progress in ST.     SLP Plan  Continue with current plan of care    Pertinent Vitals/Pain Only when moving  SLP Goals  SLP Goals Potential to Achieve Goals: Good Potential Considerations: Severity of impairments;Family/community support Progress/Goals/Alternative treatment plan discussed with pt/caregiver and they: Patient unable to parrticipate in goal setting;No caregivers available SLP Goal #1: Pt will follow one-step commands with 90% accuracy, given verbal, visual, and/or tactile cues. SLP Goal #2: Pt will answer simple/concrete yes/no questions with 90% accuracy. SLP Goal #3: Pt will complete automatic sequences with 90% accuracy, given verbal, visual, and/or tactile cues. SLP Goal #4: Pt will name common objects with 90% accuracy given moderate verbal cues SLP Goal #5: Pt. will express basic needs and thoughts via multimodalities with mod verbal cues  General Behavior/Cognition: Alert;Cooperative;Pleasant  mood;Requires cueing  Oral Cavity - Oral Hygiene Does patient have any of the following "at risk" factors?: None of the above Brush patient's teeth BID with toothbrush (using toothpaste with fluoride): Yes Patient is AT RISK - Oral Care Protocol followed (see row info): Yes   Treatment Treatment focused on: Cognition;Aphasia Skilled Treatment: see impression statement        Darrow Bussing.Ed [employer] [phone]  02/07/2013

## 2013-02-08 LAB — BASIC METABOLIC PANEL
BUN: 23 mg/dL (ref 6–23)
Creatinine, Ser: 2.09 mg/dL — ABNORMAL HIGH (ref 0.50–1.35)
GFR calc Af Amer: 49 mL/min — ABNORMAL LOW (ref >90)
GFR calc non Af Amer: 42 mL/min — ABNORMAL LOW (ref >90)
Glucose, Bld: 111 mg/dL — ABNORMAL HIGH (ref 70–99)
Potassium: 4.3 mEq/L (ref 3.5–5.1)

## 2013-02-08 LAB — CBC WITH DIFFERENTIAL/PLATELET
Basophils Relative: 0 % (ref 0–1)
Eosinophils Absolute: 0.4 10*3/uL (ref 0.0–0.7)
HCT: 26.2 % — ABNORMAL LOW (ref 39.0–52.0)
Hemoglobin: 8.9 g/dL — ABNORMAL LOW (ref 13.0–17.0)
Lymphocytes Relative: 12 % (ref 12–46)
Lymphs Abs: 2.2 10*3/uL (ref 0.7–4.0)
MCHC: 34 g/dL (ref 30.0–36.0)
MCV: 81.6 fL (ref 78.0–100.0)
Monocytes Relative: 15 % — ABNORMAL HIGH (ref 3–12)
Neutro Abs: 13.1 10*3/uL — ABNORMAL HIGH (ref 1.7–7.7)
RDW: 14.1 % (ref 11.5–15.5)

## 2013-02-08 LAB — GLUCOSE, CAPILLARY
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
## 2013-02-09 ENCOUNTER — Inpatient Hospital Stay (HOSPITAL_COMMUNITY): Payer: 59

## 2013-02-09 DIAGNOSIS — J189 Pneumonia, unspecified organism: Secondary | ICD-10-CM

## 2013-02-09 LAB — CBC WITH DIFFERENTIAL/PLATELET
Basophils Absolute: 0 10*3/uL (ref 0.0–0.1)
Eosinophils Absolute: 0.8 10*3/uL — ABNORMAL HIGH (ref 0.0–0.7)
HCT: 26.9 % — ABNORMAL LOW (ref 39.0–52.0)
Lymphs Abs: 2.2 10*3/uL (ref 0.7–4.0)
MCHC: 33.8 g/dL (ref 30.0–36.0)
MCV: 81.8 fL (ref 78.0–100.0)
Monocytes Absolute: 1.8 10*3/uL — ABNORMAL HIGH (ref 0.1–1.0)
Neutro Abs: 15.3 10*3/uL — ABNORMAL HIGH (ref 1.7–7.7)
Platelets: 477 10*3/uL — ABNORMAL HIGH (ref 150–400)
RDW: 14.2 % (ref 11.5–15.5)

## 2013-02-09 MED ORDER — BISMUTH SUBSALICYLATE 262 MG/15ML PO SUSP
30.0000 mL | Freq: Two times a day (BID) | ORAL | Status: DC
  Administered 2013-02-09 – 2013-02-10 (×2): 30 mL via ORAL
  Filled 2013-02-09: qty 236

## 2013-02-09 MED ORDER — LOPERAMIDE HCL 2 MG PO CAPS
2.0000 mg | ORAL_CAPSULE | Freq: Three times a day (TID) | ORAL | Status: DC | PRN
  Administered 2013-02-10: 2 mg via ORAL
  Filled 2013-02-09: qty 2

## 2013-02-09 MED ORDER — FERROUS SULFATE 325 (65 FE) MG PO TABS
325.0000 mg | ORAL_TABLET | Freq: Three times a day (TID) | ORAL | Status: DC
  Administered 2013-02-09 – 2013-02-12 (×8): 325 mg via ORAL
  Filled 2013-02-09 (×12): qty 1

## 2013-02-09 MED ORDER — LOPERAMIDE HCL 2 MG PO CAPS
2.0000 mg | ORAL_CAPSULE | Freq: Every day | ORAL | Status: DC
  Administered 2013-02-09: 2 mg via ORAL
  Filled 2013-02-09 (×2): qty 1

## 2013-02-09 MED ORDER — SACCHAROMYCES BOULARDII 250 MG PO CAPS
250.0000 mg | ORAL_CAPSULE | Freq: Two times a day (BID) | ORAL | Status: DC
  Administered 2013-02-09 – 2013-02-12 (×7): 250 mg via ORAL
  Filled 2013-02-09 (×8): qty 1

## 2013-02-09 MED ORDER — VANCOMYCIN HCL IN DEXTROSE 750-5 MG/150ML-% IV SOLN
750.0000 mg | Freq: Two times a day (BID) | INTRAVENOUS | Status: DC
  Administered 2013-02-10 – 2013-02-12 (×6): 750 mg via INTRAVENOUS
  Filled 2013-02-09 (×7): qty 150

## 2013-02-09 NOTE — Progress Notes (Signed)
Pt w/o complaints Appetite OK >2L ileostomy output  General: Pt awake/alert/oriented x4 in no major acute distress Eyes: PERRL, normal EOM. Sclera nonicteric Neuro: CN II-XII intact w/o focal sensory/motor deficits. Lymph: No head/neck/groin lymphadenopathy Psych:  No delerium/psychosis/paranoia HENT: Normocephalic, Mucus membranes moist.  No thrush Neck: Supple, No tracheal deviation Chest: No pain.  Good respiratory excursion.  Dec BS at base L>R CV:  Pulses intact.  Regular rhythm MS: Normal AROM mjr joints.  No obvious deformity Abdomen: Soft, Nondistended.  Nontender.  Retention sutures intact.  Superficial granulation.  No incarcerated hernias. Ext:  SCDs BLE.  No significant edema.  No cyanosis Skin: No petechiae / purpura  FUO -ID help appeciated -recheck CXR - if CXR worse, agree w more aggressive w/u such as CT chest/bronch/ poss PCCM eval -Hold off on repeat CT w Cr 2 & prior ATN given _CT 4ago - if worse, then agree w c/a/p CT

## 2013-02-09 NOTE — Progress Notes (Signed)
ANTIBIOTIC CONSULT NOTE - FOLLOW UP  Pharmacy Consult:  [place] Indication:  HCAP / pleural infection / peritonitis  No Known Allergies  Patient Measurements: Height: 6' (182.9 cm) Weight: 204 lb 2.3 oz (92.6 kg) IBW/kg (Calculated) : 77.6  Vital Signs: Temp: 98.8 F (37.1 C) (07/05 0752) Temp src: Oral (07/05 0752) BP: 150/87 mmHg (07/05 1007) Pulse Rate: 92 (07/05 0407) Intake/Output from previous day: 07/04 0701 - 07/05 0700 In: 1640 [P.O.:1440; IV Piggyback:200] Out: 5120 [Urine:2920; Stool:2200] Intake/Output from this shift: Total I/O In: -  Out: 250 [Urine:250]  Labs:  Recent Labs  02/07/13 0545 02/08/13 0450 02/08/13 1223 02/09/13 0505  WBC 19.0*  --  18.5* 20.1*  HGB 9.3*  --  8.9* 9.1*  PLT 479*  --  481* 477*  CREATININE 2.21* 2.09*  --   --    Estimated Creatinine Clearance: 59.3 ml/min (by C-G formula based on Cr of 2.09).  Recent Labs  02/09/13 0929  VANCOTROUGH 20.2*     Microbiology: Recent Results (from the past 720 hour(s))  URINE CULTURE     Status: None   Collection Time    01/17/13  3:31 AM      Result Value Range Status   Specimen Description URINE, RANDOM   Final   Special Requests NONE   Final   Culture  Setup Time 01/17/2013 10:32   Final   Colony Count NO GROWTH   Final   Culture NO GROWTH   Final   Report Status 01/18/2013 FINAL   Final  MRSA PCR SCREENING     Status: None   Collection Time    01/17/13  6:23 AM      Result Value Range Status   MRSA by PCR NEGATIVE  NEGATIVE Final   Comment:            The GeneXpert MRSA Assay (FDA     approved for NASAL specimens     only), is one component of a     comprehensive MRSA colonization     surveillance program. It is not     intended to diagnose MRSA     infection nor to guide or     monitor treatment for     MRSA infections.  CULTURE, RESPIRATORY (NON-EXPECTORATED)     Status: None   Collection Time    01/20/13  1:57 AM      Result Value Range Status   Specimen  Description TRACHEAL ASPIRATE   Final   Special Requests Normal   Final   Gram Stain     Final   Value: ABUNDANT WBC PRESENT, PREDOMINANTLY PMN     RARE SQUAMOUS EPITHELIAL CELLS PRESENT     MODERATE GRAM POSITIVE COCCI     IN PAIRS FEW GRAM NEGATIVE COCCI   Culture Non-Pathogenic Oropharyngeal-type Flora Isolated.   Final   Report Status 01/22/2013 FINAL   Final  CULTURE, BLOOD (ROUTINE X 2)     Status: None   Collection Time    01/20/13  3:00 AM      Result Value Range Status   Specimen Description BLOOD LEFT ARM   Final   Special Requests BOTTLES DRAWN AEROBIC AND ANAEROBIC 5CC EA   Final   Culture  Setup Time 01/20/2013 14:14   Final   Culture NO GROWTH 5 DAYS   Final   Report Status 01/26/2013 FINAL   Final  CULTURE, BLOOD (ROUTINE X 2)     Status: None   Collection Time  01/20/13  3:10 AM      Result Value Range Status   Specimen Description BLOOD RIGHT ARM   Final   Special Requests BOTTLES DRAWN AEROBIC ONLY 2CC   Final   Culture  Setup Time 01/20/2013 14:14   Final   Culture     Final   Value: STAPHYLOCOCCUS SPECIES (COAGULASE NEGATIVE)     Note: THE SIGNIFICANCE OF ISOLATING THIS ORGANISM FROM A SINGLE SET OF BLOOD CULTURES WHEN MULTIPLE SETS ARE DRAWN IS UNCERTAIN. PLEASE NOTIFY THE MICROBIOLOGY DEPARTMENT WITHIN ONE WEEK IF SPECIATION AND SENSITIVITIES ARE REQUIRED.     Note: Gram Stain Report Called to,Read Back By and Verified With: KATIE HYATT 01/21/13 0830 BY SMITHERSJ   Report Status 01/22/2013 FINAL   Final  URINE CULTURE     Status: None   Collection Time    01/20/13  7:24 AM      Result Value Range Status   Specimen Description URINE, CLEAN CATCH   Final   Special Requests Normal   Final   Culture  Setup Time 01/20/2013 17:13   Final   Colony Count NO GROWTH   Final   Culture NO GROWTH   Final   Report Status 01/21/2013 FINAL   Final  CULTURE, ROUTINE-ABSCESS     Status: None   Collection Time    01/22/13  8:01 AM      Result Value Range Status    Specimen Description ABSCESS ABDOMEN   Final   Special Requests NONE   Final   Gram Stain     Final   Value: ABUNDANT WBC PRESENT, PREDOMINANTLY PMN     NO SQUAMOUS EPITHELIAL CELLS SEEN     RARE GRAM NEGATIVE RODS   Culture NO GROWTH 3 DAYS   Final   Report Status 01/25/2013 FINAL   Final  URINE CULTURE     Status: None   Collection Time    01/26/13 10:30 AM      Result Value Range Status   Specimen Description URINE, CATHETERIZED   Final   Special Requests NONE   Final   Culture  Setup Time 01/26/2013 21:06   Final   Colony Count NO GROWTH   Final   Culture NO GROWTH   Final   Report Status 01/27/2013 FINAL   Final  CULTURE, BLOOD (ROUTINE X 2)     Status: None   Collection Time    02/05/13 12:25 AM      Result Value Range Status   Specimen Description BLOOD RIGHT HAND   Final   Special Requests BOTTLES DRAWN AEROBIC ONLY 10CC   Final   Culture  Setup Time 02/05/2013 05:47   Final   Culture     Final   Value:        BLOOD CULTURE RECEIVED NO GROWTH TO DATE CULTURE WILL BE HELD FOR 5 DAYS BEFORE ISSUING A FINAL NEGATIVE REPORT   Report Status PENDING   Incomplete  CULTURE, BLOOD (ROUTINE X 2)     Status: None   Collection Time    02/05/13 12:31 AM      Result Value Range Status   Specimen Description BLOOD LEFT ARM   Final   Special Requests BOTTLES DRAWN AEROBIC AND ANAEROBIC 10CC EA   Final   Culture  Setup Time 02/05/2013 05:47   Final   Culture     Final   Value:        BLOOD CULTURE RECEIVED NO GROWTH TO DATE CULTURE WILL BE  HELD FOR 5 DAYS BEFORE ISSUING A FINAL NEGATIVE REPORT   Report Status PENDING   Incomplete  BODY FLUID CULTURE     Status: None   Collection Time    02/05/13  5:41 PM      Result Value Range Status   Specimen Description PLEURAL FLUID LEFT   Final   Special Requests Normal   Final   Gram Stain     Final   Value: RARE WBC PRESENT, PREDOMINANTLY MONONUCLEAR     NO ORGANISMS SEEN   Culture NO GROWTH 2 DAYS   Final   Report Status PENDING    Incomplete  CULTURE, BLOOD (ROUTINE X 2)     Status: None   Collection Time    02/06/13  2:30 PM      Result Value Range Status   Specimen Description BLOOD RIGHT HAND   Final   Special Requests BOTTLES DRAWN AEROBIC ONLY 10CC   Final   Culture  Setup Time 02/06/2013 21:03   Final   Culture     Final   Value:        BLOOD CULTURE RECEIVED NO GROWTH TO DATE CULTURE WILL BE HELD FOR 5 DAYS BEFORE ISSUING A FINAL NEGATIVE REPORT   Report Status PENDING   Incomplete  CULTURE, BLOOD (ROUTINE X 2)     Status: None   Collection Time    02/06/13  2:40 PM      Result Value Range Status   Specimen Description BLOOD RIGHT HAND   Final   Special Requests BOTTLES DRAWN AEROBIC ONLY 10CC   Final   Culture  Setup Time 02/06/2013 21:03   Final   Culture     Final   Value:        BLOOD CULTURE RECEIVED NO GROWTH TO DATE CULTURE WILL BE HELD FOR 5 DAYS BEFORE ISSUING A FINAL NEGATIVE REPORT   Report Status PENDING   Incomplete       Assessment: 27 YOM initially started on antibiotics for wound infection and possible HCAP.  S/p multiple adjustments and currently on vancomycin, ciprofloxacin and metronidazole for questionable PICC line infection, peritonitis, and possible pleural infection.  Patient's SCr peaked at 2.55 and has started to trend down, hanging around 2 - good urine output.  Vancomycin trough is slightly supra-therapeutic.    6/15 Vanc >> 6/20, resumed 6/21 >> 6/26, resumed 7/2 >> 6/15 Clinda>> 6/15 6/15 Cefepime>> 6/15 6/15 zosyn>>6/21 6/21 primaxin >>7/1 6/20 micafungin>>6/29 7/3 Cipro >> 7/3 Flagyl >>  6/18 VT = 5.5 on 1gm q12h, SCr 0.98 7/5 VT = 20.2 on 1gm q12h, SCr 2.09  6/17 abscess>> neg 6/15 urine>>neg final  6/15 blood x2 - CoNS in 1/2 6/15 resp>>neg final.  6/12 Urine>>Neg final.  6/21 urine>> neg 7/1 pleural fluid>>ngtd  7/1 blood x 2 >> ngtd   Goal of Therapy:  Vancomycin trough level 15-20 mcg/ml   Plan:  - Change Vanc to 750mg  IV Q12H - Cipro 750mg  PO  BID and Flagyl 500mg  PO TID as ordered - Monitor renal fxn, clinical course, VT to r/o accumulation    Rhianna Raulerson D. Laney Potash, PharmD, BCPS Pager:  [phone] 02/09/2013, 11:24 AM
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
## 2013-02-09 NOTE — Progress Notes (Signed)
This note also relates to the following rows which could not be included: ECG Heart Rate - Cannot attach notes to unvalidated device data Resp - Cannot attach notes to unvalidated device data   Tylenol given
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
## 2013-02-10 LAB — RHEUMATOID FACTOR: Rhuematoid fact SerPl-aCnc: <10 IU/mL (ref <=14)

## 2013-02-10 LAB — C-REACTIVE PROTEIN: CRP: 5.6 mg/dL — ABNORMAL HIGH (ref <0.60)

## 2013-02-10 LAB — HEPATITIS PANEL, ACUTE
HCV Ab: NEGATIVE
Hepatitis B Surface Ag: NEGATIVE

## 2013-02-10 LAB — BASIC METABOLIC PANEL
BUN: 17 mg/dL (ref 6–23)
CO2: 22 mEq/L (ref 19–32)
Chloride: 94 mEq/L — ABNORMAL LOW (ref 96–112)
GFR calc Af Amer: 46 mL/min — ABNORMAL LOW (ref >90)
Potassium: 4.2 mEq/L (ref 3.5–5.1)

## 2013-02-10 LAB — CBC
HCT: 26.4 % — ABNORMAL LOW (ref 39.0–52.0)
Hemoglobin: 8.8 g/dL — ABNORMAL LOW (ref 13.0–17.0)
MCH: 27.2 pg (ref 26.0–34.0)
MCHC: 33.3 g/dL (ref 30.0–36.0)
MCV: 81.7 fL (ref 78.0–100.0)
Platelets: 500 10*3/uL — ABNORMAL HIGH (ref 150–400)
RBC: 3.23 MIL/uL — ABNORMAL LOW (ref 4.22–5.81)
RDW: 14 % (ref 11.5–15.5)
WBC: 17.5 10*3/uL — ABNORMAL HIGH (ref 4.0–10.5)

## 2013-02-10 LAB — CK: Total CK: 126 U/L (ref 7–232)

## 2013-02-10 MED ORDER — ACETAMINOPHEN 500 MG PO TABS
1000.0000 mg | ORAL_TABLET | Freq: Three times a day (TID) | ORAL | Status: DC
  Administered 2013-02-10 – 2013-02-11 (×5): 1000 mg via ORAL
  Filled 2013-02-10 (×9): qty 2

## 2013-02-10 MED ORDER — LOPERAMIDE HCL 2 MG PO CAPS
4.0000 mg | ORAL_CAPSULE | Freq: Every day | ORAL | Status: DC
  Administered 2013-02-10 – 2013-02-11 (×2): 4 mg via ORAL
  Filled 2013-02-10 (×3): qty 2

## 2013-02-10 MED ORDER — LOPERAMIDE HCL 2 MG PO CAPS
2.0000 mg | ORAL_CAPSULE | Freq: Three times a day (TID) | ORAL | Status: DC | PRN
  Filled 2013-02-10: qty 2

## 2013-02-10 MED ORDER — LACTATED RINGERS IV BOLUS (SEPSIS): 1000.0000 mL | Freq: Three times a day (TID) | INTRAVENOUS | Status: DC | PRN

## 2013-02-10 NOTE — Plan of Care (Signed)
Problem: Consults Goal: Nutrition Consult-if indicated Outcome: Progressing Patient not tolerating ensure nutritional supplement but has been eating >50% of daily meals. Patient's family has been bring in meals as well
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
## 2013-02-10 NOTE — Progress Notes (Signed)
Patient does not need a CT of any kind with contrast with his renal function being abnormal.  If he were looking septic I would be more inclined to do some study.  He has a large left effusion, and I agree that this could be tapped again, but would not place chest tube without consulting CVTS because then I would be thinking about the possibility of empyema.  His Tmax over the last 24 hours has been only 99.9.  WBC pending.

## 2013-02-10 NOTE — Progress Notes (Addendum)
Pt w/o complaints  Appetite OK.  Good PO intake Lower ileostomy output on iron/pepto/loperamide but nausea w pepto  General: Pt awake/alert/oriented x4 in no major acute distress  Eyes: PERRL, normal EOM. Sclera nonicteric  Neuro: CN II-XII intact w/o focal sensory/motor deficits.  Lymph: No head/neck/groin lymphadenopathy  Psych: No delerium/psychosis/paranoia.  Minimal expressive aphasia now  HENT: Normocephalic, Mucus membranes moist. No thrush  Neck: Supple, No tracheal deviation  Chest: No pain. Good respiratory excursion. Dec BS at base L>>R  CV: Pulses intact. Regular rhythm  MS: Normal AROM mjr joints. No obvious deformity  Abdomen: Soft, Nondistended. Nontender. Retention sutures intact. Wound clean w good granulation. No incarcerated hernias.  Ext: SCDs BLE. No significant edema. No cyanosis  Skin: No petechiae / purpura   -ID help appeciated  -CXR with returning effusion - may need to retap or place Chest tube if WBC increasing.  If that does not help, agree w more aggressive w/u such as CT chest/bronch/ poss PCCM eval  -Hold off on repeat CT w Cr 2 & prior ATN given neg CT 4ago and abnormality elsewhere (effusion L chest) - if worse, then agree w c/a/p CT  -d/c pepto.  Adjust loperamide.  Follow ileostomy output to avoid worsening dehydration.  Try to wean off IVF if K/Cr OK today -anemia - iron -wean off standing nausea meds & follow PO -retention sutures x4-6 weeks given poor fascia quality per Dr Lindie Spruce

## 2013-02-11 ENCOUNTER — Inpatient Hospital Stay (HOSPITAL_COMMUNITY): Payer: 59

## 2013-02-11 DIAGNOSIS — S36899A Unspecified injury of other intra-abdominal organs, initial encounter: Secondary | ICD-10-CM

## 2013-02-11 DIAGNOSIS — W320XXA Accidental handgun discharge, initial encounter: Secondary | ICD-10-CM

## 2013-02-11 DIAGNOSIS — D72829 Elevated white blood cell count, unspecified: Secondary | ICD-10-CM

## 2013-02-11 LAB — CULTURE, BLOOD (ROUTINE X 2)

## 2013-02-11 LAB — CBC
HCT: 27.2 % — ABNORMAL LOW (ref 39.0–52.0)
Hemoglobin: 9.1 g/dL — ABNORMAL LOW (ref 13.0–17.0)
WBC: 18.2 10*3/uL — ABNORMAL HIGH (ref 4.0–10.5)

## 2013-02-11 LAB — HIV-1 RNA QUANT-NO REFLEX-BLD: HIV-1 RNA Quant, Log: <1.3 {Log} (ref <1.30)

## 2013-02-11 LAB — BASIC METABOLIC PANEL
BUN: 16 mg/dL (ref 6–23)
Chloride: 97 mEq/L (ref 96–112)
Glucose, Bld: 109 mg/dL — ABNORMAL HIGH (ref 70–99)
Potassium: 3.8 mEq/L (ref 3.5–5.1)

## 2013-02-11 NOTE — Consult Note (Signed)
Noted ostomy appliance just changed by bedside nursing staff. Will follow up with next pouch change for education with pt., not sure with his aphasia how much he will be able to retain.  WOC will follow along with you for ostomy care and teaching. Pao Haffey [place] RN,CWOCN [phone]

## 2013-02-11 NOTE — Progress Notes (Signed)
Rehab admissions - Continues to progress with therapies.  Ongoing medical issues.  Once medically ready, I will look at possible need for inpatient rehab.  He is doing well and may not need the full inpatient rehab program given current functional progress.  Call me for questions.  #[phone]

## 2013-02-11 NOTE — Progress Notes (Signed)
UR completed 
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
## 2013-02-11 NOTE — Progress Notes (Signed)
Patient has been transferred to 6N bed 32 via wheelchair. Phone report was called Alona Bene. Family members present and aware of the transfer.
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
## 2013-02-12 ENCOUNTER — Encounter (HOSPITAL_BASED_OUTPATIENT_CLINIC_OR_DEPARTMENT_OTHER): Payer: Self-pay

## 2013-02-12 ENCOUNTER — Inpatient Hospital Stay (HOSPITAL_COMMUNITY)
Admission: RE | Admit: 2013-02-12 | Discharge: 2013-02-20 | DRG: 945 | Disposition: A | Discharge status: Home Health | Payer: 59 | Source: Intra-hospital | Attending: Physical Medicine & Rehabilitation | Admitting: Physical Medicine & Rehabilitation

## 2013-02-12 DIAGNOSIS — R000 Tachycardia, unspecified: Secondary | ICD-10-CM

## 2013-02-12 DIAGNOSIS — S069XAA Unspecified intracranial injury with loss of consciousness status unknown, initial encounter: Secondary | ICD-10-CM

## 2013-02-12 DIAGNOSIS — S36118A Other injury of liver, initial encounter: Secondary | ICD-10-CM

## 2013-02-12 DIAGNOSIS — Y838 Other surgical procedures as the cause of abnormal reaction of the patient, or of later complication, without mention of misadventure at the time of the procedure: Secondary | ICD-10-CM

## 2013-02-12 DIAGNOSIS — W3400XD Accidental discharge from unspecified firearms or gun, subsequent encounter: Secondary | ICD-10-CM

## 2013-02-12 DIAGNOSIS — J90 Pleural effusion, not elsewhere classified: Secondary | ICD-10-CM | POA: Diagnosis present

## 2013-02-12 DIAGNOSIS — S065XAA Traumatic subdural hemorrhage with loss of consciousness status unknown, initial encounter: Secondary | ICD-10-CM

## 2013-02-12 DIAGNOSIS — N289 Disorder of kidney and ureter, unspecified: Secondary | ICD-10-CM

## 2013-02-12 DIAGNOSIS — E871 Hypo-osmolality and hyponatremia: Secondary | ICD-10-CM

## 2013-02-12 DIAGNOSIS — R4701 Aphasia: Secondary | ICD-10-CM

## 2013-02-12 DIAGNOSIS — K631 Perforation of intestine (nontraumatic): Secondary | ICD-10-CM | POA: Diagnosis present

## 2013-02-12 DIAGNOSIS — J189 Pneumonia, unspecified organism: Secondary | ICD-10-CM

## 2013-02-12 DIAGNOSIS — S069X9A Unspecified intracranial injury with loss of consciousness of unspecified duration, initial encounter: Secondary | ICD-10-CM

## 2013-02-12 DIAGNOSIS — R5381 Other malaise: Secondary | ICD-10-CM

## 2013-02-12 DIAGNOSIS — S06369D Traumatic hemorrhage of cerebrum, unspecified, with loss of consciousness of unspecified duration, subsequent encounter: Secondary | ICD-10-CM

## 2013-02-12 DIAGNOSIS — S36599A Other injury of unspecified part of colon, initial encounter: Secondary | ICD-10-CM

## 2013-02-12 DIAGNOSIS — S06369A Traumatic hemorrhage of cerebrum, unspecified, with loss of consciousness of unspecified duration, initial encounter: Secondary | ICD-10-CM

## 2013-02-12 DIAGNOSIS — S36119A Unspecified injury of liver, initial encounter: Secondary | ICD-10-CM

## 2013-02-12 DIAGNOSIS — R630 Anorexia: Secondary | ICD-10-CM

## 2013-02-12 DIAGNOSIS — Y249XXA Unspecified firearm discharge, undetermined intent, initial encounter: Secondary | ICD-10-CM

## 2013-02-12 DIAGNOSIS — R509 Fever, unspecified: Secondary | ICD-10-CM | POA: Diagnosis present

## 2013-02-12 DIAGNOSIS — S21309A Unspecified open wound of unspecified front wall of thorax with penetration into thoracic cavity, initial encounter: Secondary | ICD-10-CM

## 2013-02-12 DIAGNOSIS — D62 Acute posthemorrhagic anemia: Secondary | ICD-10-CM | POA: Diagnosis present

## 2013-02-12 DIAGNOSIS — Z932 Ileostomy status: Secondary | ICD-10-CM

## 2013-02-12 DIAGNOSIS — N170 Acute kidney failure with tubular necrosis: Secondary | ICD-10-CM

## 2013-02-12 DIAGNOSIS — R188 Other ascites: Secondary | ICD-10-CM

## 2013-02-12 DIAGNOSIS — S0636AA Traumatic hemorrhage of cerebrum, unspecified, with loss of consciousness status unknown, initial encounter: Secondary | ICD-10-CM

## 2013-02-12 DIAGNOSIS — S27809A Unspecified injury of diaphragm, initial encounter: Secondary | ICD-10-CM

## 2013-02-12 DIAGNOSIS — S31139D Puncture wound of abdominal wall without foreign body, unspecified quadrant without penetration into peritoneal cavity, subsequent encounter: Secondary | ICD-10-CM

## 2013-02-12 DIAGNOSIS — Z79899 Other long term (current) drug therapy: Secondary | ICD-10-CM

## 2013-02-12 DIAGNOSIS — Z5189 Encounter for other specified aftercare: Principal | ICD-10-CM

## 2013-02-12 DIAGNOSIS — D72829 Elevated white blood cell count, unspecified: Secondary | ICD-10-CM | POA: Diagnosis present

## 2013-02-12 DIAGNOSIS — S0190XA Unspecified open wound of unspecified part of head, initial encounter: Secondary | ICD-10-CM

## 2013-02-12 DIAGNOSIS — S3669XA Other injury of rectum, initial encounter: Secondary | ICD-10-CM

## 2013-02-12 DIAGNOSIS — T8140XA Infection following a procedure, unspecified, initial encounter: Secondary | ICD-10-CM

## 2013-02-12 LAB — BASIC METABOLIC PANEL
BUN: 15 mg/dL (ref 6–23)
Calcium: 8.9 mg/dL (ref 8.4–10.5)
GFR calc non Af Amer: 47 mL/min — ABNORMAL LOW (ref >90)
Glucose, Bld: 108 mg/dL — ABNORMAL HIGH (ref 70–99)
Potassium: 4.1 mEq/L (ref 3.5–5.1)

## 2013-02-12 LAB — CBC
HCT: 27.8 % — ABNORMAL LOW (ref 39.0–52.0)
Hemoglobin: 9.3 g/dL — ABNORMAL LOW (ref 13.0–17.0)
MCH: 27.2 pg (ref 26.0–34.0)
MCHC: 33.5 g/dL (ref 30.0–36.0)
MCV: 81.3 fL (ref 78.0–100.0)

## 2013-02-12 LAB — CULTURE, BLOOD (ROUTINE X 2): Culture: NO GROWTH

## 2013-02-12 LAB — EPSTEIN-BARR VIRUS VCA ANTIBODY PANEL
EBV EA IgG: <5 U/mL (ref <9.0)
EBV VCA IgG: >750 U/mL — ABNORMAL HIGH (ref <18.0)
EBV VCA IgM: <10 U/mL (ref <36.0)

## 2013-02-12 MED ORDER — ZOLPIDEM TARTRATE 5 MG PO TABS
5.0000 mg | ORAL_TABLET | Freq: Every day | ORAL | Status: DC
  Administered 2013-02-12 – 2013-02-19 (×8): 5 mg via ORAL
  Filled 2013-02-12 (×9): qty 1

## 2013-02-12 MED ORDER — ACETAMINOPHEN 325 MG PO TABS: 325.0000 mg | ORAL_TABLET | ORAL | Status: DC | PRN

## 2013-02-12 MED ORDER — GUAIFENESIN-DM 100-10 MG/5ML PO SYRP: 5.0000 mL | ORAL_SOLUTION | Freq: Four times a day (QID) | ORAL | Status: DC | PRN

## 2013-02-12 MED ORDER — ONDANSETRON HCL 4 MG PO TABS: 4.0000 mg | ORAL_TABLET | ORAL | Status: DC | PRN

## 2013-02-12 MED ORDER — ADULT MULTIVITAMIN W/MINERALS CH
1.0000 | ORAL_TABLET | Freq: Every day | ORAL | Status: DC
  Administered 2013-02-13 – 2013-02-20 (×8): 1 via ORAL
  Filled 2013-02-12 (×9): qty 1

## 2013-02-12 MED ORDER — TRAMADOL HCL 50 MG PO TABS: 50.0000 mg | ORAL_TABLET | Freq: Four times a day (QID) | ORAL | Status: DC | PRN

## 2013-02-12 MED ORDER — METRONIDAZOLE 500 MG PO TABS
500.0000 mg | ORAL_TABLET | Freq: Three times a day (TID) | ORAL | Status: DC
  Administered 2013-02-12 – 2013-02-14 (×5): 500 mg via ORAL
  Filled 2013-02-12 (×8): qty 1

## 2013-02-12 MED ORDER — ENOXAPARIN SODIUM 40 MG/0.4ML ~~LOC~~ SOLN
40.0000 mg | SUBCUTANEOUS | Status: DC
  Administered 2013-02-13 – 2013-02-19 (×7): 40 mg via SUBCUTANEOUS
  Filled 2013-02-12 (×9): qty 0.4

## 2013-02-12 MED ORDER — VANCOMYCIN HCL IN DEXTROSE 750-5 MG/150ML-% IV SOLN
750.0000 mg | Freq: Two times a day (BID) | INTRAVENOUS | Status: DC
  Administered 2013-02-12 – 2013-02-14 (×4): 750 mg via INTRAVENOUS
  Filled 2013-02-12 (×5): qty 150

## 2013-02-12 MED ORDER — FERROUS SULFATE 325 (65 FE) MG PO TABS
325.0000 mg | ORAL_TABLET | Freq: Three times a day (TID) | ORAL | Status: DC
  Administered 2013-02-13 – 2013-02-20 (×24): 325 mg via ORAL
  Filled 2013-02-12 (×26): qty 1

## 2013-02-12 MED ORDER — TRAMADOL HCL 50 MG PO TABS
50.0000 mg | ORAL_TABLET | Freq: Four times a day (QID) | ORAL | Status: DC | PRN
  Administered 2013-02-15: 100 mg via ORAL
  Filled 2013-02-12: qty 2

## 2013-02-12 MED ORDER — HYDROCODONE-ACETAMINOPHEN 5-325 MG PO TABS
1.0000 | ORAL_TABLET | ORAL | Status: DC | PRN
  Administered 2013-02-13 – 2013-02-14 (×5): 1 via ORAL
  Administered 2013-02-14: 2 via ORAL
  Administered 2013-02-15: 1 via ORAL
  Administered 2013-02-15: 2 via ORAL
  Administered 2013-02-15: 1 via ORAL
  Administered 2013-02-16 – 2013-02-17 (×6): 2 via ORAL
  Administered 2013-02-17: 1 via ORAL
  Administered 2013-02-17 – 2013-02-18 (×2): 2 via ORAL
  Administered 2013-02-18: 1 via ORAL
  Administered 2013-02-18: 2 via ORAL
  Administered 2013-02-18 – 2013-02-19 (×2): 1 via ORAL
  Administered 2013-02-19 (×2): 2 via ORAL
  Administered 2013-02-20: 1 via ORAL
  Filled 2013-02-12 (×2): qty 1
  Filled 2013-02-12 (×2): qty 2
  Filled 2013-02-12: qty 1
  Filled 2013-02-12 (×2): qty 2
  Filled 2013-02-12 (×2): qty 1
  Filled 2013-02-12 (×4): qty 2
  Filled 2013-02-12 (×2): qty 1
  Filled 2013-02-12 (×5): qty 2
  Filled 2013-02-12 (×2): qty 1
  Filled 2013-02-12 (×2): qty 2
  Filled 2013-02-12 (×2): qty 1
  Filled 2013-02-12 (×2): qty 2

## 2013-02-12 MED ORDER — ALBUTEROL SULFATE (5 MG/ML) 0.5% IN NEBU: 2.5000 mg | INHALATION_SOLUTION | Freq: Four times a day (QID) | RESPIRATORY_TRACT | Status: DC | PRN

## 2013-02-12 MED ORDER — DIPHENHYDRAMINE HCL 12.5 MG/5ML PO ELIX: 12.5000 mg | ORAL_SOLUTION | Freq: Four times a day (QID) | ORAL | Status: DC | PRN

## 2013-02-12 MED ORDER — SACCHAROMYCES BOULARDII 250 MG PO CAPS
250.0000 mg | ORAL_CAPSULE | Freq: Two times a day (BID) | ORAL | Status: DC
  Administered 2013-02-12 – 2013-02-20 (×16): 250 mg via ORAL
  Filled 2013-02-12 (×18): qty 1

## 2013-02-12 MED ORDER — GATORADE (BH)
240.0000 mL | Freq: Four times a day (QID) | ORAL | Status: DC
  Administered 2013-02-13 – 2013-02-20 (×26): 240 mL via ORAL
  Filled 2013-02-12: qty 480

## 2013-02-12 MED ORDER — CIPROFLOXACIN HCL 750 MG PO TABS
750.0000 mg | ORAL_TABLET | Freq: Two times a day (BID) | ORAL | Status: DC
  Administered 2013-02-12 – 2013-02-14 (×4): 750 mg via ORAL
  Filled 2013-02-12 (×6): qty 1

## 2013-02-12 MED ORDER — ATENOLOL 25 MG PO TABS
25.0000 mg | ORAL_TABLET | Freq: Two times a day (BID) | ORAL | Status: DC
  Administered 2013-02-12 – 2013-02-20 (×16): 25 mg via ORAL
  Filled 2013-02-12 (×18): qty 1

## 2013-02-12 MED ORDER — METHOCARBAMOL 500 MG PO TABS: 500.0000 mg | ORAL_TABLET | Freq: Four times a day (QID) | ORAL | Status: DC | PRN

## 2013-02-12 MED ORDER — IPRATROPIUM BROMIDE 0.02 % IN SOLN: 0.5000 mg | Freq: Four times a day (QID) | RESPIRATORY_TRACT | Status: DC | PRN

## 2013-02-12 MED ORDER — ONDANSETRON HCL 4 MG/2ML IJ SOLN: 4.0000 mg | INTRAMUSCULAR | Status: DC | PRN

## 2013-02-12 MED ORDER — ACETAMINOPHEN 325 MG PO TABS: 650.0000 mg | ORAL_TABLET | ORAL | Status: DC | PRN

## 2013-02-12 MED ORDER — ENSURE COMPLETE PO LIQD
237.0000 mL | Freq: Three times a day (TID) | ORAL | Status: DC
  Administered 2013-02-14 – 2013-02-19 (×6): 237 mL via ORAL

## 2013-02-12 MED ORDER — ALUM & MAG HYDROXIDE-SIMETH 200-200-20 MG/5ML PO SUSP: 30.0000 mL | ORAL | Status: DC | PRN

## 2013-02-12 MED ORDER — LOPERAMIDE HCL 2 MG PO CAPS
4.0000 mg | ORAL_CAPSULE | Freq: Every day | ORAL | Status: DC
  Administered 2013-02-12 – 2013-02-19 (×8): 4 mg via ORAL
  Filled 2013-02-12 (×9): qty 2

## 2013-02-12 NOTE — Progress Notes (Signed)
2240 Patient lying in bed diaphoretic large beads of sweat rolling off forehead bed and clothing damp.Temp. 99 orally denies any discomfort .Patient has eaten very little of dinner family brought in sandwich from home hasn't touched it yet.Checked blood sugar 119. Clothing and linen changed encouraged patient to eat some of sandwich .Will continue to monitor patient .Patient grandfather at bedside with plans to stay the night.

## 2013-02-12 NOTE — Progress Notes (Signed)
ANTIBIOTIC CONSULT NOTE - FOLLOW UP  Pharmacy Consult:  Vancomcyin/Ciprofloxacin and Metronidazole Indication:  HCAP / pleural infection / peritonitis  No Known Allergies  Assessment: 27 YOM initially started on antibiotics for wound infection and possible HCAP.  S/p multiple adjustments and currently on vancomycin, ciprofloxacin and metronidazole for questionable PICC line infection, peritonitis, and possible pleural infection.  Patient's SCr peaked at 2.55 and has started to trend down, and today is 1.91 with - good urine output.  He is negative 2.7 L in the last 24 hours.  His leukocytosis is persistent with WBC at 18.4K.  No new imaging study today and cxr yesterday revealed no new changes.  Culture data reveals no growth in pleural fluid and blood x 2.  6/15 Vanc >> 6/20, resumed 6/21 >> 6/26, resumed 7/2 >> 6/15 Clinda>> 6/15 6/15 Cefepime>> 6/15 6/15 zosyn>>6/21 6/21 primaxin >>7/1 6/20 micafungin>>6/29 7/3 Cipro >> 7/3 Flagyl >>  6/18 VT = 5.5 on 1gm q12h, SCr 0.98 7/5 VT = 20.2 on 1gm q12h, SCr 2.09  6/17 abscess>> neg 6/15 urine>>neg final  6/15 blood x2 - CoNS in 1/2 6/15 resp>>neg final.  6/12 Urine>>Neg final.  6/21 urine>> neg 7/1 pleural fluid>>NG - final  7/1 blood x 2 >> NG - final 7/2 blood x 2 >> NG - final  Goal of Therapy:  Vancomycin trough level 15-20 mcg/ml  Plan:  - Continue Vanc at 750mg  IV Q12H - Continue Cipro 750mg  PO BID and Flagyl 500mg  PO TID as ordered - Monitor renal fxn, clinical course, VT to r/o accumulation  Patient Measurements: Height: 6' (182.9 cm) Weight: 204 lb 2.3 oz (92.6 kg) IBW/kg (Calculated) : 77.6  Vital Signs: Temp: 98.9 F (37.2 C) (07/08 0615) Temp src: Oral (07/08 0615) BP: 144/75 mmHg (07/08 0615) Pulse Rate: 100 (07/08 0615) Intake/Output from previous day: 07/07 0701 - 07/08 0700 In: 619.2 [P.O.:360; I.V.:109.2; IV Piggyback:150] Out: 3000 [Urine:1700; Stool:1300] Intake/Output from this shift:     Labs:  Recent Labs  02/10/13 0940 02/11/13 0355 02/12/13 0440  WBC 17.5* 18.2* 18.4*  HGB 8.8* 9.1* 9.3*  PLT 500* 516* 520*  CREATININE 2.21* 1.95* 1.91*   Estimated Creatinine Clearance: 64.9 ml/min (by C-G formula based on Cr of 1.91). No results found for this basename: VANCOTROUGH, Leodis Binet, VANCORANDOM, GENTTROUGH, GENTPEAK, GENTRANDOM, TOBRATROUGH, TOBRAPEAK, TOBRARND, AMIKACINPEAK, AMIKACINTROU, AMIKACIN,  in the last 72 hours   Microbiology: Recent Results (from the past 720 hour(s))  URINE CULTURE     Status: None   Collection Time    01/17/13  3:31 AM      Result Value Range Status   Specimen Description URINE, RANDOM   Final   Special Requests NONE   Final   Culture  Setup Time 01/17/2013 10:32   Final   Colony Count NO GROWTH   Final   Culture NO GROWTH   Final   Report Status 01/18/2013 FINAL   Final  MRSA PCR SCREENING     Status: None   Collection Time    01/17/13  6:23 AM      Result Value Range Status   MRSA by PCR NEGATIVE  NEGATIVE Final   Comment:            The GeneXpert MRSA Assay (FDA     approved for NASAL specimens     only), is one component of a     comprehensive MRSA colonization     surveillance program. It is not     intended to diagnose  MRSA     infection nor to guide or     monitor treatment for     MRSA infections.  CULTURE, RESPIRATORY (NON-EXPECTORATED)     Status: None   Collection Time    01/20/13  1:57 AM      Result Value Range Status   Specimen Description TRACHEAL ASPIRATE   Final   Special Requests Normal   Final   Gram Stain     Final   Value: ABUNDANT WBC PRESENT, PREDOMINANTLY PMN     RARE SQUAMOUS EPITHELIAL CELLS PRESENT     MODERATE GRAM POSITIVE COCCI     IN PAIRS FEW GRAM NEGATIVE COCCI   Culture Non-Pathogenic Oropharyngeal-type Flora Isolated.   Final   Report Status 01/22/2013 FINAL   Final  CULTURE, BLOOD (ROUTINE X 2)     Status: None   Collection Time    01/20/13  3:00 AM      Result Value Range  Status   Specimen Description BLOOD LEFT ARM   Final   Special Requests BOTTLES DRAWN AEROBIC AND ANAEROBIC 5CC EA   Final   Culture  Setup Time 01/20/2013 14:14   Final   Culture NO GROWTH 5 DAYS   Final   Report Status 01/26/2013 FINAL   Final  CULTURE, BLOOD (ROUTINE X 2)     Status: None   Collection Time    01/20/13  3:10 AM      Result Value Range Status   Specimen Description BLOOD RIGHT ARM   Final   Special Requests BOTTLES DRAWN AEROBIC ONLY 2CC   Final   Culture  Setup Time 01/20/2013 14:14   Final   Culture     Final   Value: STAPHYLOCOCCUS SPECIES (COAGULASE NEGATIVE)     Note: THE SIGNIFICANCE OF ISOLATING THIS ORGANISM FROM A SINGLE SET OF BLOOD CULTURES WHEN MULTIPLE SETS ARE DRAWN IS UNCERTAIN. PLEASE NOTIFY THE MICROBIOLOGY DEPARTMENT WITHIN ONE WEEK IF SPECIATION AND SENSITIVITIES ARE REQUIRED.     Note: Gram Stain Report Called to,Read Back By and Verified With: KATIE HYATT 01/21/13 0830 BY SMITHERSJ   Report Status 01/22/2013 FINAL   Final  URINE CULTURE     Status: None   Collection Time    01/20/13  7:24 AM      Result Value Range Status   Specimen Description URINE, CLEAN CATCH   Final   Special Requests Normal   Final   Culture  Setup Time 01/20/2013 17:13   Final   Colony Count NO GROWTH   Final   Culture NO GROWTH   Final   Report Status 01/21/2013 FINAL   Final  CULTURE, ROUTINE-ABSCESS     Status: None   Collection Time    01/22/13  8:01 AM      Result Value Range Status   Specimen Description ABSCESS ABDOMEN   Final   Special Requests NONE   Final   Gram Stain     Final   Value: ABUNDANT WBC PRESENT, PREDOMINANTLY PMN     NO SQUAMOUS EPITHELIAL CELLS SEEN     RARE GRAM NEGATIVE RODS   Culture NO GROWTH 3 DAYS   Final   Report Status 01/25/2013 FINAL   Final  URINE CULTURE     Status: None   Collection Time    01/26/13 10:30 AM      Result Value Range Status   Specimen Description URINE, CATHETERIZED   Final   Special Requests NONE   Final  Culture  Setup Time 01/26/2013 21:06   Final   Colony Count NO GROWTH   Final   Culture NO GROWTH   Final   Report Status 01/27/2013 FINAL   Final  CULTURE, BLOOD (ROUTINE X 2)     Status: None   Collection Time    02/05/13 12:25 AM      Result Value Range Status   Specimen Description BLOOD RIGHT HAND   Final   Special Requests BOTTLES DRAWN AEROBIC ONLY 10CC   Final   Culture  Setup Time 02/05/2013 05:47   Final   Culture NO GROWTH 5 DAYS   Final   Report Status 02/11/2013 FINAL   Final  CULTURE, BLOOD (ROUTINE X 2)     Status: None   Collection Time    02/05/13 12:31 AM      Result Value Range Status   Specimen Description BLOOD LEFT ARM   Final   Special Requests BOTTLES DRAWN AEROBIC AND ANAEROBIC 10CC EA   Final   Culture  Setup Time 02/05/2013 05:47   Final   Culture NO GROWTH 5 DAYS   Final   Report Status 02/11/2013 FINAL   Final  BODY FLUID CULTURE     Status: None   Collection Time    02/05/13  5:41 PM      Result Value Range Status   Specimen Description PLEURAL FLUID LEFT   Final   Special Requests Normal   Final   Gram Stain     Final   Value: RARE WBC PRESENT, PREDOMINANTLY MONONUCLEAR     NO ORGANISMS SEEN   Culture NO GROWTH 3 DAYS   Final   Report Status 02/10/2013 FINAL   Final  CULTURE, BLOOD (ROUTINE X 2)     Status: None   Collection Time    02/06/13  2:30 PM      Result Value Range Status   Specimen Description BLOOD RIGHT HAND   Final   Special Requests BOTTLES DRAWN AEROBIC ONLY 10CC   Final   Culture  Setup Time 02/06/2013 21:03   Final   Culture NO GROWTH 5 DAYS   Final   Report Status 02/12/2013 FINAL   Final  CULTURE, BLOOD (ROUTINE X 2)     Status: None   Collection Time    02/06/13  2:40 PM      Result Value Range Status   Specimen Description BLOOD RIGHT HAND   Final   Special Requests BOTTLES DRAWN AEROBIC ONLY 10CC   Final   Culture  Setup Time 02/06/2013 21:03   Final   Culture NO GROWTH 5 DAYS   Final   Report Status 02/12/2013  FINAL   Final   Nadara Mustard, PharmD., MS Clinical Pharmacist Pager:  [phone] Thank you for allowing pharmacy to be part of this patients care team. 02/12/2013, 9:36 AM

## 2013-02-12 NOTE — Progress Notes (Signed)
ANTIBIOTIC CONSULT NOTE - FOLLOW UP  Pharmacy Consult: Vancomcyin/Ciprofloxacin and Metronidazole  Indication: HCAP / pleural infection / peritonitis  No Known Allergies  Patient Measurements: Wt= 92.6kg  Vital Signs: Temp: 97.4 F (36.3 C) (07/08 1815) Temp src: Oral (07/08 1815) BP: 129/86 mmHg (07/08 1815) Pulse Rate: 102 (07/08 1815) Intake/Output from previous day:   Intake/Output from this shift:    Labs:  Recent Labs  02/10/13 0940 02/11/13 0355 02/12/13 0440  WBC 17.5* 18.2* 18.4*  HGB 8.8* 9.1* 9.3*  PLT 500* 516* 520*  CREATININE 2.21* 1.95* 1.91*   The CrCl is unknown because both a height and weight (above a minimum accepted value) are required for this calculation. No results found for this basename: VANCOTROUGH, VANCOPEAK, VANCORANDOM, GENTTROUGH, GENTPEAK, GENTRANDOM, TOBRATROUGH, TOBRAPEAK, TOBRARND, AMIKACINPEAK, AMIKACINTROU, AMIKACIN,  in the last 72 hours     Assessment: 27 YOM initially started on antibiotics for wound infection and possible HCAP. S/p multiple adjustments and currently on vancomycin, ciprofloxacin and metronidazole for questionable PICC line infection, peritonitis, and possible pleural infection.   Today is day 24 of antibiotics and per ID patient to continue on the current antibiotic regimen for 4 more days. Stop dates have been established.    /15 Vanc >> 6/20, resumed 6/21 >> 6/26, resumed 7/2 >>  6/15 Clinda>> 6/15  6/15 Cefepime>> 6/15  6/15 zosyn>>6/21  6/21 primaxin >>7/1  6/20 micafungin>>6/29  7/3 Cipro >>  7/3 Flagyl >>  6/18 VT = 5.5 on 1gm q12h, SCr 0.98  7/5 VT = 20.2 on 1gm q12h, SCr 2.09  6/17 abscess>> neg  6/15 urine>>neg final  6/15 blood x2 - CoNS in 1/2  6/15 resp>>neg final.  6/12 Urine>>Neg final.  6/21 urine>> neg  7/1 pleural fluid>>NG - final  7/1 blood x 2 >> NG - final  7/2 blood x 2 >> NG - final  Goal of Therapy:  Vancomycin trough level 15-20 mcg/ml  Plan:  -No dosing changes  needed -Vancomycin, flagyl and cipro have been reordered per MD with stop dates  Harland German, Ilda Basset D 02/12/2013 6:29 PM

## 2013-02-12 NOTE — Progress Notes (Signed)
Rehab admissions - I have approval for acute inpatient rehab admission.  Bed available and will admit to acute inpatient rehab today.  Call me for questions.  #[phone] 
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
## 2013-02-12 NOTE — Discharge Summary (Signed)
Tre Sanker, MD, MPH, FACS Pager: [phone]  

## 2013-02-12 NOTE — Progress Notes (Signed)
Will see if CIR is an option.  Improved clinically.  ID input appreciated. Patient examined and I agree with the assessment and plan  Violeta Gelinas, MD, MPH, FACS Pager: [phone]  02/12/2013 10:35 AM

## 2013-02-12 NOTE — Progress Notes (Signed)
Patient received at 1530 via wheelchair from [phone]. Patient alert and oriented x 4 no complaint of pain . Oriented patient to room and call bell system . Patient and mother verbalized understanding of admission process. Continue with plan of care.        Dakota Evans
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
## 2013-02-13 ENCOUNTER — Inpatient Hospital Stay (HOSPITAL_COMMUNITY): Payer: 59

## 2013-02-13 ENCOUNTER — Inpatient Hospital Stay (HOSPITAL_COMMUNITY): Payer: Self-pay | Admitting: Occupational Therapy

## 2013-02-13 ENCOUNTER — Inpatient Hospital Stay (HOSPITAL_COMMUNITY): Payer: 59 | Admitting: Speech Pathology

## 2013-02-13 DIAGNOSIS — S069XAA Unspecified intracranial injury with loss of consciousness status unknown, initial encounter: Secondary | ICD-10-CM

## 2013-02-13 DIAGNOSIS — S069X9A Unspecified intracranial injury with loss of consciousness of unspecified duration, initial encounter: Secondary | ICD-10-CM

## 2013-02-13 DIAGNOSIS — D72829 Elevated white blood cell count, unspecified: Secondary | ICD-10-CM

## 2013-02-13 DIAGNOSIS — J90 Pleural effusion, not elsewhere classified: Secondary | ICD-10-CM

## 2013-02-13 DIAGNOSIS — Z5189 Encounter for other specified aftercare: Secondary | ICD-10-CM

## 2013-02-13 LAB — CBC WITH DIFFERENTIAL/PLATELET
Eosinophils Relative: 2 % (ref 0–5)
HCT: 27.8 % — ABNORMAL LOW (ref 39.0–52.0)
Lymphocytes Relative: 10 % — ABNORMAL LOW (ref 12–46)
Lymphs Abs: 1.9 10*3/uL (ref 0.7–4.0)
MCV: 80.6 fL (ref 78.0–100.0)
Monocytes Relative: 8 % (ref 3–12)
Neutro Abs: 15.1 10*3/uL — ABNORMAL HIGH (ref 1.7–7.7)
Platelets: 542 10*3/uL — ABNORMAL HIGH (ref 150–400)
RBC: 3.45 MIL/uL — ABNORMAL LOW (ref 4.22–5.81)
WBC: 18.9 10*3/uL — ABNORMAL HIGH (ref 4.0–10.5)

## 2013-02-13 LAB — COMPREHENSIVE METABOLIC PANEL
AST: 19 U/L (ref 0–37)
Alkaline Phosphatase: 261 U/L — ABNORMAL HIGH (ref 39–117)
CO2: 20 mEq/L (ref 19–32)
Chloride: 99 mEq/L (ref 96–112)
Creatinine, Ser: 1.96 mg/dL — ABNORMAL HIGH (ref 0.50–1.35)
GFR calc non Af Amer: 46 mL/min — ABNORMAL LOW (ref >90)
Potassium: 3.8 mEq/L (ref 3.5–5.1)
Total Bilirubin: 0.5 mg/dL (ref 0.3–1.2)

## 2013-02-13 MED ORDER — SIMETHICONE 40 MG/0.6ML PO SUSP
80.0000 mg | Freq: Four times a day (QID) | ORAL | Status: DC
  Administered 2013-02-13 – 2013-02-20 (×28): 80 mg via ORAL
  Filled 2013-02-13 (×32): qty 1.2

## 2013-02-13 MED ORDER — HYDROCODONE-ACETAMINOPHEN 5-325 MG PO TABS
1.0000 | ORAL_TABLET | Freq: Once | ORAL | Status: AC
  Administered 2013-02-13: 1 via ORAL
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
## 2013-02-13 NOTE — Plan of Care (Signed)
Problem: Food- and Nutrition-Related Knowledge Deficit (NB-1.1) Goal: Nutrition education Formal process to instruct or train a patient/client in a skill or to impart knowledge to help patients/clients voluntarily manage or modify food choices and eating behavior to maintain or improve health. Outcome: Completed/Met Date Met:  02/13/13 Nutrition Education Note  RD consulted for nutrition education regarding Nutrition Management for New ileostomy.   RD provided both "Ileostomy, Colostomy, and Rectal pouch" handout. Reviewed home diet with pt and suggested ways to meet nutrition goals over the next several weeks. Explained reasons to follow Ileostomy diet.  Encouraged fluid intake. Family not available for education at time of visit.  Will continue to check for ongoing education needs as able.  Pt verbalizes understanding of information provided.  Expect good compliance.  Body mass index is 31.04 kg/(m^2). Pt meets criteria for obese class 1 based on current BMI.  Note pt has muscular build  Current diet order is Regular, patient is consuming approximately 50% of meals at this time. Labs and medications reviewed. No further nutrition interventions warranted at this time. RD contact information provided. If additional nutrition issues arise, please re-consult RD.  Loyce Dys, MS RD LDN Clinical Inpatient Dietitian Pager: [phone] Weekend/After hours pager: [phone]
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
## 2013-02-13 NOTE — Evaluation (Signed)
Reviewed and in agreement with treatment and assessment provided.  
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
## 2013-02-13 NOTE — Progress Notes (Signed)
Occupational Therapy Note  Patient Details  Name: Dakota Evans MRN: [id number] Date of Birth: [birth year] Today's Date: 02/13/2013  Time: 11:30-12pm ( ) Pt seen for 1:1 OT session with focus on transfers, balance and activity tolerance. Pt sitting in recliner upon arrival with pain of 6/10 in abdomen. Pt appeared fatigued, after having several evals this morning. Spoke to pt for several minutes regarding his situation and what he feels he needs to work on with pt getting emotional about situation. Gave pt a few minutes to compose himself before walking to ADL apartment. In kitchen, pt stood x to retrieve items from cabinets and out of refrigerator. Pt took 1 rest break during activity. In gym, pt stood to play checkers x 6-54min, needing to sit to rest and c/o pain in abdomen. Pt walked back to room. Left pt in recliner with call bell in place.    Shaliah Wann M Tesslyn Baumert 02/13/2013, 12:06 PM
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
## 2013-02-13 NOTE — Progress Notes (Signed)
Occupational Therapy Session Note  Patient Details  Name: Dakota Evans MRN: [id number] Date of Birth: [birth year]  Today's Date: 02/13/2013 Time: 1300-1350 Time Calculation (min): 50 min  Short Term Goals: Week 1:  OT Short Term Goal 1 (Week 1): LTG=STG  Skilled Therapeutic Interventions/Progress Updates:    1:1 focus on activity tolerance and endurance with functional mobility. Pt was asleep in recliner when arrived and reported he was very fatigued today from all the activity. Educated on reason why so fatigued and encouragement it will get better. Ambulated down to gym with HR remaining around 100s. Performed CTSIB on the [employer] Balance Master- scoring below [facility] with an increased in his sway index. (see shadow chart for further details). Also perform maze challenging his weight shifts and controlled movement. Pt with difficulty with small postural adjustments in a smooth pattern. Performed stair climbing for endurance in stairwell with steadying A but with increased HR to 130s requiring a prolonged rest break. Pt again c/o fatigue today. Returned to room where his grandmother, gf and baby girl were and returned to bed to rest. RN came and assisted with emptying ostomy.   Therapy Documentation Precautions:  Precautions Precautions: Fall Precaution Comments: colostomy and wound on abdomen Restrictions Weight Bearing Restrictions: No General: General Amount of Missed OT Time (min): 10 Minutes due to increased fatigue     Pain:  c/o abdominal pain but needed encouragement to take meds  See FIM for current functional status  Therapy/Group: Individual Therapy  Roney Mans [facility] 02/13/2013, 1:53 PM
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
## 2013-02-14 ENCOUNTER — Inpatient Hospital Stay (HOSPITAL_COMMUNITY): Payer: 59 | Admitting: Occupational Therapy

## 2013-02-14 ENCOUNTER — Inpatient Hospital Stay (HOSPITAL_COMMUNITY): Payer: 59 | Admitting: Speech Pathology

## 2013-02-14 ENCOUNTER — Inpatient Hospital Stay (HOSPITAL_COMMUNITY): Payer: 59 | Admitting: Physical Therapy

## 2013-02-14 MED ORDER — BIOTENE DRY MOUTH MT LIQD
15.0000 mL | Freq: Two times a day (BID) | OROMUCOSAL | Status: DC
  Administered 2013-02-14 – 2013-02-20 (×14): 15 mL via OROMUCOSAL

## 2013-02-14 NOTE — Progress Notes (Signed)
Recreational Therapy Session Note  Patient Details  Name: Dakota Evans MRN: [id number] Date of Birth: 17-Jun-1986 Today's Date: 02/14/2013 Time:  1130-12 Pain: 8/10 abdominal pain, PT made RN aware Skilled Therapeutic Interventions/Progress Updates: Session focused on activity tolerance, dynamic standing balance, divided attention, and language facilitation.  Pt stood for ball toss activity while discussing leisure interests with close supervision for balance.  Pt required min assist for balance during basketball activity while naming various chess pieces working on divided attention.  Pt required mod cues for naming task.  Pt stopped physical activity to focus on word finding/naming.  Pt ambulated on unit with close supervision-min assist.  Therapy/Group: Co-Treatment   Alessander Sikorski 02/14/2013, 4:06 PM
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
## 2013-02-14 NOTE — Progress Notes (Signed)
Occupational Therapy Session Note  Patient Details  Name: Dakota Evans MRN: [id number] Date of Birth: 1986-05-07  Today's Date: 02/14/2013 Time: 0800-0903 Time Calculation (min): 63 min  Short Term Goals: Week 1:  OT Short Term Goal 1 (Week 1): LTG=STG  Skilled Therapeutic Interventions/Progress Updates:    Pt worked on bathing at the sink in standing with min guard assist.  Pt performed only UB bathing and grooming tasks secondary to performing LB bathing last night.  Nursing came in to assist with changing abdominal wound and ileostomy bag, secondary to leaking.  Pt did demonstrate some intellectual awareness by agreeing his walking is not as good as it was and agreeing that he has trouble getting his words out.  When asked about his daughter he said she was 72 months old and when asked about her birth date he said [birth year] 2014.  Unable to state that this was not correct without mod re-direction.  Instructed pt to bring his LEs up and cross them when attempting to tie his shoes instead of bending down to the floor.  Noted that his HR increased to 135 through session while standing and mobilizing.    Therapy Documentation Precautions:  Precautions Precautions: Fall Precaution Comments: colostomy and wound on abdomen Restrictions Weight Bearing Restrictions: No  Vital Signs: Therapy Vitals Pulse Rate: 100 BP: 150/90 mmHg Pain: Pain Assessment Pain Assessment: 0-10 Pain Score: 5  Pain Type: Surgical pain Pain Location: Abdomen Pain Orientation: Mid Pain Frequency: Constant Pain Onset: On-going Patients Stated Pain Goal: 2 Pain Intervention(s): Medication (See eMAR) (vcodin 1 po)  See FIM for current functional status  Therapy/Group: Individual Therapy  Yoana Staib OTR/L 02/14/2013, 12:16 PM

## 2013-02-14 NOTE — Progress Notes (Addendum)
Occupational Therapy Session Note  Patient Details  Name: Dakota Evans MRN: [id number] Date of Birth: 12-Dec-1985  Today's Date: 02/14/2013 Time: 1300-1400 Time Calculation (min): 60 min  Session 2 Time: 15:05-15:35 Time Calculation: 30 mins  Short Term Goals: Week 1:  OT Short Term Goal 1 (Week 1): LTG=STG  Skilled Therapeutic Interventions/Progress Updates:    Session 1:  During session worked on dynamic standing balance while incorporating cognitive task of playing chess.  Pt able to recall all of the chess piece movements and play game without difficulty.  Only exhibited difficulty when therapist had him alternate his attention to a balance task and then come back to the chess game, he was unable to remember who's move it was.  With balance pt still demonstrating difficulty with dynamic standing balance.  During chess game had him standing on foam surface.  He was able to perform without any difficulty but did become fatigued after standing 10 mins and needed rest breaks.  LOB noted when standing on 1 leg at a time.  Pt was able to eventually stand for 10 seconds on each leg with 2-3 trials.    Session 2:  Pt worked on simple meal/snack prep in the kitchen this session.  Pt able to fix macaroni and cheese following the microwave directions with only min questioning cues.  Safely able to gather all supplies but did have trouble with word finding when therapist asked him about the measuring cup.  Pt demonstrated safety by testing the bowl for heat before taking out of the microwave using the pot holder.  He also cleaned up all of the trash and placed dirty utensils and dishes in the dishwasher when session was finished.  Pt performed toileting task after returning to the room with distant supervision.      Therapy Documentation Precautions:  Precautions Precautions: Fall Precaution Comments: colostomy and wound on abdomen Restrictions Weight Bearing Restrictions: No Pain: Pain  Assessment Pain Assessment: 0-10 Pain Score: 8  Pain Type: Surgical pain Pain Location: Abdomen Pain Orientation: Mid;Lower Pain Descriptors / Indicators: Aching;Discomfort;Sore;Tender Pain Frequency: Occasional Pain Onset: Gradual Patients Stated Pain Goal: 2 Pain Intervention(s): Medication (See eMAR) (vicodin 1 po)  See FIM for current functional status  Therapy/Group: Individual Therapy  Kirandeep Fariss OTR/L 02/14/2013, 2:51 PM

## 2013-02-14 NOTE — Progress Notes (Signed)
[facility] Individual Statement of Services  Patient Name:  Dakota Evans  Date:  02/14/2013  Welcome to the [facility].  Our goal is to provide you with an individualized program based on your diagnosis and situation, designed to meet your specific needs.  With this comprehensive rehabilitation program, you will be expected to participate in at least 3 hours of rehabilitation therapies Monday-Friday, with modified therapy programming on the weekends.  Your rehabilitation program will include the following services:  Physical Therapy (PT), Occupational Therapy (OT), Speech Therapy (ST), 24 hour per day rehabilitation nursing, Therapeutic Recreaction (TR), Neuropsychology, Case Management (Social Worker), Rehabilitation Medicine, Nutrition Services and Pharmacy Services  Weekly team conferences will be held on Tuesdays to discuss your progress.  Your Social Worker will talk with you frequently to get your input and to update you on team discussions.  Team conferences with you and your family in attendance may also be held.  Expected length of stay: 10 days  Overall anticipated outcome: modified independent  Depending on your progress and recovery, your program may change. Your Social Worker will coordinate services and will keep you informed of any changes. Your Social Worker's name and contact numbers are listed  below.  The following services may also be recommended but are not provided by the [facility]:   Driving Evaluations  Home Health Rehabiltiation Services  Outpatient Rehabilitatation [facility]  Vocational Rehabilitation   Arrangements will be made to provide these services after discharge if needed.  Arrangements include referral to agencies that provide these services.  Your insurance has been verified to be:  [place] Your primary doctor is:  None  Pertinent information will be shared with your doctor and your  insurance company.  Social Worker:  [place], [place] [phone] or ([phone]  Information discussed with and copy given to patient by: Amada Jupiter, 02/14/2013, 2:38 PM
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
## 2013-02-14 NOTE — Progress Notes (Signed)
Patient information reviewed and entered into eRehab system by Telsa Dillavou, RN, CRRN, PPS Coordinator.  Information including medical coding and functional independence measure will be reviewed and updated through discharge.    
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
## 2013-02-14 NOTE — Progress Notes (Signed)
Physical Therapy Session Note  Patient Details  Name: Dakota Evans MRN: [id number] Date of Birth: 06-May-1986  Today's Date: 02/14/2013 Time: 1100-1200 Time Calculation (min): 60 min  Short Term Goals: Week 1:  PT Short Term Goal 1 (Week 1): =LTG  Skilled Therapeutic Interventions/Progress Updates:    Pt received sitting in recliner.  Pt performed gait to and from bathroom with supervision to min assist for safety.  Gait training in with increasing and decreasing gait speed, stop and go, head movements with pt displaying LOB with head movements L with  Partial self recovery 25% assist from therapist.  Static and dynamic balance activities with varying BOS and level and uneven surfaces with cognitive activities with pt able to tolerate approximately 3-5 mins and then requested seated rest, slight confusion noted when given more than 2 tasks to complete with slight increase in rapid visual scanning.  B LEs strengthening in parallel bars with pt c/o fatigue and requiring seated rest.  Pt pleasant and motivated.  Therapy Documentation Precautions:  Precautions Precautions: Fall Precaution Comments: colostomy and wound on abdomen Restrictions Weight Bearing Restrictions: No       Pain: Pain Assessment Pain Assessment: 0-10 Pain Score: 8  Pain Type: Surgical pain Pain Location: Abdomen Pain Orientation: Mid;Lower Pain Descriptors / Indicators: Aching;Discomfort;Sore;Tender Pain Frequency: Occasional Pain Onset: Gradual Patients Stated Pain Goal: 2 Pain Intervention(s): Medication (See eMAR) (vicodin 1 po)    Locomotion : Ambulation Ambulation/Gait Assistance: 4: Min guard                See FIM for current functional status  Therapy/Group: Individual Therapy/ Co treatment for 30 mins with Recreational therapy  Jackelyn Knife 02/14/2013, 4:28 PM

## 2013-02-14 NOTE — Progress Notes (Signed)
Speech Language Pathology Daily Session Note  Patient Details  Name: Dakota Evans MRN: [id number] Date of Birth: 1985-09-28  Today's Date: 02/14/2013 Time: 1400-1500 Time Calculation (min): 60 min  Short Term Goals: Week 1: SLP Short Term Goal 1 (Week 1): Pt will demonstrate functional problem solving for basic and familiar tasks with supervision verbal cues.  SLP Short Term Goal 2 (Week 1): Pt will utilize external memory aids to recall new, daily information with supervision verbal anfd question cues.  SLP Short Term Goal 3 (Week 1): Pt will verbally express basic wants/needs with min phonemic and semantic cues. SLP Short Term Goal 4 (Week 1): Pt will self-monitor and correct verbal errors at the phrase level with Min semantic and phonemic cues.  SLP Short Term Goal 5 (Week 1): Pt will follow 2 step commands with Mod A verbal cues.   Skilled Therapeutic Interventions: Treatment focus on verbal expression. SLP facilitated session by providing Mod A phonemic, semantic and visual cues to correct verbal errors which are characterized by phonemic paraphasias at the phrase level with a picture description task. Pt also required Mod A phonemic and semantic cues to identify functional items.    FIM:  Comprehension Comprehension Mode: Auditory Comprehension: 4-Understands basic 75 - 89% of the time/requires cueing 10 - 24% of the time Expression Expression: 3-Expresses basic 50 - 74% of the time/requires cueing 25 - 50% of the time. Needs to repeat parts of sentences. Social Interaction Social Interaction: 4-Interacts appropriately 75 - 89% of the time - Needs redirection for appropriate language or to initiate interaction. Problem Solving Problem Solving: 5-Solves basic 90% of the time/requires cueing < 10% of the time Memory Memory: 5-Recognizes or recalls 90% of the time/requires cueing < 10% of the time  Pain Pain Assessment Pain Assessment: 0-10 Pain Score: 4  Pain Type: Surgical  pain Pain Location: Abdomen Pain Orientation: Mid;Lower Pain Descriptors / Indicators: Aching;Sore Pain Frequency: Occasional Pain Onset: Gradual Patients Stated Pain Goal: 2 Pain Intervention(s): Medication (See eMAR) (vicodin 2 po)  Therapy/Group: Individual Therapy  Rechelle Niebla 02/14/2013, 5:23 PM

## 2013-02-15 ENCOUNTER — Inpatient Hospital Stay (HOSPITAL_COMMUNITY): Payer: Self-pay | Admitting: Occupational Therapy

## 2013-02-15 ENCOUNTER — Encounter (HOSPITAL_COMMUNITY): Payer: Self-pay | Admitting: Occupational Therapy

## 2013-02-15 ENCOUNTER — Inpatient Hospital Stay (HOSPITAL_COMMUNITY): Payer: 59

## 2013-02-15 ENCOUNTER — Inpatient Hospital Stay (HOSPITAL_COMMUNITY): Payer: 59 | Admitting: *Deleted

## 2013-02-15 ENCOUNTER — Inpatient Hospital Stay (HOSPITAL_COMMUNITY): Payer: 59 | Admitting: Speech Pathology

## 2013-02-15 DIAGNOSIS — S069XAA Unspecified intracranial injury with loss of consciousness status unknown, initial encounter: Secondary | ICD-10-CM

## 2013-02-15 DIAGNOSIS — S06309A Unspecified focal traumatic brain injury with loss of consciousness of unspecified duration, initial encounter: Secondary | ICD-10-CM

## 2013-02-15 DIAGNOSIS — S069X9A Unspecified intracranial injury with loss of consciousness of unspecified duration, initial encounter: Secondary | ICD-10-CM

## 2013-02-15 DIAGNOSIS — S0630AA Unspecified focal traumatic brain injury with loss of consciousness status unknown, initial encounter: Secondary | ICD-10-CM
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
## 2013-02-15 NOTE — Progress Notes (Signed)
Occupational Therapy Session Note  Patient Details  Name: Dakota Evans MRN: [id number] Date of Birth: 11-Oct-1985  Today's Date: 02/15/2013 Time: 1355-1450 Time Calculation (min): 55 min  Short Term Goals: Week 1:  OT Short Term Goal 1 (Week 1): LTG=STG  Skilled Therapeutic Interventions/Progress Updates:    Therapy session focused on functional ambulation in community setting, dynamic standing balance, and activity tolerance. Pt navigated from room to outside with verbal cues to look at signs to reach destination. Went outside and to solarium where pt reported enjoying both a lot. Engaged in conversation about hobbies and pt demonstrated expressive deficits particularly with word finding. Pt with good emergent awareness when ambulating through hallways as he would move to side when someone was walking by. Practiced ambulating in community setting with increased speed and with head turns. Returned to therapy gym and engaged in dynamic standing balance of bouncing ball off target and catching. Pt with good reaction time and no lob during activity even after taking several steps back. Engaged in activity with pt stepping to toss ball and with feet stationary. Engaged in dynamic activity of bouncing ball while ambulating on straight line to simulate balance. Pt with mild difficulty during this task. Engaged in ball tossing activity while walking down hallway to challenge balance. Pt with lob to R side on 2 occasions and fatigued much quicker with this activity. Pt had difficulty walking in straight line when ambulation was challenged with head turns. Pt required frequent rest breaks throughout therapy session secondary to fatigue.   Therapy Documentation Precautions:  Precautions Precautions: Fall Precaution Comments: colostomy and wound on abdomen Restrictions Weight Bearing Restrictions: No General:   Vital Signs: Therapy Vitals Temp: 97.5 F (36.4 C) Temp src: Oral Pulse Rate:  95 Resp: 18 BP: 122/85 mmHg Patient Position, if appropriate: Sitting Oxygen Therapy SpO2: 99 % Pain: 5/10 in abdomen   See FIM for current functional status  Therapy/Group: Individual Therapy  Daneil Dan 02/15/2013, 3:09 PM

## 2013-02-15 NOTE — Progress Notes (Signed)
Physical Therapy Session Note  Patient Details  Name: Llewelyn Sheaffer MRN: [id number] Date of Birth: [birth year]  Today's Date: 02/15/2013 Time: 1000-1100 Time Calculation (min): 60 min  Short Term Goals: Week 1:  PT Short Term Goal 1 (Week 1): =LTG  Skilled Therapeutic Interventions/Progress Updates:    Pt reports 8/10 pain score in abdomen increased with movement. Nsg notified of pt pain.  Pt performed gait training with varying speeds, stop and go, head turns, pt continues with LOB with head turns to the L side.  Navigated 3 flights of stairs up and down with rest required between each flight, increased respiration while going down with pt reporting fatigue.  Pt performed dynamic standing balance activities with varying BOS and eyes open and eyes closed, increased LOB and c/o dizziness with eyes closed and narrow BOS.  Performed cognitive training for sequencing of colors and numbers with stepping to improve sustained attention during functional mobility. Pt displayed increased confusion and difficulty with focus with distractions, moved to quiet area and pt improved with performing recall.  Pt very motivated.   Therapy Documentation Precautions:  Precautions Precautions: Fall Precaution Comments: colostomy and wound on abdomen Restrictions Weight Bearing Restrictions: No       Pain: Pt reports 8/10 painscore in abdomen with pain behaviors of grimacing with movement.      Locomotion : Ambulation Ambulation/Gait Assistance: 4: Min guard                See FIM for current functional status  Therapy/Group: Co-Treatment with Recreational Therapy  And individual treatment  Jackelyn Knife 02/15/2013, 11:55 AM

## 2013-02-15 NOTE — Progress Notes (Signed)
Speech Language Pathology Daily Session Note  Patient Details  Name: Dakota Evans MRN: [id number] Date of Birth: 05/03/1986  Today's Date: 02/15/2013 Time: 1115-1200 Time Calculation (min): 45 min  Short Term Goals: Week 1: SLP Short Term Goal 1 (Week 1): Pt will demonstrate functional problem solving for basic and familiar tasks with supervision verbal cues.  SLP Short Term Goal 2 (Week 1): Pt will utilize external memory aids to recall new, daily information with supervision verbal anfd question cues.  SLP Short Term Goal 3 (Week 1): Pt will verbally express basic wants/needs with min phonemic and semantic cues. SLP Short Term Goal 4 (Week 1): Pt will self-monitor and correct verbal errors at the phrase level with Min semantic and phonemic cues.  SLP Short Term Goal 5 (Week 1): Pt will follow 2 step commands with Mod A verbal cues.   Skilled Therapeutic Interventions: Treatment focus on verbal expression. Pt participated in description and generative sentence task and could independently self-monitor his errors but required Mod A phonemic and semantic cues to self-correct. Pt's reading comprehension was [facility] at the word level. Pt also participated in a functional, informal conversation about animals and was overall Mod I for verbal expression. Pt demonstrated emergent awareness in regards to the difference in his verbal expression in structured tasks vs. informal conversation.    FIM:  Comprehension Comprehension Mode: Auditory Comprehension: 4-Understands basic 75 - 89% of the time/requires cueing 10 - 24% of the time Expression Expression Mode: Verbal Expression: 3-Expresses basic 50 - 74% of the time/requires cueing 25 - 50% of the time. Needs to repeat parts of sentences. Social Interaction Social Interaction: 4-Interacts appropriately 75 - 89% of the time - Needs redirection for appropriate language or to initiate interaction. Problem Solving Problem Solving: 5-Solves basic 90%  of the time/requires cueing < 10% of the time Memory Memory: 5-Recognizes or recalls 90% of the time/requires cueing < 10% of the time  Pain Reported pain in abdomin, pt repositioned and RN notified.   Therapy/Group: Individual Therapy  Jeyden Coffelt 02/15/2013, 2:24 PM
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
## 2013-02-15 NOTE — Progress Notes (Signed)
Recreational Therapy Session Note  Patient Details  Name: Dakota Evans MRN: [id number] Date of Birth: 1986/01/15 Today's Date: 02/15/2013 Time:  1040-11 Pain: c/o abdominal pain with movement, premedicated, frequent rest breaks assisted with relief Skilled Therapeutic Interventions/Progress Updates: Pt participated in modified "simon" game working on dynamic standing balance, STM remembering a sequence of 6 with mod cues, & selective attention.  Pt required supervision for balance.  Pt recognized increased confusion during activity when environment became more distracting.  Relocated to a less busy area in which pt was better able to perform tasks.    Therapy/Group: Co-Treatment   Sabella Traore 02/15/2013, 1:24 PM

## 2013-02-16 ENCOUNTER — Inpatient Hospital Stay (HOSPITAL_COMMUNITY): Payer: Self-pay

## 2013-02-16 ENCOUNTER — Inpatient Hospital Stay (HOSPITAL_COMMUNITY): Payer: 59 | Admitting: Physical Therapy

## 2013-02-16 ENCOUNTER — Inpatient Hospital Stay (HOSPITAL_COMMUNITY): Payer: 59

## 2013-02-16 ENCOUNTER — Inpatient Hospital Stay (HOSPITAL_COMMUNITY): Payer: 59 | Admitting: Speech Pathology

## 2013-02-16 DIAGNOSIS — R4701 Aphasia: Secondary | ICD-10-CM

## 2013-02-16 DIAGNOSIS — N170 Acute kidney failure with tubular necrosis: Secondary | ICD-10-CM

## 2013-02-16 NOTE — Progress Notes (Signed)
Occupational Therapy Session Note  Patient Details  Name: Quintan Saldivar MRN: [id number] Date of Birth: [birth year]  Today's Date: 02/16/2013 Time: 6295-2841 Time Calculation (min): 40 min  Short Term Goals: Week 1:  OT Short Term Goal 1 (Week 1): LTG=STG  Skilled Therapeutic Interventions: ADL-retraining, sitting and standing at sink, using standard chair and basin, with emphasis on sequencing, safety and dynamic sitting/standing balance.   Patient demonstrates proficiency with bathing and dressing task, with distant supervision.   No evidence of balance, endurance, or cognitive deficits noted during this session although trunk flexion is limited by pain and is complicated by management of colostomy.   Therapy Documentation Precautions:  Precautions Precautions: Fall Precaution Comments: colostomy and wound on abdomen Restrictions Weight Bearing Restrictions: No  Pain: Pain Assessment Pain Assessment: No/denies pain  See FIM for current functional status  Therapy/Group: Individual Therapy  Second session: Time:1430-1515 Time Calculation (min):  45 min  Pain Assessment: 5/10, abdominal pain  Skilled Therapeutic Interventions: Therapeutic activities with emphasis on dynamic standing balance, functional mobility, endurance, weight-shifting, and verbal expression/communicaiton.  Patient participated in a initial activity intended to simulate work-related skills (bending, reaching, manipulating objects) but activity was terminated early due to environmental distractions which inhibited performance.  Patient completed functional mobility, walking a straight line, heel to toe, approx 20 feet, challenged with suspending in his foot in air for 1-2 seconds just prior to placing it down.  Patient responded well to challenge but was unable to perform task consecutively (right foot then left foot) more than 3 times without LOB.    Patient completed Wii balance games for 15 minutes with  supervision and good participation and weight-shifting during soccer ball challenge game, achieving "amateur" status (57 hits).  During verbal discussions, patient attempted self-corrections but requires cues to repeat for improved clarity.  See FIM for current functional status  Therapy/Group: Individual Therapy  Georgeanne Nim 02/16/2013, 10:55 AM

## 2013-02-16 NOTE — Progress Notes (Signed)
Speech Language Pathology Daily Session Note  Patient Details  Name: Dakota Evans MRN: [id number] Date of Birth: 1986-07-06  Today's Date: 02/16/2013 Time: 4696-2952 Time Calculation (min): 45 min  Short Term Goals: Week 1: SLP Short Term Goal 1 (Week 1): Pt will demonstrate functional problem solving for basic and familiar tasks with supervision verbal cues.  SLP Short Term Goal 2 (Week 1): Pt will utilize external memory aids to recall new, daily information with supervision verbal anfd question cues.  SLP Short Term Goal 3 (Week 1): Pt will verbally express basic wants/needs with min phonemic and semantic cues. SLP Short Term Goal 4 (Week 1): Pt will self-monitor and correct verbal errors at the phrase level with Min semantic and phonemic cues.  SLP Short Term Goal 5 (Week 1): Pt will follow 2 step commands with Mod A verbal cues.   Skilled Therapeutic Interventions: Treatment focus on cognitive-linguistic goals. SLP facilitated session by providing Mod A semantic cues for generative naming task within a specific category. Pt was Mod I for self-monitoring of errors but required Mod A verbal cues to self-correct.  Pt also participated in written expression task at the word level and required Min A question and verbal cues to self-monitor and correct errors.    FIM:  Comprehension Comprehension Mode: Auditory Comprehension: 4-Understands basic 75 - 89% of the time/requires cueing 10 - 24% of the time Expression Expression Mode: Verbal Expression: 3-Expresses basic 50 - 74% of the time/requires cueing 25 - 50% of the time. Needs to repeat parts of sentences. Social Interaction Social Interaction: 4-Interacts appropriately 75 - 89% of the time - Needs redirection for appropriate language or to initiate interaction. Problem Solving Problem Solving: 4-Solves basic 75 - 89% of the time/requires cueing 10 - 24% of the time Memory Memory: 4-Recognizes or recalls 75 - 89% of the  time/requires cueing 10 - 24% of the time  Pain Pain Assessment Pain Assessment: No/denies pain  Therapy/Group: Individual Therapy  Consuelo Thayne 02/16/2013, 9:21 AM

## 2013-02-16 NOTE — Progress Notes (Signed)
Dakota Evans is a 27 y.o. male [birth year] [id number]  Subjective: No new complaints. No new problems. Slept well. Feeling OK.  Objective: Vital signs in last 24 hours: Temp:  [97.5 F (36.4 C)-99.1 F (37.3 C)] 99.1 F (37.3 C) (07/12 0607) Pulse Rate:  [91-95] 91 (07/12 0607) Resp:  [18] 18 (07/12 0607) BP: (122)/(70-85) 122/70 mmHg (07/12 0607) SpO2:  [99 %-100 %] 100 % (07/12 7829) Weight change:  Last BM Date: 02/16/13  Intake/Output from previous day: 07/11 0701 - 07/12 0700 In: 600 [P.O.:600] Out: 2000 [Urine:900; Stool:1100] Last cbgs: CBG (last 3)  No results found for this basename: GLUCAP,  in the last 72 hours   Physical Exam General: No apparent distress    HEENT: moist mucosa Lungs: Normal effort. Lungs clear to auscultation, no crackles or wheezes. Cardiovascular: Regular rate and rhythm, no edema Musculoskeletal:  No change from before Neurological: No new neurological deficits Wounds: N/A   Colostomy Skin: clear Alert, cooperative, aphasic   Lab Results: BMET    Component Value Date/Time   NA 133* 02/13/2013 0550   K 3.8 02/13/2013 0550   CL 99 02/13/2013 0550   CO2 20 02/13/2013 0550   GLUCOSE 111* 02/13/2013 0550   BUN 15 02/13/2013 0550   CREATININE 1.96* 02/13/2013 0550   CALCIUM 9.5 02/13/2013 0550   GFRNONAA 46* 02/13/2013 0550   GFRAA 53* 02/13/2013 0550   CBC    Component Value Date/Time   WBC 18.9* 02/13/2013 0550   RBC 3.45* 02/13/2013 0550   HGB 9.6* 02/13/2013 0550   HCT 27.8* 02/13/2013 0550   PLT 542* 02/13/2013 0550   MCV 80.6 02/13/2013 0550   MCH 27.8 02/13/2013 0550   MCHC 34.5 02/13/2013 0550   RDW 14.2 02/13/2013 0550   LYMPHSABS 1.9 02/13/2013 0550   MONOABS 1.5* 02/13/2013 0550   EOSABS 0.4 02/13/2013 0550   BASOSABS 0.0 02/13/2013 0550    Studies/Results: No results found.  Medications: I have reviewed the patient's current medications.  Assessment/Plan:   1. DVT Prophylaxis/Anticoagulation: Pharmaceutical: Lovenox  2. Pain Management:  N/A  3. Mood: will have LCSW follow for evaluation. No signs of distress and seems to be upbeat.  4. Neuropsych: This patient is capable of making decisions on his own behalf.  5. FUO resolved: Will monitor white count and fever curve.afebrile . Will discontinue and use prn. If patient spikes temp again will CT abdomen, pelvis and chest 6. Renal insufficiency: Due to multiple medical issues--on Vancomycin Pharmacy to adjust dosing to help maintain therapeutic levels and avoid toxicity/renal status. Routine check lytes. 7. Recurrent Large left pleural effusion: Will monitor for symptoms. Tap if fever recurs or symptomatic.  8. Leucocytosis: will monitor with routine checks. Stable value, a febrile  9. Hyponatremia: Stable overall. Monitor fluid balance with strict I and O as well as ileostomy output. Encourage po intake.  10. Tachycardia: due to deconditioning. Continue low dose BB.     Length of stay, days: 4  Sonda Primes , MD 02/16/2013, 9:15 AM

## 2013-02-16 NOTE — Progress Notes (Signed)
Physical Therapy Session Note  Patient Details  Name: Dakota Evans MRN: [id number] Date of Birth: [birth year]  Today's Date: 02/16/2013 Time: 1351-1430 Time Calculation (min): 39 min  Short Term Goals: Week 1:  PT Short Term Goal 1 (Week 1): =LTG  Skilled Therapeutic Interventions/Progress Updates:    Pt was seen bedside in the pm, sleeping in bedside chair. Pt willing to participate with therapy. Pt able to maneuver around room, in/out of bathroom with S, no LOB noted. Pt participated with multiple high level balance activities including single leg stance activities to increase balance and independence with mobility. Pt occasionally became distracted requiring vc to remain focused on task.   Therapy Documentation Precautions:  Precautions Precautions: Fall Precaution Comments: colostomy and wound on abdomen Restrictions Weight Bearing Restrictions: No Pain: No c/o pain  Locomotion : Ambulation Ambulation/Gait Assistance: 5: Supervision   See FIM for current functional status  Therapy/Group: Individual Therapy  Rayford Halsted 02/16/2013, 3:30 PM

## 2013-02-17 ENCOUNTER — Inpatient Hospital Stay (HOSPITAL_COMMUNITY): Payer: 59 | Admitting: Physical Therapy

## 2013-02-17 DIAGNOSIS — S069X9S Unspecified intracranial injury with loss of consciousness of unspecified duration, sequela: Secondary | ICD-10-CM

## 2013-02-17 DIAGNOSIS — K631 Perforation of intestine (nontraumatic): Secondary | ICD-10-CM

## 2013-02-17 DIAGNOSIS — S069XAS Unspecified intracranial injury with loss of consciousness status unknown, sequela: Secondary | ICD-10-CM

## 2013-02-17 DIAGNOSIS — D62 Acute posthemorrhagic anemia: Secondary | ICD-10-CM

## 2013-02-17 MED ORDER — HYDROCERIN EX CREA
TOPICAL_CREAM | Freq: Every day | CUTANEOUS | Status: DC
  Administered 2013-02-17 – 2013-02-18 (×2): via TOPICAL
  Filled 2013-02-17: qty 113

## 2013-02-17 NOTE — Progress Notes (Signed)
Physical Therapy Session Note  Patient Details  Name: Dakota Evans MRN: [id number] Date of Birth: 1985/11/11  Today's Date: 02/17/2013 Time: 3086-5784 Time Calculation (min): 45 min  Short Term Goals: Week 1:  PT Short Term Goal 1 (Week 1): =LTG  Skilled Therapeutic Interventions/Progress Updates:    Pt was seen bedside in the am, supervision for bed mobility, transfers and ambulation. In gym performed high level balance activities and strengthening exercises involving single stance and weights to increase strength and balance.   Therapy Documentation Precautions:  Precautions Precautions: Fall Precaution Comments: colostomy and wound on abdomen Restrictions Weight Bearing Restrictions: No  Pain: No c/o pain  Locomotion : Ambulation Ambulation/Gait Assistance: 5: Supervision   See FIM for current functional status  Therapy/Group: Individual Therapy  Rayford Halsted 02/17/2013, 12:38 PM

## 2013-02-17 NOTE — Plan of Care (Signed)
Problem: RH BOWEL ELIMINATION Goal: RH STG MANAGE BOWEL WITH ASSISTANCE STG Manage Bowel with mod I.  Outcome: Progressing Performed ostomy care independently without cueing from staff this evening.   Goal: RH STG MANAGE BOWEL W/EQUIPMENT W/ASSISTANCE STG Manage Bowel With Equipment With Assistance  Outcome: Progressing Performed ostomy care this evening without cueing from staff.   Problem: RH PAIN MANAGEMENT Goal: RH STG PAIN MANAGED AT OR BELOW PT'S PAIN GOAL <3  Outcome: Not Progressing Continues to require medications for pain management associated with abdominal incision.  Area remains sore/tender. Pain medications effective.

## 2013-02-17 NOTE — Plan of Care (Signed)
Problem: RH BOWEL ELIMINATION Goal: RH STG MANAGE BOWEL W/EQUIPMENT W/ASSISTANCE STG Manage Bowel With Equipment With Assistance  Outcome: Progressing Pt able to empty ostomy bag with staff guidance.
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
## 2013-02-17 NOTE — Progress Notes (Signed)
Dakota Evans is a 27 y.o. male 1985-09-10 [id number]  Subjective: No new complaints. No new problems. Slept well. Feeling OK.  Objective: Vital signs in last 24 hours: Temp:  [98.2 F (36.8 C)-98.4 F (36.9 C)] 98.2 F (36.8 C) (07/13 0559) Pulse Rate:  [85-104] 85 (07/13 0559) Resp:  [18-19] 19 (07/13 0559) BP: (113-132)/(63-81) 113/63 mmHg (07/13 0559) SpO2:  [100 %] 100 % (07/13 0559) Weight change:  Last BM Date: 02/16/13  Intake/Output from previous day: 07/12 0701 - 07/13 0700 In: 680 [P.O.:680] Out: 2650 [Urine:1700; Stool:950] Last cbgs: CBG (last 3)  No results found for this basename: GLUCAP,  in the last 72 hours   Physical Exam General: No apparent distress    HEENT: moist mucosa Lungs: Normal effort. Lungs clear to auscultation, no crackles or wheezes. Cardiovascular: Regular rate and rhythm, no edema Musculoskeletal:  No change from before Neurological: No new neurological deficits Wounds: N/A   Colostomy Skin: clear Alert, cooperative, aphasic   Lab Results: BMET    Component Value Date/Time   NA 133* 02/13/2013 0550   K 3.8 02/13/2013 0550   CL 99 02/13/2013 0550   CO2 20 02/13/2013 0550   GLUCOSE 111* 02/13/2013 0550   BUN 15 02/13/2013 0550   CREATININE 1.96* 02/13/2013 0550   CALCIUM 9.5 02/13/2013 0550   GFRNONAA 46* 02/13/2013 0550   GFRAA 53* 02/13/2013 0550   CBC    Component Value Date/Time   WBC 18.9* 02/13/2013 0550   RBC 3.45* 02/13/2013 0550   HGB 9.6* 02/13/2013 0550   HCT 27.8* 02/13/2013 0550   PLT 542* 02/13/2013 0550   MCV 80.6 02/13/2013 0550   MCH 27.8 02/13/2013 0550   MCHC 34.5 02/13/2013 0550   RDW 14.2 02/13/2013 0550   LYMPHSABS 1.9 02/13/2013 0550   MONOABS 1.5* 02/13/2013 0550   EOSABS 0.4 02/13/2013 0550   BASOSABS 0.0 02/13/2013 0550    Studies/Results: No results found.  Medications: I have reviewed the patient's current medications.  Assessment/Plan:   1. DVT Prophylaxis/Anticoagulation: Pharmaceutical: Lovenox  2. Pain Management:  N/A  3. Mood: will have LCSW follow for evaluation. No signs of distress and seems to be upbeat.  4. Neuropsych: This patient is capable of making decisions on his own behalf.  5. FUO resolved: Will monitor white count and fever curve.afebrile . Will discontinue and use prn. If patient spikes temp again will CT abdomen, pelvis and chest 6. Renal insufficiency: Due to multiple medical issues--on Vancomycin Pharmacy to adjust dosing to help maintain therapeutic levels and avoid toxicity/renal status. Routine check lytes. 7. Recurrent Large left pleural effusion: Will monitor for symptoms. Tap if fever recurs or symptomatic.  8. Leucocytosis: will monitor with routine checks. Stable value, a febrile  9. Hyponatremia: Stable overall. Monitor fluid balance with strict I and O as well as ileostomy output. Encourage po intake.  10. Tachycardia: due to deconditioning. Continue low dose BB.  Cont Rx     Length of stay, days: 5  Sonda Primes , MD 02/17/2013, 8:49 AM

## 2013-02-17 NOTE — Progress Notes (Signed)
Patient ambulated to bathroom to void independently with this nurse in the room.  Gait was steady.  No assistive device used.  Patient remembered to place his shoes on his feet before ambulating.  Patient then voided without difficulty into bathroom toilet independently.  Patient then stated that he wanted to perform his own ostomy care.  This nurse stood in bathroom in case he needed assistance.  Patient performed 100% of his ostomy care independently without cueing from staff.  Emptied the ostomy bag and irrigated bag with water he obtained in graduate.  Applied clamp without assistance.  All care performed in bathroom with patient on his knees in front of the toilet. Patient got onto his knees and off his knees without assistance from staff.  Washed hands after ostomy care completed without cueing.  Patient stated that he feels confident in doing own care.    Kelli Hope M

## 2013-02-17 NOTE — Progress Notes (Signed)
Pt expressed interest in learning to care for his ostomy. He understands he is going home with it and needs to learn before he leaves. Assisted pt to bathroom so he could empty the bag. Pt positioned self over toilet, unclamped bag, and emptied stool into toilet without cueing. Provided instruction to pt on how to irrigate the bag and replace the clamp. He stated that he wants staff to let him empty the bag as much as possible instead of staff performing the task. Explained to pt that WOC nurse would be instructing him on how to perform entire bag change. He stated "if you all are ready, I'm ready". Continue with plan of care. Mick Sell RN

## 2013-02-17 NOTE — Plan of Care (Signed)
Problem: RH PAIN MANAGEMENT Goal: RH STG PAIN MANAGED AT OR BELOW PT'S PAIN GOAL <3  Outcome: Progressing PRN vicodin effective, majority of pain associated with dressing change to abdomen.

## 2013-02-18 ENCOUNTER — Inpatient Hospital Stay (HOSPITAL_COMMUNITY): Payer: 59 | Admitting: Physical Therapy

## 2013-02-18 ENCOUNTER — Encounter (HOSPITAL_COMMUNITY): Payer: Self-pay | Admitting: Occupational Therapy

## 2013-02-18 ENCOUNTER — Inpatient Hospital Stay (HOSPITAL_COMMUNITY): Payer: 59 | Admitting: Speech Pathology

## 2013-02-18 ENCOUNTER — Ambulatory Visit (HOSPITAL_COMMUNITY): Payer: Self-pay

## 2013-02-18 ENCOUNTER — Inpatient Hospital Stay (HOSPITAL_COMMUNITY): Payer: 59 | Admitting: Occupational Therapy

## 2013-02-18 DIAGNOSIS — S069XAA Unspecified intracranial injury with loss of consciousness status unknown, initial encounter: Secondary | ICD-10-CM

## 2013-02-18 DIAGNOSIS — S069X9A Unspecified intracranial injury with loss of consciousness of unspecified duration, initial encounter: Secondary | ICD-10-CM

## 2013-02-18 NOTE — Progress Notes (Signed)
Physical Therapy Note  Patient Details  Name: Dakota Evans MRN: [id number] Date of Birth: 13-Oct-1985 Today's Date: 02/18/2013  1000-1055 (55 minutes) individual Pain: no complaint of pain Focus of treatment:Therapeutic activities focused on activity tolerance; divided attention Treatment: Gait in room now independent; SBA on unit > 500 feet; Nustep Level 5 X 10 minutes ( Heart rate 104-110) with perceived exertion 11 (light); pt able to use pipe tree duplication activity with only minimal cues; pt able to use computer to search for his vehicle and additional vehicle of his choice without cues.    1525- 1610 (45 minutes) individual Pain: no reported pain Focus of treatment: Therapeutic exercise focused on activity tolerance; gait training (steps)/ activity tolerance Treatment: Up/down 2 flights of steps step over step SBA; Kinetron in standing without UE support  3 X 30 reps (no loss of balance).   [place] Souter,JIM 02/18/2013, 10:15 AM

## 2013-02-18 NOTE — Progress Notes (Signed)
Speech Language Pathology Daily Session Note  Patient Details  Name: Dakota Evans MRN: [id number] Date of Birth: 07-13-1986  Today's Date: 02/18/2013 Time: 8295-6213 Time Calculation (min): 45 min  Short Term Goals: Week 1: SLP Short Term Goal 1 (Week 1): Pt will demonstrate functional problem solving for basic and familiar tasks with supervision verbal cues.  SLP Short Term Goal 2 (Week 1): Pt will utilize external memory aids to recall new, daily information with supervision verbal anfd question cues.  SLP Short Term Goal 3 (Week 1): Pt will verbally express basic wants/needs with min phonemic and semantic cues. SLP Short Term Goal 4 (Week 1): Pt will self-monitor and correct verbal errors at the phrase level with Min semantic and phonemic cues.  SLP Short Term Goal 5 (Week 1): Pt will follow 2 step commands with Mod A verbal cues.   Skilled Therapeutic Interventions: Treatment focus on verbal expression goals. SLP facilitated session by providing extra time and supervision verbal cues to self-monitor and correct verbal errors with phonetic discrimination task from a field of two and sentence discrimination task from a field of three.  Pt answered all reading comprehension questions with 100% accuracy. Pt also educated on strategies that help best facilitate verbal expression. Pt verbalized understanding.    FIM:  Comprehension Comprehension Mode: Auditory Comprehension: 5-Understands complex 90% of the time/Cues < 10% of the time Expression Expression Mode: Verbal Expression: 5-Expresses basic 90% of the time/requires cueing < 10% of the time. Social Interaction Social Interaction: 6-Interacts appropriately with others with medication or extra time (anti-anxiety, antidepressant). Problem Solving Problem Solving: 7-Solves complex problems: Recognizes & self-corrects Memory Memory: 6-More than reasonable amt of time FIM - Eating Eating Activity: 7: Complete independence:no  helper  Pain Pain Assessment Pain Score: 0-No pain  Therapy/Group: Individual Therapy  Jaz Laningham 02/18/2013, 3:46 PM

## 2013-02-18 NOTE — Progress Notes (Signed)
While changing abdominal dressing, educated pt on dressing change and demonstrated how to change incision correctly per orders. Pt very eager to learn. Will educate pt and grandfather in the morning on dressing changes and answer all questions. Goal is for pt and/or family member to adequately change abdominal dressing and ostomy correctly with confidence. Plan to continue to educate.

## 2013-02-18 NOTE — Progress Notes (Signed)
Physical Therapy Session Note  Patient Details  Name: Dakota Evans MRN: [id number] Date of Birth: 1986-03-28  Today's Date: 02/18/2013 Time: 1100-1130 Time Calculation (min): 30 min  Short Term Goals: Week 1:  PT Short Term Goal 1 (Week 1): =LTG  Skilled Therapeutic Interventions/Progress Updates:    Ambulation 2 x 150' practicing head turns (vertical/horizontal), backwards walking, carioca, speed changes and abrupt stops supervision level. Floor transfer supervision x 1 with cues for safety and sequencing, independent x 1. Weighted ball toss to rebounder with varying bases of support (tandem, single limb stance, and compliant beam). Manual perturbations added for increased difficulty, up to min assist for high level balance tasks. Pt demonstrates decreased endurance at times and needs frequent rest breaks. When distracted pt has some difficulty gathering thoughts and communicating what he wants to say, slow to find words.   Therapy Documentation Precautions:  Precautions Precautions: Fall Precaution Comments: colostomy and wound on abdomen Restrictions Weight Bearing Restrictions: No Pain: Abdominal pain at start of treatment that progressed during treatment. Pt ready for medication by end, RN made aware.  See FIM for current functional status  Therapy/Group: Individual Therapy  Wilhemina Bonito 02/18/2013, 11:52 AM

## 2013-02-18 NOTE — Consult Note (Signed)
WOC ostomy follow up Stoma type/location: RLQ, end ileostomy. Pt has now began to participate in his ostomy care and has been emptying his pouch.  However they have been using a fairly outdated type of pouch.  Today the karaya pouch as a hole in the actual plastic bag portion so I did a did a teaching session with [name] today.  He is trying to coordinate with his grandpa to be here in the am for another teaching session.  Stomal assessment/size: 7/8" round, budded from the skin, pink and moist Peristomal assessment:  Has large volume of output and requires frequent emptying, he has had some issues with leakage but I think this may be to use of the wrong pouching system.  His peristomal skin is intact and doing well at the time of my assessment today.  Treatment options for stomal/peristomal skin: 2" skin barrier ring used to aid in seal with his volume and liquid output. Output seedy, green Ostomy pouching: 2pc. 14" used today with 2" barrier ring. Education provided:  Demonstrated pouch change to [facility] today.  He has practiced with lock and roll closure many times today also.  He does like the 2pc pouch much better. Discussed more discrete beige pouch that I can send him sample of also.  Will order supplies to the bedside for our use, and I will bring another educational booklet to him in the am.   WOC will follow closely for ostomy teaching now in rehab and more alert and able to participate in care. Kortni Hasten Humphrey RN,CWOCN  [phone]

## 2013-02-18 NOTE — Progress Notes (Signed)
Occupational Therapy Session Note  Patient Details  Name: Elige Shouse MRN: [id number] Date of Birth: [birth year]  Today's Date: 02/18/2013 Time: 4098-1191 Time Calculation (min): 45 min   Skilled Therapeutic Interventions/Progress Updates:    1:1 self care retraining at sink level- pt was able to perform obtaining all supplies, emptying ostomy bag, bathing and dressing with distant supervision to mod I. Discussed d/c date of Wed with SW, team leader and MD aware. Discussed with RN a time to setup WOC to come for education with pt and pt's grandfather. Pt with still increased heart rate to 120s but with seated rest break able to return to 90s. Performed BERG balance test and pt score a 56/56 on it without any LOB. Engaged in challenging balance on rocker platform in gym with focus on stabilizing weight statically and with control weight shift forwards and backwards. No LOB   Therapy Documentation Precautions:  Precautions Precautions: Fall Precaution Comments: colostomy and wound on abdomen Restrictions Weight Bearing Restrictions: No Pain: Pain Assessment Pain Score: 0-No pain Pain Type: Surgical pain Pain Location: Abdomen Pain Intervention(s): Medication (See eMAR)  See FIM for current functional status  Therapy/Group: Individual Therapy  Roney Mans [facility] 02/18/2013, 10:15 AM
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
## 2013-02-18 NOTE — Progress Notes (Signed)
Occupational Therapy Session Note  Patient Details  Name: Dakota Evans MRN: [id number] Date of Birth: October 05, 1985  Today's Date: 02/18/2013 Time: 1130-1200 Time Calculation (min): 30 min  Short Term Goals: Week 1:  OT Short Term Goal 1 (Week 1): LTG=STG  Skilled Therapeutic Interventions/Progress Updates:    Pt seen for 1:1 OT with focus on functional mobility and balance reactions in therapeutic activity.  Engaged in Wii tennis and bowling in moderately distracting environment.  Pt mod I with mobility and balance reactions with quick movements during Wii tennis.  Pt continues to have some word finding difficulties when describing task, however able to correct with min cues.  Pt with no LOB during higher level balance activity this session.  Therapy Documentation Precautions:  Precautions Precautions: Fall Precaution Comments: colostomy and wound on abdomen Restrictions Weight Bearing Restrictions: No Pain: Pain Assessment Pain Score: 0-No pain Faces Pain Scale: Hurts a little bit Pain Location: Abdomen Pain Orientation: Mid Pain Descriptors / Indicators: Tender Pain Onset: Progressive Pain Intervention(s): Medication (See eMAR)  See FIM for current functional status  Therapy/Group: Individual Therapy  Leonette Monarch 02/18/2013, 2:00 PM

## 2013-02-19 ENCOUNTER — Inpatient Hospital Stay (HOSPITAL_COMMUNITY): Payer: 59 | Admitting: Occupational Therapy

## 2013-02-19 ENCOUNTER — Inpatient Hospital Stay (HOSPITAL_COMMUNITY): Payer: 59 | Admitting: Speech Pathology

## 2013-02-19 ENCOUNTER — Encounter (HOSPITAL_COMMUNITY): Payer: Self-pay | Admitting: Occupational Therapy

## 2013-02-19 ENCOUNTER — Inpatient Hospital Stay (HOSPITAL_COMMUNITY): Payer: Self-pay | Admitting: Physical Therapy

## 2013-02-19 NOTE — Progress Notes (Signed)
Occupational Therapy Session Note  Patient Details  Name: Dakota Evans MRN: [id number] Date of Birth: 03-12-1986  Today's Date: 02/19/2013 Time: 0102-7253 Time Calculation (min): 27 min  OT Short Term Goals Week 1:  OT Short Term Goal 1 (Week 1): LTG=STG   Skilled Therapeutic Interventions/Progress Updates:  Patient resting in recliner upon arrival.  Ambulated to therapy gym to engage in therapeutic activity in standing to include learning and teaching new card game.  Patient shuffled cards with ease and familiarity, and given the task of hearing instructions 1 time then complete the game.  Patient required min multimodal cues to complete the card game.  Patient required mod cues to teach the card game to this clinician.  When this clinician purposefully made an error, patient quickly caught it and redirected me without assistance.  Patient dribbled ball while ambulate back to his room alternating hands with ball only getting away from him on 1 occasion and he did not need assistance to recover.  Therapy Documentation Precautions:  Precautions Precautions: None Precaution Comments: colostomy and wound on abdomen Restrictions Weight Bearing Restrictions: No Pain: Denies pain  Therapy/Group: Individual Therapy  Caramia Boutin 02/19/2013, 1:35 PM

## 2013-02-19 NOTE — Plan of Care (Signed)
Problem: RH SKIN INTEGRITY Goal: RH STG ABLE TO PERFORM INCISION/WOUND CARE W/ASSISTANCE STG Able To Perform Incision/Wound Care With max Assistance.  Outcome: Completed/Met Date Met:  02/19/13 Pt changes abdomen dressing with minimal assist from Grandfather.

## 2013-02-19 NOTE — Progress Notes (Signed)
Physical Therapy Session Note  Patient Details  Name: Langston Tuberville MRN: [id number] Date of Birth: [birth year]  Today's Date: 02/19/2013 Time: 0900 Time Calculation (min): missed time 60 mins    Skilled Therapeutic Interventions/Progress Updates:    Missed time: 60 mins due to wound care/RN care.     See FIM for current functional status  Lurena Joiner B. Yzabella Crunk, PT, DPT [phone]   02/19/2013, 9:48 AM

## 2013-02-19 NOTE — Progress Notes (Signed)
Changed ostomy bag at 1745 due to leakage around incision side. Pt changed bag with minimal assistance from nurse. Will discuss issue with WOC ostomy nurse about two piece vs. One piece bag. Pt resting in bed and eager to review bag change with RN in the morning and ready for d/c tomorrow.

## 2013-02-19 NOTE — Progress Notes (Signed)
Speech Language Pathology Session Note & Discharge Summary  Patient Details  Name: Dakota Evans MRN: [id number] Date of Birth: [birth year]  Today's Date: 02/19/2013 Time: 0865-7846 Time Calculation (min): 55 min  Skilled Therapeutic Intervention: Treatment focus on family education. Pt's grandfather present and educated on pt's verbal expression and strategies to utilize at home to increase word-finding and to increase self-monitoring and correcting of verbal errors. Pt's grandfather verbalized and demonstrated understanding. Pt participated in structured verbal expression task at the phrase level and required Min sentence completion and phonemic cues to self-correct errors. Pt also participated in dictation task at the word level with Min question cues to self-monitor and correct spelling errors.   Patient has met 5 of 5 long term goals.  Patient to discharge at overall Supervision;Modified Independent level.   Reasons goals not met: N/A   Clinical Impression/Discharge Summary: Pt has made excellent progress and has met 5 of 5 LTG's this admission. Currently, pt is demonstrating behaviors consistent with a [place]o Level VIII and is overall Mod I for cognitive functioning and overall safety. Pt requires supervision verbal cues with repetition for auditory comprehension of complex information and intermittent supervision verbal and question cues to self-monitor verbal errors and Min A phonemic and sentence completion cues to self-correct. Pt's verbal errors are characterized by phonemic paraphasias and neologisms. Pt also requires Min verbal and question cues with extra time for written expression and oral reading with basic tasks. Pt's overall reading comprehension appears intact with extra time. Pt/family education complete. Pt would benefit from f/u outpatient SLP intervention to maximize verbal expression.   Care Partner:  Caregiver Able to Provide Assistance: Yes     Recommendation:   Outpatient SLP  Rationale for SLP Follow Up: Maximize functional communication   Equipment: N/A   Reasons for discharge: Discharged from hospital;Treatment goals met   Patient/Family Agrees with Progress Made and Goals Achieved: Yes   See FIM for current functional status  Liberti Appleton 02/19/2013, 2:01 PM

## 2013-02-19 NOTE — Progress Notes (Signed)
Occupational Therapy Discharge Summary  Patient Details  Name: Dakota Evans MRN: [id number] Date of Birth: 1985-09-03  Today's Date: 02/19/2013 Time:  - 409-811  1:1 GRAD DAY! Family education with pt's grandfather with focus on activity tolerance, transitioning to community outing, signs to watch for with increased heart rate, functional mobility, ADL tasks, f/u OP SLP needs only at this time, light activity around the house for endurance building. Missed so grandfather could fit in RN education on changing wound dressing before he left for the day. (he had come in early for education with WOC RN about ostomy.)  2nd session 1:1 13:45-14:30 1:1 [place] with Rec therapy. Focus on community mobility outdoors on uneven grounds, navigating stairs outdoors, activity tolerance, dynamic balance, functional ambulation long distances, getting in and out of car mod I , simple meal prep kitchen making Ramen Noodles mod I, simple problem solving,  Engaging in word game "Call It" focusing on working memory and expression of thoughts/ words.     Discharge Note Patient has met 10 of 10 long term goals due to improved activity tolerance, improved balance, postural control, improved attention, improved awareness and improved coordination.  Patient to discharge at overall Modified Independent level for functional mobility and for ADL tasks. Pt continues to require cuing for expressive language but is supervision and can self correct with extra time. Pt's cognition is overall mod I for basic tasks.  Patient's care partners are his grandparents and they are independent to provide the necessary physical and cognitive assistance at discharge.    Reasons goals not met: n/a  Recommendation:  No f/u at this time  Equipment: No equipment provided  Reasons for discharge: treatment goals met and discharge from hospital  Patient/family agrees with progress made and goals achieved: Yes  OT  Discharge Precautions/Restrictions  Precautions Precautions: None Precaution Comments: colostomy and wound on abdomen Restrictions Weight Bearing Restrictions: No    Vital Signs Therapy Vitals Temp: 98.3 F (36.8 C) Temp src: Oral Pulse Rate: 91 Resp: 19 BP: 116/74 mmHg Patient Position, if appropriate: Lying Oxygen Therapy SpO2: 100 % O2 Device: None (Room air) Pain Pain Assessment Pain Assessment: 0-10 Pain Score: 4  Pain Type: Surgical pain Pain Location: Abdomen Pain Orientation: Mid Pain Descriptors / Indicators: Tender Pain Intervention(s): Medication (See eMAR) ADL   Vision/Perception  Vision - History Baseline Vision: No visual deficits Vision - Assessment Eye Alignment: Within Functional Limits Ocular Range of Motion: Within Functional Limits Alignment/Gaze Preference: Within Defined Limits Tracking/Visual Pursuits: Able to track stimulus in all quads without difficulty Saccades: Within functional limits Convergence: Within functional limits Additional Comments: improved right field attention Perception Perception: Within Functional Limits Praxis Praxis: Intact  Cognition Overall Cognitive Status: Within Functional Limits for tasks assessed Arousal/Alertness: Awake/alert Orientation Level: Oriented X4 Attention: Divided Divided Attention: Impaired Divided Attention Impairment: Functional complex Memory: Appears intact Awareness: Appears intact Awareness Impairment: Emergent impairment Problem Solving: Appears intact Sequencing: Appears intact Organizing: Appears intact Safety/Judgment: Appears intact [place] Scales of Cognitive Functioning: Purposeful/appropriate Sensation Sensation Light Touch: Appears Intact Stereognosis: Appears Intact Hot/Cold: Appears Intact Proprioception: Appears Intact Coordination Gross Motor Movements are Fluid and Coordinated: Yes Fine Motor Movements are Fluid and Coordinated: Yes Motor   Motor Motor: Within Functional Limits Motor - Discharge Observations: improved overall strength; still presents with decr activity tolerance requiring rest breaks Mobility  Bed Mobility Rolling Right: 7: Independent Right Sidelying to Sit: 7: Independent Sit to Supine: 6: Modified independent (Device/Increase time) Transfers Sit to Stand:  6: Modified independent (Device/Increase time) Stand to Sit: 6: Modified independent (Device/Increase time)  Trunk/Postural Assessment  Cervical Assessment Cervical Assessment: Within Functional Limits Thoracic Assessment Thoracic Assessment: Within Functional Limits Lumbar Assessment Lumbar Assessment: Within Functional Limits Postural Control Postural Control: Within Functional Limits  Balance Balance Balance Assessed: Yes Standardized Balance Assessment Standardized Balance Assessment: Berg Balance Test Berg Balance Test Sit to Stand: Able to stand without using hands and stabilize independently Standing Unsupported: Able to stand safely 2 minutes Sitting with Back Unsupported but Feet Supported on Floor or Stool: Able to sit safely and securely 2 minutes Stand to Sit: Sits safely with minimal use of hands Transfers: Able to transfer safely, minor use of hands Standing Unsupported with Eyes Closed: Able to stand 10 seconds safely Standing Ubsupported with Feet Together: Able to place feet together independently and stand 1 minute safely From Standing, Reach Forward with Outstretched Arm: Can reach confidently >25 cm (10") From Standing Position, Pick up Object from Floor: Able to pick up shoe safely and easily From Standing Position, Turn to Look Behind Over each Shoulder: Looks behind from both sides and weight shifts well Turn 360 Degrees: Able to turn 360 degrees safely in 4 seconds or less Standing Unsupported, Alternately Place Feet on Step/Stool: Able to stand independently and safely and complete 8 steps in 20 seconds Standing  Unsupported, One Foot in Front: Able to place foot tandem independently and hold 30 seconds Standing on One Leg: Able to lift leg independently and hold > 10 seconds Total Score: 56 Static Standing Balance Static Standing - Level of Assistance: 6: Modified independent (Device/Increase time) Dynamic Standing Balance Dynamic Standing - Level of Assistance: 6: Modified independent (Device/Increase time) Extremity/Trunk Assessment RUE Assessment RUE Assessment: Within Functional Limits LUE Assessment LUE Assessment: Within Functional Limits  See FIM for current functional status  Roney Mans [facility] 02/19/2013, 7:18 AM
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
## 2013-02-19 NOTE — Progress Notes (Signed)
Pt able to change abdominal dressing with minimal assistance. Educated pt and grandfather on dressing change and abdominal care. WOC nurse educated pt and grandfather on ostomy care. Pt demonstrated dressing change adequately with further education session planned for grandfather and pt in the morning. No further questions from pt or grandfather at this time.

## 2013-02-19 NOTE — Progress Notes (Signed)
Physical Therapy Discharge Summary  Patient Details  Name: Dakota Evans MRN: [id number] Date of Birth: 05/29/1986  Today's Date: 02/19/2013  Patient has met 9 of 9 long term goals due to improved activity tolerance, improved balance, improved postural control, ability to compensate for deficits and improved awareness.  Patient to discharge at an ambulatory level Modified Independent.   Patient's care partner is independent to provide the necessary physical and cognitive assistance at discharge.  Reasons goals not met: NA  Recommendation:  Patient requires no further skilled PT services.  Equipment: No equipment provided  Reasons for discharge: treatment goals met and discharge from hospital  Patient/family agrees with progress made and goals achieved: Yes  PT Discharge Precautions/Restrictions Precautions Precautions: None Precaution Comments: colostomy and wound on abdomen Restrictions Weight Bearing Restrictions: No Pain Pain Assessment Pain Score: 1  Pain Type: Acute pain Pain Location: Head Pain Descriptors / Indicators: Headache Pain Intervention(s): Medication (See eMAR)  Cognition Overall Cognitive Status: Within Functional Limits for tasks assessed Arousal/Alertness: Awake/alert Orientation Level: Oriented X4 Attention: Divided Sustained Attention: Appears intact Divided Attention: Appears intact Memory: Appears intact Awareness: Appears intact Problem Solving: Appears intact Sequencing: Appears intact Organizing: Appears intact Safety/Judgment: Appears intact Comments: Decreased word finding at times, particularly with fatigue or with distractions Rancho Mirant Scales of Cognitive Functioning: Purposeful/appropriate Sensation Sensation Light Touch: Appears Intact Stereognosis: Appears Intact Hot/Cold: Appears Intact Proprioception: Appears Intact Coordination Gross Motor Movements are Fluid and Coordinated: Yes Fine Motor Movements are Fluid  and Coordinated: Yes Motor  Motor Motor: Within Functional Limits Motor - Discharge Observations: improved overall strength; still presents with decr activity tolerance requiring rest breaks  Mobility Bed Mobility Rolling Right: 7: Independent Right Sidelying to Sit: 7: Independent Sit to Supine: 6: Modified independent (Device/Increase time) Transfers Sit to Stand: 6: Modified independent (Device/Increase time) Stand to Sit: 6: Modified independent (Device/Increase time) Stand Pivot Transfers: 6: Modified independent (Device/Increase time) Locomotion  Ambulation Ambulation: Yes Ambulation/Gait Assistance: 6: Modified independent (Device/Increase time) Ambulation Distance (Feet): 300 Feet (and greater) Assistive device: None Ambulation/Gait Assistance Details: Slight loss of balance with quick head movements however pt able to self correct. Normal gait in controlled environment pt has no loss of balance.  Gait Gait: Yes Gait Pattern: Within Functional Limits High Level Ambulation High Level Ambulation: Backwards walking;Direction changes;Sudden stops;Head turns Backwards Walking: supervision Direction Changes: modified independent Sudden Stops: modified independent Head Turns: supervision Stairs / Additional Locomotion Stairs: Yes Wheelchair Mobility Wheelchair Mobility: No (ambulation primary means of locomotion)  Trunk/Postural Assessment  Cervical Assessment Cervical Assessment: Within Functional Limits Thoracic Assessment Thoracic Assessment: Within Functional Limits Lumbar Assessment Lumbar Assessment: Within Functional Limits Postural Control Postural Control: Within Functional Limits  Balance Standardized Balance Assessment Standardized Balance Assessment: Berg Balance Test Berg Balance Test Sit to Stand: Able to stand without using hands and stabilize independently Standing Unsupported: Able to stand safely 2 minutes Sitting with Back Unsupported but Feet  Supported on Floor or Stool: Able to sit safely and securely 2 minutes Stand to Sit: Sits safely with minimal use of hands Transfers: Able to transfer safely, minor use of hands Standing Unsupported with Eyes Closed: Able to stand 10 seconds safely Standing Ubsupported with Feet Together: Able to place feet together independently and stand 1 minute safely From Standing, Reach Forward with Outstretched Arm: Can reach confidently >25 cm (10") From Standing Position, Pick up Object from Floor: Able to pick up shoe safely and easily From Standing Position, Turn to Look Behind Over each  Shoulder: Looks behind from both sides and weight shifts well Turn 360 Degrees: Able to turn 360 degrees safely in 4 seconds or less Standing Unsupported, Alternately Place Feet on Step/Stool: Able to stand independently and safely and complete 8 steps in 20 seconds Standing Unsupported, One Foot in Front: Able to place foot tandem independently and hold 30 seconds Standing on One Leg: Able to lift leg independently and hold > 10 seconds Total Score: 56 Static Standing Balance Static Standing - Level of Assistance: 6: Modified independent (Device/Increase time) Dynamic Standing Balance Dynamic Standing - Level of Assistance: 6: Modified independent (Device/Increase time) Extremity Assessment      RLE Assessment RLE Assessment: Within Functional Limits LLE Assessment LLE Assessment: Within Functional Limits  See FIM for current functional status  Dakota Evans 02/19/2013, 1:42 PM

## 2013-02-19 NOTE — Consult Note (Addendum)
WOC follow up Pt with end ileostomy, motivated to learn to change pouch and be independent with care.  He does still have some expressive issues but for the most part was able to verbalize pouch change procedure back to [facility] nurse today. Grandpa was at the bedside and I was able to answer all their questions. Had patient cut new wafer and talk me thru each step of pouch application.  Under the impression based on our conversation that patient was going to have [facility], however after talking with rehab staff he will go to outpatient therapy and will not have HH services.  For this reason I will meet with grandpa and [place] again in the am and actually have [name] perform pouch change without my assistance to see how well he is able to do all the steps.  I have marked [employer] catalog with ostomy products they need to order and given them written instructions on pouch change today.  I will send home with some ostomy products and I have enrolled him in [employer] start dc program to send free samples to his grandpa's home.  Will meet with pt and grandpa in the am prior to discharge to home [place] Eliberto Ivory RN,CWOCN [phone]  9:30 am-paged back to see patient, ostomy appliance leaking.  Noted to have leakage at 3 o'clock towards the incision, and seal of the two pc. Seems to be turing loose hear.  Suspect that he may need to use one pc. But he would like to try to use two pc. Pt has high volume output and pouch changes have to be done quickly due to the volume of output, this is why I have suggested am pouch changes.  Pt removed old pouch, cleaned his skin and with some assistance from [facility] nurse, really just to control output as the cut the new wafer.  He placed barrier ring around the stoma and then we placed wafer together, he is having to off center the hole in the wafer and cut the tape border some to accommodate his retention sutures and his open midline abdominal wound. Dyquan placed pouch onto the wafer and in  independent with lock and roll closure.   WOC will follow up later today to see if 2pc is working, if not may need to transition to one pc for flexibility.  Elizabet Schweppe [facility] RN,CWOCN
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
## 2013-02-19 NOTE — Progress Notes (Signed)
Recreational Therapy Session Note  Patient Details  Name: Richad Ramsay MRN: [id number] Date of Birth: 1986/02/05 Today's Date: 02/19/2013 Time:  1345-1530 Pain: c/o pain, unrated, stated he would request pain medicine after session Skilled Therapeutic Interventions/Progress Updates: Session focused on outdoor ambulation,safety, & cognition.  Pt ambulated on unit & hospital grounds including outdoor surfaces & up & down steps with supervision.  Pt performed simple meal prep task with supervision.  Pt participated in cognitive tabletop game focusing on memory, categorical naming, and word finding with supervision.  Pt self corrected errors without cues and did not require cuing for safety during any of the above listed tasks.  Therapy/Group: Co-Treatment  Labradford Schnitker 02/19/2013, 4:09 PM

## 2013-02-19 NOTE — Progress Notes (Signed)
Occupational Therapy Session Note  Patient Details  Name: Dakota Evans MRN: [id number] Date of Birth: 09/15/1985  Today's Date: 02/19/2013 Time: 4098-1191 Time Calculation (min): 27 min  Skilled Therapeutic Interventions/Progress Updates:    Pt worked on cognitive processing tasks playing checkers and performing 3 word recall.  Pt able to remember 2/3 items initially for gathering in the kitchen.  Also ambulated to the gym and had pt attempt to locate 3 items from memory after 2 minute delay.  As pt walked back to his room had him attempt to remember a sequence of numbers (telephone number) so that he could call the number when he returned to his room.  Pt unable to repeat sequence of numbers with multiple attempts.  When attempting to write down the numbers he would also write them in a different order than he was told.  He was able to recognize that it was not the correct sequence as he would attempt to repeat them.  Therapy Documentation Precautions:  Precautions Precautions: Other (comment) Precaution Comments: colostomy and wound on abdomen Restrictions Weight Bearing Restrictions: No  Pain: Pain Assessment Pain Score: 1  Pain Type: Acute pain Pain Location: Head Pain Descriptors / Indicators: Headache Pain Intervention(s): Medication (See eMAR)  See FIM for current functional status  Therapy/Group: Individual Therapy  Saksham Akkerman OTR/L 02/19/2013, 4:02 PM

## 2013-02-20 DIAGNOSIS — S069X9A Unspecified intracranial injury with loss of consciousness of unspecified duration, initial encounter: Secondary | ICD-10-CM

## 2013-02-20 DIAGNOSIS — S069XAA Unspecified intracranial injury with loss of consciousness status unknown, initial encounter: Secondary | ICD-10-CM

## 2013-02-20 MED ORDER — SIMETHICONE 40 MG/0.6ML PO SUSP: 80.0000 mg | Freq: Four times a day (QID) | ORAL | Status: DC | PRN

## 2013-02-20 MED ORDER — ACETAMINOPHEN 325 MG PO TABS: 325.0000 mg | ORAL_TABLET | ORAL | Status: DC | PRN

## 2013-02-20 MED ORDER — ZOLPIDEM TARTRATE 5 MG PO TABS: 5.0000 mg | ORAL_TABLET | Freq: Every evening | ORAL | Status: DC | PRN

## 2013-02-20 MED ORDER — HYDROCODONE-ACETAMINOPHEN 5-325 MG PO TABS: 1.0000 | ORAL_TABLET | Freq: Four times a day (QID) | ORAL | Status: DC | PRN

## 2013-02-20 MED ORDER — SILVER NITRATE-POT NITRATE 75-25 % EX MISC
3.0000 "application " | CUTANEOUS | Status: DC | PRN
  Filled 2013-02-20: qty 3

## 2013-02-20 MED ORDER — ATENOLOL 25 MG PO TABS: 25.0000 mg | ORAL_TABLET | Freq: Two times a day (BID) | ORAL | Status: DC

## 2013-02-20 MED ORDER — ONDANSETRON HCL 4 MG PO TABS: 4.0000 mg | ORAL_TABLET | ORAL | Status: DC | PRN

## 2013-02-20 MED ORDER — FERROUS SULFATE 325 (65 FE) MG PO TABS: 325.0000 mg | ORAL_TABLET | Freq: Three times a day (TID) | ORAL | Status: DC

## 2013-02-20 MED ORDER — HYDROCODONE-ACETAMINOPHEN 5-325 MG PO TABS
2.0000 | ORAL_TABLET | Freq: Once | ORAL | Status: AC
  Administered 2013-02-20: 2 via ORAL

## 2013-02-20 MED ORDER — LOPERAMIDE HCL 2 MG PO CAPS: 4.0000 mg | ORAL_CAPSULE | Freq: Two times a day (BID) | ORAL | Status: DC

## 2013-02-20 MED ORDER — ADULT MULTIVITAMIN W/MINERALS CH: 1.0000 | ORAL_TABLET | Freq: Every day | ORAL | Status: DC

## 2013-02-20 MED ORDER — SACCHAROMYCES BOULARDII 250 MG PO CAPS: 500.0000 mg | ORAL_CAPSULE | Freq: Two times a day (BID) | ORAL | Status: DC

## 2013-02-20 MED ORDER — LOPERAMIDE HCL 2 MG PO CAPS
4.0000 mg | ORAL_CAPSULE | Freq: Once | ORAL | Status: AC
  Administered 2013-02-20: 4 mg via ORAL
  Filled 2013-02-20: qty 2

## 2013-02-20 NOTE — Consult Note (Signed)
WOC ostomy follow up   Met with family today for additional ostomy training, however when I arrived pouch was leaking into the wound.  It appears it continues to leak toward the umbilicus.  We cut a 1pc to see if the flexibility would help with adherence however when we placed with using the starter hole the wafer was too large even if we cut it to fit over his retention sutures.  I discussed removal of the lower retention suture with trauma and they do not want any retention sutures removed.  I cut another 1pc without using the starter hole and placement of the hole at the very top edge of the wafer, however this too did not work and the pouch was over the lower retention suture.  We went back to a 2pc with the hole off center to allow the wafer to not overlap into the wound and the retention sutures.  I placed a 1/2 of 2" barrier ring in the dip in the belly at the umbilicus to try to build up this portion of the abdomen.  Pouch placed by the patient with minimal cueing.  Silver nitrate applied per the PA in the distal portion of the wound bed, this area is quite hypergranulated, bleeds very easily with any touching of the tissue.  Asked trauma if we can silver nitrate to encourage the tissue to reepithelialize.  Moist saline gauze placed by the patient after silver nitrate applied.   HHRN will be ordered now due to ostomy issues, will give a report to the WOC nurse at [facility] to help this family with support of the ostomy needs.    Kaito Schulenburg Berthold RN,CWOCN [phone]
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
## 2013-02-20 NOTE — Progress Notes (Signed)
Recreational Therapy Discharge Summary Patient Details  Name: Lathan Gieselman MRN: [id number] Date of Birth: May 17, 1986 Today's Date: 02/20/2013  Long term goals set: 1  Long term goals met: 1  Comments on progress toward goals: Pt has made excellent progress toward goal and is ready for discharge home today with grandparents to provide supervision.   Reasons for discharge: discharge from hospitalPatient/family agrees with progress made and goals achieved: Yes  Jerson Furukawa 02/20/2013, 4:09 PM

## 2013-02-20 NOTE — Plan of Care (Signed)
Problem: RH SKIN INTEGRITY Goal: RH STG SKIN FREE OF INFECTION/BREAKDOWN Outcome: Completed/Met Date Met:  02/20/13 Abdominal wound healing and free of infection.

## 2013-02-20 NOTE — Progress Notes (Signed)
Patient discharged to home at 1200 with mother . Discharge instructions given and reviewed with patient and mother by P. Love ,PA . Patient and mother verbalized understanding of discharge instructions . Ileostomy intact no leaks. Patient changed abdominal dressing and ileostomy this am with supervision of ostomy RN . Education complete with ileostomy care and wound care .                   Cleotilde Neer
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
## 2013-02-28 ENCOUNTER — Telehealth (HOSPITAL_COMMUNITY): Payer: Self-pay | Admitting: Emergency Medicine

## 2013-02-28 NOTE — Telephone Encounter (Signed)
Explained that the retention sutures could not be removed for 6 weeks and why. Grandfather was understanding and said he would tell pt.

## 2013-03-05 ENCOUNTER — Telehealth: Payer: Self-pay | Admitting: Physical Medicine & Rehabilitation

## 2013-03-05 ENCOUNTER — Telehealth (INDEPENDENT_AMBULATORY_CARE_PROVIDER_SITE_OTHER): Payer: Self-pay | Admitting: *Deleted

## 2013-03-05 NOTE — Telephone Encounter (Signed)
Pt's father called at 1028 am.. Pt needs pain med refill (HYDROcodone-acetaminophen (NORCO/VICODIN) 5-325 MG per tablet)

## 2013-03-05 NOTE — Telephone Encounter (Signed)
Shelly with [facility] called to request a verbal order to send ostomy supplies out to patient.  Spoke to Dr. Janee Morn who stated he would sign an order for the supplies.  Burnett Harry will fax the order to be signed next Monday when Dr. Janee Morn is in the office but will take a verbal order at this time to go ahead and mail the supplies to the patient.  Shelly's direct number is [phone].  Voicemail left for Shelly with the verbal order and the fax number to send the order for signature.

## 2013-03-06 MED ORDER — HYDROCODONE-ACETAMINOPHEN 5-325 MG PO TABS: 1.0000 | ORAL_TABLET | Freq: Four times a day (QID) | ORAL | Status: DC | PRN

## 2013-03-06 NOTE — Telephone Encounter (Signed)
See note below.  Pam gave him # 75 on 02/20/13 (1-2 q 6 hr).  He has an appt with you on 8/13.  Do you want to refill?

## 2013-03-06 NOTE — Telephone Encounter (Signed)
Pharmacy verified and hydrocodone called to [facility].  Dakota Evans was asking about acetaminophen and I explained it is tylenol and it is added to hydrocodone.  He ran out yesterday and so he gave him acetaminophen 325mg .  I told him to just be sure once norco is picked up that he does not double up giving acetaminophen with it. He acknowledged understanding.

## 2013-03-06 NOTE — Telephone Encounter (Signed)
Yes, we may refill. A phone in rx was written

## 2013-03-13 ENCOUNTER — Telehealth: Payer: Self-pay

## 2013-03-13 NOTE — Telephone Encounter (Signed)
Patient is being discharged from home health speech therapy and it is recommended for out patient speech therapy.  Patient has an appointment 03/20/13.

## 2013-03-15 ENCOUNTER — Ambulatory Visit (INDEPENDENT_AMBULATORY_CARE_PROVIDER_SITE_OTHER): Payer: 59 | Admitting: Internal Medicine

## 2013-03-15 ENCOUNTER — Encounter (INDEPENDENT_AMBULATORY_CARE_PROVIDER_SITE_OTHER): Payer: Self-pay

## 2013-03-15 VITALS — BP 140/82 | HR 76 | Resp 16 | Ht 72.0 in | Wt 191.6 lb

## 2013-03-15 DIAGNOSIS — S31139D Puncture wound of abdominal wall without foreign body, unspecified quadrant without penetration into peritoneal cavity, subsequent encounter: Secondary | ICD-10-CM

## 2013-03-15 DIAGNOSIS — W3400XD Accidental discharge from unspecified firearms or gun, subsequent encounter: Secondary | ICD-10-CM

## 2013-03-15 DIAGNOSIS — Z5189 Encounter for other specified aftercare: Secondary | ICD-10-CM
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
## 2013-03-15 NOTE — Patient Instructions (Addendum)
Follow up in one month for recheck May leave wound open to air. Ok to shower

## 2013-03-20 ENCOUNTER — Encounter: Payer: Self-pay | Admitting: Physical Medicine & Rehabilitation

## 2013-03-20 ENCOUNTER — Encounter: Payer: 59 | Attending: Physical Medicine & Rehabilitation | Admitting: Physical Medicine & Rehabilitation

## 2013-03-20 DIAGNOSIS — S31109A Unspecified open wound of abdominal wall, unspecified quadrant without penetration into peritoneal cavity, initial encounter: Secondary | ICD-10-CM | POA: Insufficient documentation

## 2013-03-20 DIAGNOSIS — R41841 Cognitive communication deficit: Secondary | ICD-10-CM | POA: Insufficient documentation

## 2013-03-20 DIAGNOSIS — W3400XD Accidental discharge from unspecified firearms or gun, subsequent encounter: Secondary | ICD-10-CM

## 2013-03-20 DIAGNOSIS — W3400XA Accidental discharge from unspecified firearms or gun, initial encounter: Secondary | ICD-10-CM | POA: Insufficient documentation

## 2013-03-20 DIAGNOSIS — Z5189 Encounter for other specified aftercare: Secondary | ICD-10-CM

## 2013-03-20 DIAGNOSIS — R4701 Aphasia: Secondary | ICD-10-CM

## 2013-03-20 DIAGNOSIS — S31139D Puncture wound of abdominal wall without foreign body, unspecified quadrant without penetration into peritoneal cavity, subsequent encounter: Secondary | ICD-10-CM

## 2013-03-20 DIAGNOSIS — S069X9A Unspecified intracranial injury with loss of consciousness of unspecified duration, initial encounter: Secondary | ICD-10-CM | POA: Insufficient documentation

## 2013-03-20 DIAGNOSIS — S069XAA Unspecified intracranial injury with loss of consciousness status unknown, initial encounter: Secondary | ICD-10-CM | POA: Insufficient documentation
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
## 2013-03-20 NOTE — Patient Instructions (Signed)
WORK ON BASIC EXERCISES TO INCREASE YOUR CORE STRENGTH AND GENERAL STAMINA. AVOID HEAVY LIFTING (MORE THAN 25LBS)  FOLLOW UP WITH SURGERY REGARDING SPECIFIC RESTRICTIONS WITH WEIGHT AND ACTIVITY REGARDING WORK.

## 2013-04-12 ENCOUNTER — Telehealth (HOSPITAL_COMMUNITY): Payer: Self-pay | Admitting: Emergency Medicine

## 2013-04-12 ENCOUNTER — Ambulatory Visit (INDEPENDENT_AMBULATORY_CARE_PROVIDER_SITE_OTHER): Payer: 59 | Admitting: Internal Medicine

## 2013-04-12 ENCOUNTER — Encounter (INDEPENDENT_AMBULATORY_CARE_PROVIDER_SITE_OTHER): Payer: Self-pay

## 2013-04-12 VITALS — BP 128/92 | HR 78 | Temp 97.9°F | Resp 15 | Ht 72.0 in | Wt 193.6 lb

## 2013-04-12 DIAGNOSIS — S31139D Puncture wound of abdominal wall without foreign body, unspecified quadrant without penetration into peritoneal cavity, subsequent encounter: Secondary | ICD-10-CM

## 2013-04-12 DIAGNOSIS — W3400XD Accidental discharge from unspecified firearms or gun, subsequent encounter: Secondary | ICD-10-CM

## 2013-04-12 DIAGNOSIS — K631 Perforation of intestine (nontraumatic): Secondary | ICD-10-CM

## 2013-04-12 DIAGNOSIS — Z5189 Encounter for other specified aftercare: Secondary | ICD-10-CM

## 2013-04-12 NOTE — Patient Instructions (Addendum)
Follow up with Dr. Janee Morn in about 2 months to discuss reversal surgery  Increase activity level but still no heavy lifting for another 2-3 weeks, then may start increasing lifting slowing.  Watch for severe pain.

## 2013-04-12 NOTE — Telephone Encounter (Signed)
Called back, no answer, pt to arrive by 10am can call to reschedule
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
## 2013-04-29 ENCOUNTER — Encounter: Payer: 59 | Attending: Physical Medicine & Rehabilitation | Admitting: Physical Medicine & Rehabilitation

## 2013-04-29 ENCOUNTER — Encounter: Payer: Self-pay | Admitting: Physical Medicine & Rehabilitation

## 2013-04-29 DIAGNOSIS — X58XXXS Exposure to other specified factors, sequela: Secondary | ICD-10-CM | POA: Insufficient documentation

## 2013-04-29 DIAGNOSIS — S069XAS Unspecified intracranial injury with loss of consciousness status unknown, sequela: Secondary | ICD-10-CM | POA: Insufficient documentation

## 2013-04-29 DIAGNOSIS — R4789 Other speech disturbances: Secondary | ICD-10-CM | POA: Insufficient documentation

## 2013-04-29 DIAGNOSIS — S069X9S Unspecified intracranial injury with loss of consciousness of unspecified duration, sequela: Secondary | ICD-10-CM | POA: Insufficient documentation

## 2013-04-29 DIAGNOSIS — W3400XA Accidental discharge from unspecified firearms or gun, initial encounter: Secondary | ICD-10-CM | POA: Insufficient documentation

## 2013-04-29 DIAGNOSIS — S31109A Unspecified open wound of abdominal wall, unspecified quadrant without penetration into peritoneal cavity, initial encounter: Secondary | ICD-10-CM | POA: Insufficient documentation

## 2013-04-29 DIAGNOSIS — I10 Essential (primary) hypertension: Secondary | ICD-10-CM | POA: Insufficient documentation
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
## 2013-04-29 NOTE — Patient Instructions (Signed)
CALL ME WITH ANY PROBLEMS OR QUESTIONS (#[phone]).  HAVE A GOOD DAY   IF YOU DON'T HERE FROM [facility] PLEASE CALL!!!!!!

## 2013-05-06 ENCOUNTER — Ambulatory Visit: Payer: 59 | Attending: Physical Medicine & Rehabilitation | Admitting: Speech Pathology

## 2013-05-06 ENCOUNTER — Ambulatory Visit: Payer: 59 | Admitting: Speech Pathology

## 2013-05-06 DIAGNOSIS — S069XAS Unspecified intracranial injury with loss of consciousness status unknown, sequela: Secondary | ICD-10-CM | POA: Insufficient documentation

## 2013-05-06 DIAGNOSIS — S069X9S Unspecified intracranial injury with loss of consciousness of unspecified duration, sequela: Secondary | ICD-10-CM | POA: Insufficient documentation

## 2013-05-06 DIAGNOSIS — R4701 Aphasia: Secondary | ICD-10-CM | POA: Insufficient documentation

## 2013-05-06 DIAGNOSIS — IMO0001 Reserved for inherently not codable concepts without codable children: Secondary | ICD-10-CM | POA: Insufficient documentation

## 2013-05-10 ENCOUNTER — Telehealth: Payer: Self-pay | Admitting: Physical Medicine & Rehabilitation

## 2013-05-10 NOTE — Telephone Encounter (Signed)
Patient needs a letter stating when he can go back to work.  Any questions please call patient

## 2013-05-13 ENCOUNTER — Ambulatory Visit: Payer: 59 | Attending: Physical Medicine & Rehabilitation

## 2013-05-13 DIAGNOSIS — R4701 Aphasia: Secondary | ICD-10-CM | POA: Insufficient documentation

## 2013-05-13 DIAGNOSIS — IMO0001 Reserved for inherently not codable concepts without codable children: Secondary | ICD-10-CM | POA: Insufficient documentation

## 2013-05-13 NOTE — Telephone Encounter (Signed)
Left message for patient regarding letter for going back to work.  In last office note patient could potentially return to work in January.

## 2013-05-13 NOTE — Telephone Encounter (Signed)
Patient is going to come to the office and pick up last office note regarding work status.  He will sign a release of records as well.

## 2013-05-15 ENCOUNTER — Ambulatory Visit: Payer: 59 | Admitting: Speech Pathology

## 2013-05-20 ENCOUNTER — Ambulatory Visit: Payer: 59

## 2013-05-23 ENCOUNTER — Ambulatory Visit: Payer: 59

## 2013-05-27 ENCOUNTER — Encounter: Payer: 59 | Attending: Physical Medicine & Rehabilitation | Admitting: Physical Medicine & Rehabilitation

## 2013-05-27 ENCOUNTER — Ambulatory Visit: Payer: 59

## 2013-05-27 DIAGNOSIS — W3400XA Accidental discharge from unspecified firearms or gun, initial encounter: Secondary | ICD-10-CM | POA: Insufficient documentation

## 2013-05-27 DIAGNOSIS — S069XAA Unspecified intracranial injury with loss of consciousness status unknown, initial encounter: Secondary | ICD-10-CM | POA: Insufficient documentation

## 2013-05-27 DIAGNOSIS — R41841 Cognitive communication deficit: Secondary | ICD-10-CM | POA: Insufficient documentation

## 2013-05-27 DIAGNOSIS — S069X9A Unspecified intracranial injury with loss of consciousness of unspecified duration, initial encounter: Secondary | ICD-10-CM | POA: Insufficient documentation

## 2013-05-27 DIAGNOSIS — S31109A Unspecified open wound of abdominal wall, unspecified quadrant without penetration into peritoneal cavity, initial encounter: Secondary | ICD-10-CM | POA: Insufficient documentation

## 2013-05-29 ENCOUNTER — Ambulatory Visit: Payer: 59

## 2013-05-31 ENCOUNTER — Emergency Department (HOSPITAL_COMMUNITY)
Admission: EM | Admit: 2013-05-31 | Discharge: 2013-05-31 | Disposition: A | Discharge status: Home / Self Care | Payer: 59 | Attending: Emergency Medicine | Admitting: Emergency Medicine

## 2013-05-31 ENCOUNTER — Encounter (HOSPITAL_COMMUNITY): Payer: Self-pay | Admitting: Emergency Medicine

## 2013-05-31 DIAGNOSIS — Z9889 Other specified postprocedural states: Secondary | ICD-10-CM | POA: Insufficient documentation

## 2013-05-31 DIAGNOSIS — K9413 Enterostomy malfunction: Secondary | ICD-10-CM | POA: Insufficient documentation

## 2013-05-31 DIAGNOSIS — I10 Essential (primary) hypertension: Secondary | ICD-10-CM | POA: Insufficient documentation

## 2013-05-31 DIAGNOSIS — Y833 Surgical operation with formation of external stoma as the cause of abnormal reaction of the patient, or of later complication, without mention of misadventure at the time of the procedure: Secondary | ICD-10-CM | POA: Insufficient documentation

## 2013-05-31 DIAGNOSIS — K9403 Colostomy malfunction: Secondary | ICD-10-CM
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
## 2013-05-31 NOTE — ED Notes (Signed)
Waiting for material to bring pt colostomy bags

## 2013-05-31 NOTE — ED Notes (Signed)
Colostomy bag seal coming loose. Needs and new one.

## 2013-05-31 NOTE — ED Notes (Addendum)
Pt is here to replace leaking colostomy bag, stated he will not have his supplies till tuseday.

## 2013-05-31 NOTE — ED Notes (Signed)
Pt colostomy bag replaced. And secured

## 2013-06-01 NOTE — ED Provider Notes (Signed)
Medical screening examination/treatment/procedure(s) were performed by non-physician practitioner and as supervising physician I was immediately available for consultation/collaboration.  EKG Interpretation   None         Devanshi Califf M Akiel Fennell, MD 06/01/13 0011 

## 2013-06-03 ENCOUNTER — Ambulatory Visit: Payer: 59

## 2013-06-05 ENCOUNTER — Ambulatory Visit: Payer: 59

## 2013-06-12 ENCOUNTER — Ambulatory Visit: Payer: 59

## 2013-06-19 ENCOUNTER — Ambulatory Visit: Payer: 59

## 2013-06-26 ENCOUNTER — Ambulatory Visit (INDEPENDENT_AMBULATORY_CARE_PROVIDER_SITE_OTHER): Payer: 59 | Admitting: General Surgery

## 2013-06-26 ENCOUNTER — Encounter (INDEPENDENT_AMBULATORY_CARE_PROVIDER_SITE_OTHER): Payer: Self-pay | Admitting: General Surgery

## 2013-06-26 VITALS — BP 130/88 | HR 72 | Temp 97.6°F | Resp 16 | Ht 73.0 in | Wt 207.0 lb

## 2013-06-26 DIAGNOSIS — Z932 Ileostomy status: Secondary | ICD-10-CM
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
## 2013-06-26 NOTE — Patient Instructions (Signed)
We will call you regarding scheduling your surgery

## 2013-06-26 NOTE — Addendum Note (Signed)
Addended by: Liz Malady on: 06/26/2013 12:40 PM   Modules accepted: Orders

## 2013-07-10 ENCOUNTER — Encounter (HOSPITAL_COMMUNITY): Payer: Self-pay | Admitting: Pharmacy Technician

## 2013-07-12 ENCOUNTER — Emergency Department (HOSPITAL_COMMUNITY): Payer: 59

## 2013-07-12 ENCOUNTER — Encounter (HOSPITAL_COMMUNITY): Payer: Self-pay

## 2013-07-12 ENCOUNTER — Inpatient Hospital Stay (HOSPITAL_COMMUNITY): Payer: 59

## 2013-07-12 ENCOUNTER — Inpatient Hospital Stay (HOSPITAL_COMMUNITY)
Admission: EM | Admit: 2013-07-12 | Discharge: 2013-07-13 | DRG: 101 | Disposition: A | Discharge status: Home / Self Care | Payer: 59 | Attending: Internal Medicine | Admitting: Internal Medicine

## 2013-07-12 ENCOUNTER — Encounter (HOSPITAL_COMMUNITY): Payer: Self-pay | Admitting: Emergency Medicine

## 2013-07-12 ENCOUNTER — Encounter (HOSPITAL_COMMUNITY)
Admission: RE | Admit: 2013-07-12 | Discharge: 2013-07-12 | Disposition: A | Discharge status: Home / Self Care | Payer: 59 | Source: Ambulatory Visit | Attending: General Surgery | Admitting: General Surgery

## 2013-07-12 ENCOUNTER — Telehealth (INDEPENDENT_AMBULATORY_CARE_PROVIDER_SITE_OTHER): Payer: Self-pay | Admitting: *Deleted

## 2013-07-12 DIAGNOSIS — E872 Acidosis, unspecified: Secondary | ICD-10-CM

## 2013-07-12 DIAGNOSIS — R569 Unspecified convulsions: Principal | ICD-10-CM | POA: Diagnosis present

## 2013-07-12 DIAGNOSIS — S0291XS Unspecified fracture of skull, sequela: Secondary | ICD-10-CM

## 2013-07-12 DIAGNOSIS — E876 Hypokalemia: Secondary | ICD-10-CM

## 2013-07-12 DIAGNOSIS — Z432 Encounter for attention to ileostomy: Secondary | ICD-10-CM

## 2013-07-12 DIAGNOSIS — Z932 Ileostomy status: Secondary | ICD-10-CM

## 2013-07-12 DIAGNOSIS — I10 Essential (primary) hypertension: Secondary | ICD-10-CM

## 2013-07-12 LAB — URINALYSIS, DIPSTICK ONLY
Bilirubin Urine: NEGATIVE
Glucose, UA: NEGATIVE mg/dL
Ketones, ur: NEGATIVE mg/dL
Protein, ur: 100 mg/dL — AB
Specific Gravity, Urine: 1.011 (ref 1.005–1.030)
Urobilinogen, UA: 0.2 mg/dL (ref 0.0–1.0)

## 2013-07-12 LAB — COMPREHENSIVE METABOLIC PANEL
ALT: 47 U/L (ref 0–53)
AST: 33 U/L (ref 0–37)
Albumin: 4.2 g/dL (ref 3.5–5.2)
Alkaline Phosphatase: 164 U/L — ABNORMAL HIGH (ref 39–117)
BUN: 5 mg/dL — ABNORMAL LOW (ref 6–23)
CO2: 11 mEq/L — ABNORMAL LOW (ref 19–32)
CO2: 29 mEq/L (ref 19–32)
Calcium: 9.6 mg/dL (ref 8.4–10.5)
Chloride: 92 mEq/L — ABNORMAL LOW (ref 96–112)
GFR calc Af Amer: >90 mL/min (ref >90)
GFR calc non Af Amer: 76 mL/min — ABNORMAL LOW (ref >90)
GFR calc non Af Amer: 88 mL/min — ABNORMAL LOW (ref >90)
Glucose, Bld: 95 mg/dL (ref 70–99)
Potassium: 3.3 mEq/L — ABNORMAL LOW (ref 3.5–5.1)
Sodium: 137 mEq/L (ref 135–145)
Total Bilirubin: 0.3 mg/dL (ref 0.3–1.2)
Total Bilirubin: 0.4 mg/dL (ref 0.3–1.2)
Total Protein: 8.7 g/dL — ABNORMAL HIGH (ref 6.0–8.3)
Total Protein: 8.9 g/dL — ABNORMAL HIGH (ref 6.0–8.3)

## 2013-07-12 LAB — POCT I-STAT TROPONIN I

## 2013-07-12 LAB — SALICYLATE LEVEL: Salicylate Lvl: <2 mg/dL — ABNORMAL LOW (ref 2.8–20.0)

## 2013-07-12 LAB — CBC
HCT: 44.2 % (ref 39.0–52.0)
HCT: 45.9 % (ref 39.0–52.0)
Hemoglobin: 15.3 g/dL (ref 13.0–17.0)
MCH: 28.5 pg (ref 26.0–34.0)
MCHC: 34.6 g/dL (ref 30.0–36.0)
Platelets: 256 10*3/uL (ref 150–400)
RBC: 5.61 MIL/uL (ref 4.22–5.81)
RDW: 14.3 % (ref 11.5–15.5)
RDW: 14.1 % (ref 11.5–15.5)
WBC: 11 10*3/uL — ABNORMAL HIGH (ref 4.0–10.5)
WBC: 12.4 10*3/uL — ABNORMAL HIGH (ref 4.0–10.5)

## 2013-07-12 LAB — GLUCOSE, CAPILLARY: Glucose-Capillary: 121 mg/dL — ABNORMAL HIGH (ref 70–99)

## 2013-07-12 LAB — POCT I-STAT 3, VENOUS BLOOD GAS (G3P V)
Bicarbonate: 11.2 mEq/L — ABNORMAL LOW (ref 20.0–24.0)
Bicarbonate: 26.1 mEq/L — ABNORMAL HIGH (ref 20.0–24.0)
O2 Saturation: 84 %
TCO2: 12 mmol/L (ref 0–100)
pCO2, Ven: 29.6 mmHg — ABNORMAL LOW (ref 45.0–50.0)
pCO2, Ven: 47.6 mmHg (ref 45.0–50.0)
pH, Ven: 7.187 — CL (ref 7.250–7.300)
pH, Ven: 7.346 — ABNORMAL HIGH (ref 7.250–7.300)
pO2, Ven: 59 mmHg — ABNORMAL HIGH (ref 30.0–45.0)
pO2, Ven: 26 mmHg — CL (ref 30.0–45.0)

## 2013-07-12 LAB — CK: Total CK: 749 U/L — ABNORMAL HIGH (ref 7–232)

## 2013-07-12 LAB — LACTIC ACID, PLASMA
Lactic Acid, Venous: 1.8 mmol/L (ref 0.5–2.2)
Lactic Acid, Venous: 1.2 mmol/L (ref 0.5–2.2)

## 2013-07-12 LAB — RAPID URINE DRUG SCREEN, HOSP PERFORMED
Amphetamines: NOT DETECTED
Benzodiazepines: NOT DETECTED
Opiates: NOT DETECTED
Tetrahydrocannabinol: POSITIVE — AB

## 2013-07-12 LAB — TROPONIN I: Troponin I: <0.3 ng/mL (ref <0.30)

## 2013-07-12 MED ORDER — SODIUM CHLORIDE 0.9 % IJ SOLN: 3.0000 mL | INTRAMUSCULAR | Status: DC | PRN

## 2013-07-12 MED ORDER — SODIUM CHLORIDE 0.9 % IV SOLN
500.0000 mg | Freq: Two times a day (BID) | INTRAVENOUS | Status: DC
  Administered 2013-07-13 (×2): 500 mg via INTRAVENOUS
  Filled 2013-07-12 (×2): qty 5

## 2013-07-12 MED ORDER — SODIUM CHLORIDE 0.9 % IV BOLUS (SEPSIS)
1000.0000 mL | Freq: Once | INTRAVENOUS | Status: AC
  Administered 2013-07-12: 1000 mL via INTRAVENOUS

## 2013-07-12 MED ORDER — ASPIRIN EC 81 MG PO TBEC
81.0000 mg | DELAYED_RELEASE_TABLET | Freq: Every day | ORAL | Status: DC
  Administered 2013-07-12 – 2013-07-13 (×2): 81 mg via ORAL
  Filled 2013-07-12 (×2): qty 1

## 2013-07-12 MED ORDER — SODIUM CHLORIDE 0.9 % IJ SOLN
3.0000 mL | Freq: Two times a day (BID) | INTRAMUSCULAR | Status: DC
  Administered 2013-07-12: 3 mL via INTRAVENOUS

## 2013-07-12 MED ORDER — ACETAMINOPHEN 650 MG RE SUPP: 650.0000 mg | Freq: Four times a day (QID) | RECTAL | Status: DC | PRN

## 2013-07-12 MED ORDER — ONDANSETRON HCL 4 MG/2ML IJ SOLN: 4.0000 mg | Freq: Four times a day (QID) | INTRAMUSCULAR | Status: DC | PRN

## 2013-07-12 MED ORDER — ACETAMINOPHEN 325 MG PO TABS: 650.0000 mg | ORAL_TABLET | Freq: Four times a day (QID) | ORAL | Status: DC | PRN

## 2013-07-12 MED ORDER — POTASSIUM CHLORIDE 10 MEQ/100ML IV SOLN
10.0000 meq | INTRAVENOUS | Status: AC
  Administered 2013-07-12 (×3): 10 meq via INTRAVENOUS
  Filled 2013-07-12 (×3): qty 100

## 2013-07-12 MED ORDER — HEPARIN SODIUM (PORCINE) 5000 UNIT/ML IJ SOLN
5000.0000 [IU] | Freq: Three times a day (TID) | INTRAMUSCULAR | Status: DC
  Administered 2013-07-12: 5000 [IU] via SUBCUTANEOUS
  Filled 2013-07-12 (×5): qty 1

## 2013-07-12 MED ORDER — ONDANSETRON HCL 4 MG PO TABS: 4.0000 mg | ORAL_TABLET | Freq: Four times a day (QID) | ORAL | Status: DC | PRN

## 2013-07-12 MED ORDER — SODIUM CHLORIDE 0.9 % IV SOLN
1000.0000 mg | INTRAVENOUS | Status: AC
  Administered 2013-07-12: 1000 mg via INTRAVENOUS
  Filled 2013-07-12: qty 10

## 2013-07-12 MED ORDER — SODIUM CHLORIDE 0.9 % IV SOLN
250.0000 mL | INTRAVENOUS | Status: DC
  Administered 2013-07-12: 250 mL via INTRAVENOUS
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
## 2013-07-12 NOTE — ED Notes (Signed)
Masneri, MD informed of abnormal lab test results 

## 2013-07-12 NOTE — ED Notes (Signed)
EEG Tech at bedside performing EEG.

## 2013-07-12 NOTE — Progress Notes (Signed)
EEG completed; results pending.    

## 2013-07-12 NOTE — ED Notes (Signed)
PCRAY done
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
## 2013-07-12 NOTE — ED Notes (Signed)
Log rolled off LSB & assessed by ED MD. Philllie collar applied

## 2013-07-12 NOTE — ED Notes (Signed)
Transporting patient to new room assignment. 

## 2013-07-12 NOTE — ED Notes (Signed)
Volunteer witnessed pt walking into hospital fall, hit head on wall & seizure like activity lasting approx a minute. Pt post ictal upon RRT nurse arrival.

## 2013-07-12 NOTE — ED Notes (Signed)
Lab results reported to EDP. 

## 2013-07-12 NOTE — ED Notes (Signed)
Masneri, MD informed of abnormal lab test results

## 2013-07-12 NOTE — ED Notes (Signed)
Neurology at bedside.

## 2013-07-12 NOTE — ED Notes (Signed)
MD's at Bedside.

## 2013-07-12 NOTE — ED Notes (Signed)
Pt given sprite to drink. EEG tech will be down to perform test here in ED.
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
## 2013-07-12 NOTE — ED Notes (Signed)
Back from ct family here

## 2013-07-12 NOTE — Significant Event (Signed)
Rapid Response Event Note  Overview: Time Called: 0845 Arrival Time: 0846 Event Type: Neurologic  Initial Focused Assessment: Volunteers heard this gentleman fall into the wall near the main entrance in the [employer].  They stated that he hit the wall so hard it shook, staff from the bakery came into the hallway to see what caused the disturbance.  The volunteers noted that he hit his head against the wall and fell to the floor.  The also stated that he had seizure activity. Upon my arrival patient patient was lying on the floor being supported by volunteers.  He was unresponsive, labored breathing and rapid pulse, diaphoretic.    Interventions: Supported patient lying on floor with c-spine stabilization.  Notified ED on need for Stretcher and backboard. He started to become responsive, but not conversant or follow commands. In route to ED patient moving all extremities and becoming verbal. He was able to  Answer questions and follow commands upon arrival to ED.  Event Summary:   at      at    Outcome: Other (Comment) (Transported to ED)  Event End Time: 0935  Marcellina Millin
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
## 2013-07-12 NOTE — ED Notes (Signed)
Family at bedside. 

## 2013-07-12 NOTE — ED Notes (Signed)
To ct
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
## 2013-07-12 NOTE — ED Notes (Signed)
Hospitalist at bedside 

## 2013-07-12 NOTE — ED Notes (Signed)
EEG still in progress

## 2013-07-13 LAB — BASIC METABOLIC PANEL
BUN: 3 mg/dL — ABNORMAL LOW (ref 6–23)
Chloride: 104 mEq/L (ref 96–112)
Creatinine, Ser: 1.02 mg/dL (ref 0.50–1.35)
GFR calc Af Amer: >90 mL/min (ref >90)
Glucose, Bld: 95 mg/dL (ref 70–99)
Potassium: 3.4 mEq/L — ABNORMAL LOW (ref 3.5–5.1)
Sodium: 139 mEq/L (ref 135–145)

## 2013-07-13 LAB — CBC
HCT: 37.8 % — ABNORMAL LOW (ref 39.0–52.0)
Hemoglobin: 13.2 g/dL (ref 13.0–17.0)
MCHC: 34.9 g/dL (ref 30.0–36.0)
MCV: 79.1 fL (ref 78.0–100.0)
RBC: 4.78 MIL/uL (ref 4.22–5.81)

## 2013-07-13 MED ORDER — POTASSIUM CHLORIDE ER 10 MEQ PO TBCR: 20.0000 meq | EXTENDED_RELEASE_TABLET | Freq: Every day | ORAL | Status: DC

## 2013-07-13 MED ORDER — LEVETIRACETAM 500 MG PO TABS: 500.0000 mg | ORAL_TABLET | Freq: Two times a day (BID) | ORAL | Status: DC
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
## 2013-07-15 ENCOUNTER — Encounter (HOSPITAL_BASED_OUTPATIENT_CLINIC_OR_DEPARTMENT_OTHER): Payer: Self-pay | Admitting: Emergency Medicine

## 2013-07-15 ENCOUNTER — Telehealth (INDEPENDENT_AMBULATORY_CARE_PROVIDER_SITE_OTHER): Payer: Self-pay | Admitting: General Surgery

## 2013-07-15 ENCOUNTER — Emergency Department (HOSPITAL_BASED_OUTPATIENT_CLINIC_OR_DEPARTMENT_OTHER)
Admission: EM | Admit: 2013-07-15 | Discharge: 2013-07-15 | Disposition: A | Discharge status: Home / Self Care | Payer: 59 | Attending: Emergency Medicine | Admitting: Emergency Medicine

## 2013-07-15 ENCOUNTER — Encounter (HOSPITAL_COMMUNITY): Payer: Self-pay | Admitting: Vascular Surgery

## 2013-07-15 DIAGNOSIS — I10 Essential (primary) hypertension: Secondary | ICD-10-CM | POA: Insufficient documentation

## 2013-07-15 DIAGNOSIS — R509 Fever, unspecified: Secondary | ICD-10-CM | POA: Insufficient documentation

## 2013-07-15 DIAGNOSIS — G40909 Epilepsy, unspecified, not intractable, without status epilepticus: Secondary | ICD-10-CM | POA: Insufficient documentation

## 2013-07-15 DIAGNOSIS — F17200 Nicotine dependence, unspecified, uncomplicated: Secondary | ICD-10-CM | POA: Insufficient documentation

## 2013-07-15 DIAGNOSIS — E860 Dehydration: Secondary | ICD-10-CM | POA: Insufficient documentation

## 2013-07-15 DIAGNOSIS — R1110 Vomiting, unspecified: Secondary | ICD-10-CM | POA: Insufficient documentation

## 2013-07-15 DIAGNOSIS — Z87828 Personal history of other (healed) physical injury and trauma: Secondary | ICD-10-CM | POA: Insufficient documentation

## 2013-07-15 DIAGNOSIS — Z79899 Other long term (current) drug therapy: Secondary | ICD-10-CM | POA: Insufficient documentation

## 2013-07-15 HISTORY — DX: Accidental discharge from unspecified firearms or gun, initial encounter: W34.00XA

## 2013-07-15 HISTORY — DX: Unspecified convulsions: R56.9

## 2013-07-15 LAB — COMPREHENSIVE METABOLIC PANEL
ALT: 37 U/L (ref 0–53)
AST: 32 U/L (ref 0–37)
Albumin: 4.7 g/dL (ref 3.5–5.2)
Alkaline Phosphatase: 179 U/L — ABNORMAL HIGH (ref 39–117)
CO2: 20 mEq/L (ref 19–32)
Chloride: 98 mEq/L (ref 96–112)
Creatinine, Ser: 1.2 mg/dL (ref 0.50–1.35)
GFR calc non Af Amer: 83 mL/min — ABNORMAL LOW (ref >90)
Glucose, Bld: 115 mg/dL — ABNORMAL HIGH (ref 70–99)
Sodium: 135 mEq/L (ref 135–145)
Total Bilirubin: 0.5 mg/dL (ref 0.3–1.2)

## 2013-07-15 LAB — URINALYSIS, ROUTINE W REFLEX MICROSCOPIC
Glucose, UA: NEGATIVE mg/dL
Leukocytes, UA: NEGATIVE
Nitrite: NEGATIVE
Protein, ur: 100 mg/dL — AB
Specific Gravity, Urine: 1.031 — ABNORMAL HIGH (ref 1.005–1.030)
pH: 6 (ref 5.0–8.0)

## 2013-07-15 LAB — CBC WITH DIFFERENTIAL/PLATELET
Basophils Absolute: 0 10*3/uL (ref 0.0–0.1)
Eosinophils Relative: 1 % (ref 0–5)
HCT: 47.2 % (ref 39.0–52.0)
Lymphocytes Relative: 8 % — ABNORMAL LOW (ref 12–46)
Lymphs Abs: 0.7 10*3/uL (ref 0.7–4.0)
Monocytes Absolute: 0.7 10*3/uL (ref 0.1–1.0)
Neutro Abs: 7.3 10*3/uL (ref 1.7–7.7)
RBC: 6.02 MIL/uL — ABNORMAL HIGH (ref 4.22–5.81)
RDW: 14.8 % (ref 11.5–15.5)
WBC: 8.8 10*3/uL (ref 4.0–10.5)

## 2013-07-15 LAB — URINE MICROSCOPIC-ADD ON

## 2013-07-15 MED ORDER — ACETAMINOPHEN 325 MG PO TABS
650.0000 mg | ORAL_TABLET | Freq: Once | ORAL | Status: AC
  Administered 2013-07-15: 650 mg via ORAL
  Filled 2013-07-15: qty 2

## 2013-07-15 MED ORDER — SODIUM CHLORIDE 0.9 % IV BOLUS (SEPSIS)
1000.0000 mL | Freq: Once | INTRAVENOUS | Status: AC
  Administered 2013-07-15: 1000 mL via INTRAVENOUS
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
## 2013-07-15 NOTE — ED Notes (Signed)
Pt reports abdominal cramping, diarrhea, and vomiting this am.  D/C'd from [facility] Saturday after new onset seizure diagnosis.  Started on Keppra and Potassium.

## 2013-07-15 NOTE — Telephone Encounter (Signed)
I discussed his seizure with Dr. Leroy Kennedy from neurology. He states we need to hold-off on surgery until his Keppra levels are stabilized. F/U with [facility] is planned. I called Jru to discuss and got VM so I left a message.

## 2013-07-15 NOTE — Progress Notes (Signed)
I agree, see my note.  Chris Dan Dissinger, MD 
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
## 2013-07-15 NOTE — H&P (Signed)
I agree, see my note.  Chris Sonji Starkes, MD 

## 2013-07-15 NOTE — ED Notes (Signed)
Pt is unable to void.  Urinal provided.

## 2013-07-15 NOTE — ED Notes (Signed)
He is still not able to void

## 2013-07-15 NOTE — Discharge Summary (Signed)
I agree , see my note

## 2013-07-16 ENCOUNTER — Encounter: Payer: Self-pay | Admitting: Physical Medicine & Rehabilitation

## 2013-07-16 ENCOUNTER — Telehealth (INDEPENDENT_AMBULATORY_CARE_PROVIDER_SITE_OTHER): Payer: Self-pay | Admitting: General Surgery

## 2013-07-16 ENCOUNTER — Encounter: Payer: 59 | Attending: Physical Medicine & Rehabilitation | Admitting: Physical Medicine & Rehabilitation

## 2013-07-16 DIAGNOSIS — S069XAS Unspecified intracranial injury with loss of consciousness status unknown, sequela: Secondary | ICD-10-CM

## 2013-07-16 DIAGNOSIS — W3400XA Accidental discharge from unspecified firearms or gun, initial encounter: Secondary | ICD-10-CM | POA: Insufficient documentation

## 2013-07-16 DIAGNOSIS — IMO0002 Reserved for concepts with insufficient information to code with codable children: Secondary | ICD-10-CM | POA: Insufficient documentation

## 2013-07-16 DIAGNOSIS — S069X9S Unspecified intracranial injury with loss of consciousness of unspecified duration, sequela: Secondary | ICD-10-CM | POA: Insufficient documentation

## 2013-07-16 DIAGNOSIS — W3400XS Accidental discharge from unspecified firearms or gun, sequela: Secondary | ICD-10-CM

## 2013-07-16 DIAGNOSIS — F4321 Adjustment disorder with depressed mood: Secondary | ICD-10-CM

## 2013-07-16 DIAGNOSIS — R569 Unspecified convulsions: Secondary | ICD-10-CM

## 2013-07-16 DIAGNOSIS — S0193XS Puncture wound without foreign body of unspecified part of head, sequela: Secondary | ICD-10-CM

## 2013-07-16 DIAGNOSIS — F329 Major depressive disorder, single episode, unspecified: Secondary | ICD-10-CM | POA: Insufficient documentation

## 2013-07-16 MED ORDER — CITALOPRAM HYDROBROMIDE 10 MG PO TABS: 10.0000 mg | ORAL_TABLET | Freq: Every day | ORAL | Status: DC

## 2013-07-16 NOTE — Patient Instructions (Addendum)
CALL ME WITH ANY PROBLEMS OR QUESTIONS (#[phone]).    HAPPY HOLIDAYS!!!!!  IF YOU HAVE TROUBLE GETTING INTO NEUROLOGY I WILL HELP YOU WITH THE KEPPRA LAB CHECKS.  JUST LET ME KNOW

## 2013-07-16 NOTE — Telephone Encounter (Signed)
Discussed recommendations by neurology to cancel surgery this week. He needs to have Keppra levels stabilized. We will re-schedule once cleared by neurology. He is taking his Keppra as ordered. He is disappointed but understands.
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
## 2013-07-19 ENCOUNTER — Inpatient Hospital Stay (HOSPITAL_COMMUNITY): Admission: RE | Admit: 2013-07-19 | Payer: 59 | Source: Ambulatory Visit | Admitting: General Surgery

## 2013-07-19 ENCOUNTER — Encounter (HOSPITAL_COMMUNITY): Admission: RE | Payer: Self-pay | Source: Ambulatory Visit

## 2013-07-19 SURGERY — CLOSURE, ILEOSTOMY
Anesthesia: General

## 2013-08-07 ENCOUNTER — Encounter: Payer: Self-pay | Admitting: Neurology

## 2013-08-07 ENCOUNTER — Ambulatory Visit (INDEPENDENT_AMBULATORY_CARE_PROVIDER_SITE_OTHER): Payer: 59 | Admitting: Neurology

## 2013-08-07 VITALS — BP 104/80 | HR 84 | Temp 97.7°F | Ht 72.0 in | Wt 203.2 lb

## 2013-08-07 DIAGNOSIS — R561 Post traumatic seizures: Secondary | ICD-10-CM

## 2013-08-07 MED ORDER — LEVETIRACETAM 500 MG PO TABS: 500.0000 mg | ORAL_TABLET | Freq: Two times a day (BID) | ORAL | Status: DC
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
## 2013-08-10 ENCOUNTER — Other Ambulatory Visit: Payer: Self-pay | Admitting: Internal Medicine

## 2013-08-20 ENCOUNTER — Emergency Department (HOSPITAL_BASED_OUTPATIENT_CLINIC_OR_DEPARTMENT_OTHER)
Admission: EM | Admit: 2013-08-20 | Discharge: 2013-08-20 | Disposition: A | Discharge status: Home / Self Care | Payer: 59 | Attending: Emergency Medicine | Admitting: Emergency Medicine

## 2013-08-20 ENCOUNTER — Encounter (HOSPITAL_BASED_OUTPATIENT_CLINIC_OR_DEPARTMENT_OTHER): Payer: Self-pay | Admitting: Emergency Medicine

## 2013-08-20 DIAGNOSIS — G40909 Epilepsy, unspecified, not intractable, without status epilepticus: Secondary | ICD-10-CM | POA: Insufficient documentation

## 2013-08-20 DIAGNOSIS — Z79899 Other long term (current) drug therapy: Secondary | ICD-10-CM | POA: Insufficient documentation

## 2013-08-20 DIAGNOSIS — F17200 Nicotine dependence, unspecified, uncomplicated: Secondary | ICD-10-CM | POA: Insufficient documentation

## 2013-08-20 DIAGNOSIS — F29 Unspecified psychosis not due to a substance or known physiological condition: Secondary | ICD-10-CM | POA: Insufficient documentation

## 2013-08-20 DIAGNOSIS — F3289 Other specified depressive episodes: Secondary | ICD-10-CM | POA: Insufficient documentation

## 2013-08-20 DIAGNOSIS — R569 Unspecified convulsions: Secondary | ICD-10-CM

## 2013-08-20 DIAGNOSIS — R404 Transient alteration of awareness: Secondary | ICD-10-CM | POA: Insufficient documentation

## 2013-08-20 DIAGNOSIS — F329 Major depressive disorder, single episode, unspecified: Secondary | ICD-10-CM | POA: Insufficient documentation

## 2013-08-20 DIAGNOSIS — R000 Tachycardia, unspecified: Secondary | ICD-10-CM | POA: Insufficient documentation

## 2013-08-20 DIAGNOSIS — Z87828 Personal history of other (healed) physical injury and trauma: Secondary | ICD-10-CM | POA: Insufficient documentation

## 2013-08-20 HISTORY — DX: Major depressive disorder, single episode, unspecified: F32.9

## 2013-08-20 HISTORY — DX: Depression, unspecified: F32.A

## 2013-08-20 LAB — BASIC METABOLIC PANEL
BUN: 9 mg/dL (ref 6–23)
CHLORIDE: 99 meq/L (ref 96–112)
CO2: 21 mEq/L (ref 19–32)
Calcium: 9.5 mg/dL (ref 8.4–10.5)
Creatinine, Ser: 1.2 mg/dL (ref 0.50–1.35)
GFR calc non Af Amer: 83 mL/min — ABNORMAL LOW (ref >90)
Glucose, Bld: 115 mg/dL — ABNORMAL HIGH (ref 70–99)
Potassium: 3.9 mEq/L (ref 3.7–5.3)
Sodium: 139 mEq/L (ref 137–147)

## 2013-08-20 MED ORDER — SODIUM CHLORIDE 0.9 % IV SOLN
500.0000 mg | Freq: Once | INTRAVENOUS | Status: AC
Start: 2013-08-20 — End: 2013-08-20
  Administered 2013-08-20: 500 mg via INTRAVENOUS

## 2013-08-20 MED ORDER — LORAZEPAM 2 MG/ML IJ SOLN
1.0000 mg | Freq: Once | INTRAMUSCULAR | Status: AC
  Administered 2013-08-20: 1 mg via INTRAVENOUS
  Filled 2013-08-20: qty 1

## 2013-08-20 MED ORDER — LEVETIRACETAM 500 MG/5ML IV SOLN
INTRAVENOUS | Status: AC
  Filled 2013-08-20: qty 5

## 2013-08-20 NOTE — Discharge Instructions (Signed)
Seizure, Adult A seizure is abnormal electrical activity in the brain. Seizures usually last from 30 seconds to 2 minutes. There are various types of seizures. Before a seizure, you may have a warning sensation (aura) that a seizure is about to occur. An aura may include the following symptoms:   Fear or anxiety.  Nausea.  Feeling like the room is spinning (vertigo).  Vision changes, such as seeing flashing lights or spots. Common symptoms during a seizure include:  A change in attention or behavior (altered mental status).  Convulsions with rhythmic jerking movements.  Drooling.  Rapid eye movements.  Grunting.  Loss of bladder and bowel control.  Bitter taste in the mouth.  Tongue biting. After a seizure, you may feel confused and sleepy. You may also have an injury resulting from convulsions during the seizure. HOME CARE INSTRUCTIONS   If you are given medicines, take them exactly as prescribed by your health care provider.  Keep all follow-up appointments as directed by your health care provider.  Do not swim or drive or engage in risky activity during which a seizure could cause further injury to you or others until your health care provider says it is OK.  Get adequate rest.  Teach friends and family what to do if you have a seizure. They should:  Lay you on the ground to prevent a fall.  Put a cushion under your head.  Loosen any tight clothing around your neck.  Turn you on your side. If vomiting occurs, this helps keep your airway clear.  Stay with you until you recover.  Know whether or not you need emergency care. SEEK IMMEDIATE MEDICAL CARE IF:  The seizure lasts longer than 5 minutes.  The seizure is severe or you do not wake up immediately after the seizure.  You have an altered mental status after the seizure.  You are having more frequent or worsening seizures. Someone should drive you to the emergency department or call local emergency  services (911 in [place] MAKE SURE YOU:  Understand these instructions.  Will watch your condition.  Will get help right away if you are not doing well or get worse. Document Released: 07/22/2000 Document Revised: 05/15/2013 Document Reviewed: 03/06/2013 [employer] Patient Information 2014 [employer].  
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
## 2013-08-27 ENCOUNTER — Encounter: Payer: Self-pay | Admitting: Physical Medicine & Rehabilitation

## 2013-08-27 ENCOUNTER — Encounter: Payer: 59 | Attending: Physical Medicine & Rehabilitation | Admitting: Physical Medicine & Rehabilitation

## 2013-08-27 VITALS — BP 148/86 | HR 70 | Resp 14 | Ht 72.0 in | Wt 208.0 lb

## 2013-08-27 DIAGNOSIS — IMO0002 Reserved for concepts with insufficient information to code with codable children: Secondary | ICD-10-CM

## 2013-08-27 DIAGNOSIS — R569 Unspecified convulsions: Secondary | ICD-10-CM | POA: Insufficient documentation

## 2013-08-27 DIAGNOSIS — W3400XA Accidental discharge from unspecified firearms or gun, initial encounter: Secondary | ICD-10-CM | POA: Insufficient documentation

## 2013-08-27 DIAGNOSIS — F4321 Adjustment disorder with depressed mood: Secondary | ICD-10-CM | POA: Insufficient documentation

## 2013-08-27 DIAGNOSIS — F329 Major depressive disorder, single episode, unspecified: Secondary | ICD-10-CM

## 2013-08-27 DIAGNOSIS — S06369A Traumatic hemorrhage of cerebrum, unspecified, with loss of consciousness of unspecified duration, initial encounter: Secondary | ICD-10-CM

## 2013-08-27 DIAGNOSIS — B029 Zoster without complications: Secondary | ICD-10-CM | POA: Insufficient documentation

## 2013-08-27 DIAGNOSIS — R561 Post traumatic seizures: Secondary | ICD-10-CM | POA: Insufficient documentation

## 2013-08-27 DIAGNOSIS — S0636AA Traumatic hemorrhage of cerebrum, unspecified, with loss of consciousness status unknown, initial encounter: Secondary | ICD-10-CM

## 2013-08-27 MED ORDER — LIDOCAINE 5 % EX PTCH: 1.0000 | MEDICATED_PATCH | CUTANEOUS | Status: DC

## 2013-08-27 MED ORDER — CITALOPRAM HYDROBROMIDE 20 MG PO TABS: 20.0000 mg | ORAL_TABLET | Freq: Every day | ORAL | Status: DC
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
## 2013-08-27 NOTE — Patient Instructions (Signed)
PLEASE CALL ME WITH ANY PROBLEMS OR QUESTIONS (#[phone]).      

## 2013-09-03 ENCOUNTER — Telehealth: Payer: Self-pay | Admitting: Physical Medicine & Rehabilitation

## 2013-09-03 NOTE — Telephone Encounter (Signed)
Sent over referral to Dr. Dahlia Client, patient declined visit with his office due to unmet deductible too high of a cost for him now.

## 2013-10-01 ENCOUNTER — Encounter (HOSPITAL_BASED_OUTPATIENT_CLINIC_OR_DEPARTMENT_OTHER): Payer: Self-pay | Admitting: Emergency Medicine

## 2013-10-01 ENCOUNTER — Emergency Department (HOSPITAL_BASED_OUTPATIENT_CLINIC_OR_DEPARTMENT_OTHER)
Admission: EM | Admit: 2013-10-01 | Discharge: 2013-10-01 | Disposition: A | Discharge status: Home / Self Care | Payer: 59 | Attending: Emergency Medicine | Admitting: Emergency Medicine

## 2013-10-01 DIAGNOSIS — Z87891 Personal history of nicotine dependence: Secondary | ICD-10-CM | POA: Insufficient documentation

## 2013-10-01 DIAGNOSIS — F3289 Other specified depressive episodes: Secondary | ICD-10-CM | POA: Insufficient documentation

## 2013-10-01 DIAGNOSIS — K5289 Other specified noninfective gastroenteritis and colitis: Secondary | ICD-10-CM | POA: Insufficient documentation

## 2013-10-01 DIAGNOSIS — E860 Dehydration: Secondary | ICD-10-CM | POA: Insufficient documentation

## 2013-10-01 DIAGNOSIS — Z79899 Other long term (current) drug therapy: Secondary | ICD-10-CM | POA: Insufficient documentation

## 2013-10-01 DIAGNOSIS — Z9889 Other specified postprocedural states: Secondary | ICD-10-CM | POA: Insufficient documentation

## 2013-10-01 DIAGNOSIS — F329 Major depressive disorder, single episode, unspecified: Secondary | ICD-10-CM | POA: Insufficient documentation

## 2013-10-01 DIAGNOSIS — G40909 Epilepsy, unspecified, not intractable, without status epilepticus: Secondary | ICD-10-CM | POA: Insufficient documentation

## 2013-10-01 DIAGNOSIS — K529 Noninfective gastroenteritis and colitis, unspecified: Secondary | ICD-10-CM

## 2013-10-01 LAB — BASIC METABOLIC PANEL
BUN: 12 mg/dL (ref 6–23)
CALCIUM: 10.7 mg/dL — AB (ref 8.4–10.5)
CHLORIDE: 94 meq/L — AB (ref 96–112)
CO2: 24 mEq/L (ref 19–32)
CREATININE: 1.5 mg/dL — AB (ref 0.50–1.35)
GFR calc Af Amer: 73 mL/min — ABNORMAL LOW (ref >90)
GFR calc non Af Amer: 63 mL/min — ABNORMAL LOW (ref >90)
GLUCOSE: 100 mg/dL — AB (ref 70–99)
Potassium: 4.3 mEq/L (ref 3.7–5.3)
Sodium: 136 mEq/L — ABNORMAL LOW (ref 137–147)

## 2013-10-01 LAB — CBC WITH DIFFERENTIAL/PLATELET
Basophils Absolute: 0 10*3/uL (ref 0.0–0.1)
Basophils Relative: 0 % (ref 0–1)
EOS ABS: 0 10*3/uL (ref 0.0–0.7)
Eosinophils Relative: 0 % (ref 0–5)
HEMATOCRIT: 52.5 % — AB (ref 39.0–52.0)
Hemoglobin: 18.1 g/dL — ABNORMAL HIGH (ref 13.0–17.0)
LYMPHS ABS: 0.6 10*3/uL — AB (ref 0.7–4.0)
LYMPHS PCT: 5 % — AB (ref 12–46)
MCH: 28.1 pg (ref 26.0–34.0)
MCHC: 34.5 g/dL (ref 30.0–36.0)
MCV: 81.6 fL (ref 78.0–100.0)
MONO ABS: 0.9 10*3/uL (ref 0.1–1.0)
Monocytes Relative: 8 % (ref 3–12)
Neutro Abs: 9.1 10*3/uL — ABNORMAL HIGH (ref 1.7–7.7)
Neutrophils Relative %: 86 % — ABNORMAL HIGH (ref 43–77)
Platelets: 285 10*3/uL (ref 150–400)
RBC: 6.43 MIL/uL — ABNORMAL HIGH (ref 4.22–5.81)
RDW: 13.7 % (ref 11.5–15.5)
WBC: 10.6 10*3/uL — AB (ref 4.0–10.5)

## 2013-10-01 MED ORDER — LOPERAMIDE HCL 2 MG PO TABS: 2.0000 mg | ORAL_TABLET | Freq: Four times a day (QID) | ORAL | Status: DC | PRN

## 2013-10-01 MED ORDER — SODIUM CHLORIDE 0.9 % IV BOLUS (SEPSIS)
1000.0000 mL | Freq: Once | INTRAVENOUS | Status: AC
  Administered 2013-10-01: 1000 mL via INTRAVENOUS

## 2013-10-01 MED ORDER — SODIUM CHLORIDE 0.9 % IV SOLN: INTRAVENOUS | Status: DC

## 2013-10-01 MED ORDER — ONDANSETRON 4 MG PO TBDP: 4.0000 mg | ORAL_TABLET | Freq: Three times a day (TID) | ORAL | Status: DC | PRN

## 2013-10-01 MED ORDER — ONDANSETRON HCL 4 MG/2ML IJ SOLN
4.0000 mg | Freq: Once | INTRAMUSCULAR | Status: AC
  Administered 2013-10-01: 4 mg via INTRAVENOUS
  Filled 2013-10-01: qty 2
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
## 2013-10-01 NOTE — Discharge Instructions (Signed)
Today's lab workup showed a degree of dehydration. Work on Arboriculturist. If not improved in the next one to 2 days return. Take the Zofran as needed for nausea and vomiting. Use Imodium right ear for the diarrhea. Would recommend pushing fluids and bland diet.

## 2013-10-01 NOTE — ED Notes (Signed)
MD at bedside discussing test results and plan of care. 

## 2013-10-01 NOTE — ED Notes (Signed)
Patient states he began to feel bad last night at 2200 with discomfort in his abdomen.  States through the morning he had vomited x 2 which is associated with mild stomach cramping and an increase in watery stool from his colostomy.

## 2013-10-07 ENCOUNTER — Ambulatory Visit: Payer: No Typology Code available for payment source | Admitting: Neurology

## 2013-10-09 ENCOUNTER — Ambulatory Visit (INDEPENDENT_AMBULATORY_CARE_PROVIDER_SITE_OTHER): Payer: Managed Care, Other (non HMO) | Admitting: Neurology

## 2013-10-09 ENCOUNTER — Encounter: Payer: Self-pay | Admitting: Neurology

## 2013-10-09 VITALS — BP 128/80 | HR 68 | Temp 98.4°F | Resp 18 | Wt 202.5 lb

## 2013-10-09 DIAGNOSIS — G40109 Localization-related (focal) (partial) symptomatic epilepsy and epileptic syndromes with simple partial seizures, not intractable, without status epilepticus: Secondary | ICD-10-CM | POA: Insufficient documentation

## 2013-10-09 MED ORDER — LEVETIRACETAM 750 MG PO TABS: 750.0000 mg | ORAL_TABLET | Freq: Two times a day (BID) | ORAL | Status: DC
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
## 2013-10-09 NOTE — Patient Instructions (Signed)
1.  Change Keppra to 750mg  tablets.  Take 1 tablet twice daily.  In the meantime, you can finish the 500mg  tablets by taking 1 and a half tablets twice daily. 2.  No driving 3.  Okay for surgery. 4.  Follow up in 3 months.

## 2013-10-29 ENCOUNTER — Telehealth: Payer: Self-pay | Admitting: Neurology

## 2013-10-29 NOTE — Telephone Encounter (Signed)
I  Spoke with patient to let him know that per Dr Tomi Likens at a neurologic stand point he is cleared for  Surgery this was faxed to Dr Grandville Silos at [facility]  On 10/09/13 and again today 10/29/13

## 2013-10-29 NOTE — Telephone Encounter (Signed)
Pt has returned your call / Sherri

## 2013-10-29 NOTE — Telephone Encounter (Signed)
[phone], pt calling with questions regarding surgical clearance. Please call / Sherri S.

## 2013-10-29 NOTE — Telephone Encounter (Signed)
Corrected phone number is [phone] / Sherri S.

## 2013-10-30 ENCOUNTER — Telehealth (INDEPENDENT_AMBULATORY_CARE_PROVIDER_SITE_OTHER): Payer: Self-pay

## 2013-10-30 NOTE — Telephone Encounter (Signed)
We received clearance from neurology today on pt. It is in epic in the 10-09-13 note by Metta Clines, DO. Will send request for Dr Grandville Silos to review and advise if he needs to see pt in office or put orders and scheduling sheet in epic to proceed with surgery scheduling.

## 2013-10-30 NOTE — Telephone Encounter (Signed)
I need to see him prior to scheduling again. Can you add him next Wednesday? Thx

## 2013-10-30 NOTE — Telephone Encounter (Signed)
Per Dr Biagio Borg requestI called pt and set him up for appt next Weds.

## 2013-11-06 ENCOUNTER — Encounter (INDEPENDENT_AMBULATORY_CARE_PROVIDER_SITE_OTHER): Payer: Self-pay | Admitting: General Surgery

## 2013-11-06 ENCOUNTER — Ambulatory Visit (INDEPENDENT_AMBULATORY_CARE_PROVIDER_SITE_OTHER): Payer: 59 | Admitting: General Surgery

## 2013-11-06 VITALS — BP 142/86 | HR 78 | Temp 98.7°F | Resp 14 | Ht 72.0 in | Wt 200.4 lb

## 2013-11-06 DIAGNOSIS — Z932 Ileostomy status: Secondary | ICD-10-CM | POA: Insufficient documentation
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
## 2013-11-19 ENCOUNTER — Encounter: Payer: 59 | Attending: Physical Medicine & Rehabilitation | Admitting: Physical Medicine & Rehabilitation

## 2013-11-19 ENCOUNTER — Encounter: Payer: Self-pay | Admitting: Physical Medicine & Rehabilitation

## 2013-11-19 VITALS — BP 141/85 | HR 80 | Resp 14 | Ht 72.0 in | Wt 204.0 lb

## 2013-11-19 DIAGNOSIS — R569 Unspecified convulsions: Secondary | ICD-10-CM | POA: Insufficient documentation

## 2013-11-19 DIAGNOSIS — K631 Perforation of intestine (nontraumatic): Secondary | ICD-10-CM

## 2013-11-19 DIAGNOSIS — B029 Zoster without complications: Secondary | ICD-10-CM | POA: Insufficient documentation

## 2013-11-19 DIAGNOSIS — IMO0002 Reserved for concepts with insufficient information to code with codable children: Secondary | ICD-10-CM

## 2013-11-19 DIAGNOSIS — W3400XA Accidental discharge from unspecified firearms or gun, initial encounter: Secondary | ICD-10-CM | POA: Insufficient documentation

## 2013-11-19 DIAGNOSIS — R561 Post traumatic seizures: Secondary | ICD-10-CM

## 2013-11-19 DIAGNOSIS — F329 Major depressive disorder, single episode, unspecified: Secondary | ICD-10-CM

## 2013-11-19 DIAGNOSIS — S0636AA Traumatic hemorrhage of cerebrum, unspecified, with loss of consciousness status unknown, initial encounter: Secondary | ICD-10-CM

## 2013-11-19 DIAGNOSIS — S06369A Traumatic hemorrhage of cerebrum, unspecified, with loss of consciousness of unspecified duration, initial encounter: Secondary | ICD-10-CM

## 2013-11-19 DIAGNOSIS — F4321 Adjustment disorder with depressed mood: Secondary | ICD-10-CM
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
## 2013-11-19 NOTE — Patient Instructions (Signed)
PAIN RELIEF:  HEATING PAD OR HOT PACK ICE TYLENOL: 500MG  EVERY 4-6 HOURS AS NEEDED IBUPROFEN 200-400MG  EVERY 6 HOURS AS NEEDED  MAKE SURE YOU GET ADEQUATE SLEEP AND HAVE CONTROL OF YOUR ANXIETY.

## 2013-12-11 ENCOUNTER — Encounter (HOSPITAL_COMMUNITY): Payer: Self-pay | Admitting: Pharmacy Technician

## 2013-12-12 ENCOUNTER — Encounter (HOSPITAL_COMMUNITY): Payer: Self-pay

## 2013-12-12 NOTE — Pre-Procedure Instructions (Signed)
[facility] Dakota Evans  12/12/2013   Your procedure is scheduled on:  Mon, May 18 @ 7:30 AM  Report to [facility]  at 5:30 AM.  Call this number if you have problems the morning of surgery: [phone]   Remember:   Do not eat food or drink liquids after midnight.   Take these medicines the morning of surgery with A SIP OF WATER: Citalopram(Celexa) and Keppra(Levetiracetam)              No Goody's,BC's,Aleve,Aspirin,Ibuprofen,Fish Oil,or any Herbal Medications   Do not wear jewelry  Do not wear lotions, powders, or colognes. You may wear deodorant.  Men may shave face and neck.  Do not bring valuables to the hospital.  [facility] is not responsible                  for any belongings or valuables.               Contacts, dentures or bridgework may not be worn into surgery.  Leave suitcase in the car. After surgery it may be brought to your room.  For patients admitted to the hospital, discharge time is determined by your                treatment team.               Patients discharged the day of surgery will not be allowed to drive  home.    Special Instructions:  [name] - Preparing for Surgery  Before surgery, you can play an important role.  Because skin is not sterile, your skin needs to be as free of germs as possible.  You can reduce the number of germs on you skin by washing with CHG (chlorahexidine gluconate) soap before surgery.  CHG is an antiseptic cleaner which kills germs and bonds with the skin to continue killing germs even after washing.  Please DO NOT use if you have an allergy to CHG or antibacterial soaps.  If your skin becomes reddened/irritated stop using the CHG and inform your nurse when you arrive at Short Stay.  Do not shave (including legs and underarms) for at least 48 hours prior to the first CHG shower.  You may shave your face.  Please follow these instructions carefully:   1.  Shower with CHG Soap the night before surgery and the                                 morning of Surgery.  2.  If you choose to wash your hair, wash your hair first as usual with your       normal shampoo.  3.  After you shampoo, rinse your hair and body thoroughly to remove the                      Shampoo.  4.  Use CHG as you would any other liquid soap.  You can apply chg directly       to the skin and wash gently with scrungie or a clean washcloth.  5.  Apply the CHG Soap to your body ONLY FROM THE NECK DOWN.        Do not use on open wounds or open sores.  Avoid contact with your eyes,       ears, mouth and genitals (private parts).  Wash genitals (private parts)  with your normal soap.  6.  Wash thoroughly, paying special attention to the area where your surgery        will be performed.  7.  Thoroughly rinse your body with warm water from the neck down.  8.  DO NOT shower/wash with your normal soap after using and rinsing off       the CHG Soap.  9.  Pat yourself dry with a clean towel.            10.  Wear clean pajamas.            11.  Place clean sheets on your bed the night of your first shower and do not        sleep with pets.  Day of Surgery  Do not apply any lotions/deoderants the morning of surgery.  Please wear clean clothes to the hospital/surgery center.     Please read over the following fact sheets that you were given: Pain Booklet, Coughing and Deep Breathing and Surgical Site Infection Prevention

## 2013-12-13 ENCOUNTER — Encounter (HOSPITAL_COMMUNITY)
Admission: RE | Admit: 2013-12-13 | Discharge: 2013-12-13 | Disposition: A | Discharge status: Home / Self Care | Payer: 59 | Source: Ambulatory Visit | Attending: General Surgery | Admitting: General Surgery

## 2013-12-13 ENCOUNTER — Encounter (HOSPITAL_COMMUNITY): Payer: Self-pay

## 2013-12-13 DIAGNOSIS — Z01812 Encounter for preprocedural laboratory examination: Secondary | ICD-10-CM | POA: Insufficient documentation

## 2013-12-13 HISTORY — DX: Personal history of other diseases of the nervous system and sense organs: Z86.69

## 2013-12-13 HISTORY — DX: Personal history of other infectious and parasitic diseases: Z86.19

## 2013-12-13 HISTORY — DX: Personal history of other medical treatment: Z92.89

## 2013-12-13 LAB — CBC
HEMATOCRIT: 46.2 % (ref 39.0–52.0)
HEMOGLOBIN: 16.1 g/dL (ref 13.0–17.0)
MCH: 28.6 pg (ref 26.0–34.0)
MCHC: 34.8 g/dL (ref 30.0–36.0)
MCV: 82.1 fL (ref 78.0–100.0)
Platelets: 244 10*3/uL (ref 150–400)
RBC: 5.63 MIL/uL (ref 4.22–5.81)
RDW: 12.8 % (ref 11.5–15.5)
WBC: 6 10*3/uL (ref 4.0–10.5)

## 2013-12-13 LAB — BASIC METABOLIC PANEL
BUN: 9 mg/dL (ref 6–23)
CHLORIDE: 102 meq/L (ref 96–112)
CO2: 26 meq/L (ref 19–32)
Calcium: 9.9 mg/dL (ref 8.4–10.5)
Creatinine, Ser: 1.18 mg/dL (ref 0.50–1.35)
GFR calc Af Amer: >90 mL/min (ref >90)
GFR calc non Af Amer: 85 mL/min — ABNORMAL LOW (ref >90)
GLUCOSE: 102 mg/dL — AB (ref 70–99)
POTASSIUM: 4.5 meq/L (ref 3.7–5.3)
SODIUM: 141 meq/L (ref 137–147)

## 2013-12-13 NOTE — Progress Notes (Signed)
12/13/13 0927  OBSTRUCTIVE SLEEP APNEA  Have you ever been diagnosed with sleep apnea through a sleep study? No  Do you snore loudly (loud enough to be heard through closed doors)?  1  Do you often feel tired, fatigued, or sleepy during the daytime? 0  Has anyone observed you stop breathing during your sleep? 1  Do you have, or are you being treated for high blood pressure? 1  BMI more than 35 kg/m2? 0  Age over 26 years old? 0  Neck circumference greater than 40 cm/16 inches? 0  Gender: 1  Obstructive Sleep Apnea Score 4  Score 4 or greater  Results sent to PCP

## 2013-12-13 NOTE — Progress Notes (Addendum)
Pt doesn't have a cardiologist  Denies ever having a heart cath/echo/stress test   EKG in epic from 01-24-13  Medical Md is Dr.Bruce Swords

## 2013-12-22 MED ORDER — SODIUM CHLORIDE 0.9 % IV SOLN
1.0000 g | INTRAVENOUS | Status: DC
  Filled 2013-12-22: qty 1

## 2013-12-22 MED ORDER — ALVIMOPAN 12 MG PO CAPS
12.0000 mg | ORAL_CAPSULE | Freq: Once | ORAL | Status: AC
  Administered 2013-12-23: 12 mg via ORAL
  Filled 2013-12-22: qty 1

## 2013-12-23 ENCOUNTER — Encounter (HOSPITAL_COMMUNITY): Admission: RE | Disposition: A | Discharge status: Home / Self Care | Payer: Self-pay | Source: Ambulatory Visit

## 2013-12-23 ENCOUNTER — Inpatient Hospital Stay (HOSPITAL_COMMUNITY): Payer: Managed Care, Other (non HMO) | Admitting: Anesthesiology

## 2013-12-23 ENCOUNTER — Inpatient Hospital Stay (HOSPITAL_COMMUNITY)
Admission: RE | Admit: 2013-12-23 | Discharge: 2013-12-27 | DRG: 346 | Disposition: A | Discharge status: Home / Self Care | Payer: Managed Care, Other (non HMO) | Source: Ambulatory Visit | Attending: Surgery | Admitting: Surgery

## 2013-12-23 ENCOUNTER — Encounter (HOSPITAL_COMMUNITY): Payer: Self-pay | Admitting: Anesthesiology

## 2013-12-23 ENCOUNTER — Encounter (HOSPITAL_COMMUNITY): Payer: Managed Care, Other (non HMO) | Admitting: Anesthesiology

## 2013-12-23 DIAGNOSIS — Z432 Encounter for attention to ileostomy: Secondary | ICD-10-CM

## 2013-12-23 DIAGNOSIS — E876 Hypokalemia: Secondary | ICD-10-CM | POA: Diagnosis not present

## 2013-12-23 DIAGNOSIS — Z833 Family history of diabetes mellitus: Secondary | ICD-10-CM

## 2013-12-23 DIAGNOSIS — R569 Unspecified convulsions: Secondary | ICD-10-CM | POA: Diagnosis present

## 2013-12-23 DIAGNOSIS — Z87891 Personal history of nicotine dependence: Secondary | ICD-10-CM | POA: Diagnosis not present

## 2013-12-23 DIAGNOSIS — Z9889 Other specified postprocedural states: Secondary | ICD-10-CM

## 2013-12-23 HISTORY — PX: ILEOSTOMY CLOSURE: SHX1784

## 2013-12-23 HISTORY — PX: COLON SURGERY: SHX602

## 2013-12-23 SURGERY — CLOSURE, ILEOSTOMY
Anesthesia: General | Site: Abdomen

## 2013-12-23 MED ORDER — SODIUM CHLORIDE 0.9 % IV SOLN
1.0000 g | INTRAVENOUS | Status: DC | PRN
  Administered 2013-12-23: 1 g via INTRAVENOUS

## 2013-12-23 MED ORDER — ALVIMOPAN 12 MG PO CAPS
12.0000 mg | ORAL_CAPSULE | Freq: Two times a day (BID) | ORAL | Status: DC
  Administered 2013-12-24 (×2): 12 mg via ORAL
  Filled 2013-12-23 (×4): qty 1

## 2013-12-23 MED ORDER — PNEUMOCOCCAL VAC POLYVALENT 25 MCG/0.5ML IJ INJ
0.5000 mL | INJECTION | INTRAMUSCULAR | Status: AC
  Administered 2013-12-24: 0.5 mL via INTRAMUSCULAR
  Filled 2013-12-23: qty 0.5

## 2013-12-23 MED ORDER — ONDANSETRON HCL 4 MG/2ML IJ SOLN
INTRAMUSCULAR | Status: AC
  Filled 2013-12-23: qty 2

## 2013-12-23 MED ORDER — PROPOFOL 10 MG/ML IV BOLUS
INTRAVENOUS | Status: AC
  Filled 2013-12-23: qty 20

## 2013-12-23 MED ORDER — WHITE PETROLATUM GEL
Status: AC
  Administered 2013-12-23: 16:00:00
  Filled 2013-12-23: qty 5

## 2013-12-23 MED ORDER — LIDOCAINE HCL (CARDIAC) 20 MG/ML IV SOLN
INTRAVENOUS | Status: AC
  Filled 2013-12-23: qty 5

## 2013-12-23 MED ORDER — FENTANYL CITRATE 0.05 MG/ML IJ SOLN
INTRAMUSCULAR | Status: AC
  Filled 2013-12-23: qty 5

## 2013-12-23 MED ORDER — HYDROMORPHONE 0.3 MG/ML IV SOLN
INTRAVENOUS | Status: AC
  Filled 2013-12-23: qty 25

## 2013-12-23 MED ORDER — DIPHENHYDRAMINE HCL 12.5 MG/5ML PO ELIX: 12.5000 mg | ORAL_SOLUTION | Freq: Four times a day (QID) | ORAL | Status: DC | PRN

## 2013-12-23 MED ORDER — SODIUM CHLORIDE 0.9 % IV SOLN
750.0000 mg | Freq: Two times a day (BID) | INTRAVENOUS | Status: DC
  Administered 2013-12-23 – 2013-12-25 (×4): 750 mg via INTRAVENOUS
  Filled 2013-12-23 (×5): qty 7.5

## 2013-12-23 MED ORDER — 0.9 % SODIUM CHLORIDE (POUR BTL) OPTIME
TOPICAL | Status: DC | PRN
  Administered 2013-12-23 (×3): 1000 mL

## 2013-12-23 MED ORDER — HYDROMORPHONE HCL PF 1 MG/ML IJ SOLN
INTRAMUSCULAR | Status: AC
  Filled 2013-12-23: qty 1

## 2013-12-23 MED ORDER — GLYCOPYRROLATE 0.2 MG/ML IJ SOLN
INTRAMUSCULAR | Status: AC
  Filled 2013-12-23: qty 2

## 2013-12-23 MED ORDER — FENTANYL CITRATE 0.05 MG/ML IJ SOLN
INTRAMUSCULAR | Status: DC | PRN
  Administered 2013-12-23: 100 ug via INTRAVENOUS
  Administered 2013-12-23: 50 ug via INTRAVENOUS
  Administered 2013-12-23: 100 ug via INTRAVENOUS
  Administered 2013-12-23 (×2): 50 ug via INTRAVENOUS

## 2013-12-23 MED ORDER — DIPHENHYDRAMINE HCL 50 MG/ML IJ SOLN
12.5000 mg | Freq: Four times a day (QID) | INTRAMUSCULAR | Status: DC | PRN
Start: 2013-12-23 — End: 2013-12-25

## 2013-12-23 MED ORDER — ROCURONIUM BROMIDE 100 MG/10ML IV SOLN
INTRAVENOUS | Status: DC | PRN
  Administered 2013-12-23: 50 mg via INTRAVENOUS
  Administered 2013-12-23: 30 mg via INTRAVENOUS

## 2013-12-23 MED ORDER — SODIUM CHLORIDE 0.9 % IJ SOLN: 9.0000 mL | INTRAMUSCULAR | Status: DC | PRN

## 2013-12-23 MED ORDER — OXYCODONE HCL 5 MG PO TABS: 5.0000 mg | ORAL_TABLET | Freq: Once | ORAL | Status: DC | PRN

## 2013-12-23 MED ORDER — MIDAZOLAM HCL 2 MG/2ML IJ SOLN
INTRAMUSCULAR | Status: AC
  Filled 2013-12-23: qty 2

## 2013-12-23 MED ORDER — HYDROMORPHONE 0.3 MG/ML IV SOLN
INTRAVENOUS | Status: DC
  Administered 2013-12-23: 0.3 mg via INTRAVENOUS
  Administered 2013-12-23: 1.8 mg via INTRAVENOUS
  Administered 2013-12-23: 17:00:00 via INTRAVENOUS
  Administered 2013-12-23: 3.34 mg via INTRAVENOUS
  Administered 2013-12-24: 3.78 mg via INTRAVENOUS
  Administered 2013-12-24: 3.2 mg via INTRAVENOUS
  Administered 2013-12-24: 10:00:00 via INTRAVENOUS
  Administered 2013-12-24: 3.6 mg via INTRAVENOUS
  Administered 2013-12-24: 4.32 mg via INTRAVENOUS
  Administered 2013-12-24: 1.2 mg via INTRAVENOUS
  Administered 2013-12-24: 0.9 mg via INTRAVENOUS
  Administered 2013-12-25: 01:00:00 via INTRAVENOUS
  Administered 2013-12-25: 1.8 mg via INTRAVENOUS
  Filled 2013-12-23 (×4): qty 25

## 2013-12-23 MED ORDER — MEPERIDINE HCL 25 MG/ML IJ SOLN: 6.2500 mg | INTRAMUSCULAR | Status: DC | PRN

## 2013-12-23 MED ORDER — HYDROMORPHONE HCL PF 1 MG/ML IJ SOLN
0.2500 mg | INTRAMUSCULAR | Status: DC | PRN
  Administered 2013-12-23 (×4): 0.5 mg via INTRAVENOUS

## 2013-12-23 MED ORDER — OXYCODONE HCL 5 MG/5ML PO SOLN: 5.0000 mg | Freq: Once | ORAL | Status: DC | PRN

## 2013-12-23 MED ORDER — NEOSTIGMINE METHYLSULFATE 10 MG/10ML IV SOLN
INTRAVENOUS | Status: DC | PRN
  Administered 2013-12-23: 5 mg via INTRAVENOUS

## 2013-12-23 MED ORDER — GLYCOPYRROLATE 0.2 MG/ML IJ SOLN
INTRAMUSCULAR | Status: DC | PRN
  Administered 2013-12-23: .8 mg via INTRAVENOUS

## 2013-12-23 MED ORDER — PROPOFOL 10 MG/ML IV BOLUS
INTRAVENOUS | Status: DC | PRN
  Administered 2013-12-23: 50 mg via INTRAVENOUS
  Administered 2013-12-23: 150 mg via INTRAVENOUS

## 2013-12-23 MED ORDER — ENOXAPARIN SODIUM 40 MG/0.4ML ~~LOC~~ SOLN
40.0000 mg | SUBCUTANEOUS | Status: DC
  Administered 2013-12-25 – 2013-12-26 (×2): 40 mg via SUBCUTANEOUS
  Filled 2013-12-23 (×4): qty 0.4

## 2013-12-23 MED ORDER — NALOXONE HCL 0.4 MG/ML IJ SOLN: 0.4000 mg | INTRAMUSCULAR | Status: DC | PRN

## 2013-12-23 MED ORDER — LIDOCAINE HCL (CARDIAC) 20 MG/ML IV SOLN
INTRAVENOUS | Status: DC | PRN
  Administered 2013-12-23: 100 mg via INTRAVENOUS

## 2013-12-23 MED ORDER — ROCURONIUM BROMIDE 50 MG/5ML IV SOLN
INTRAVENOUS | Status: AC
  Filled 2013-12-23: qty 1

## 2013-12-23 MED ORDER — ONDANSETRON HCL 4 MG/2ML IJ SOLN
4.0000 mg | Freq: Four times a day (QID) | INTRAMUSCULAR | Status: DC | PRN
  Administered 2013-12-24: 4 mg via INTRAVENOUS
  Filled 2013-12-23: qty 2

## 2013-12-23 MED ORDER — KCL IN DEXTROSE-NACL 20-5-0.45 MEQ/L-%-% IV SOLN
INTRAVENOUS | Status: DC
  Administered 2013-12-23 – 2013-12-25 (×4): via INTRAVENOUS
  Filled 2013-12-23 (×11): qty 1000

## 2013-12-23 MED ORDER — LACTATED RINGERS IV SOLN
INTRAVENOUS | Status: DC | PRN
  Administered 2013-12-23 (×2): via INTRAVENOUS

## 2013-12-23 MED ORDER — ONDANSETRON HCL 4 MG/2ML IJ SOLN: 4.0000 mg | Freq: Once | INTRAMUSCULAR | Status: DC | PRN

## 2013-12-23 MED ORDER — MIDAZOLAM HCL 5 MG/5ML IJ SOLN
INTRAMUSCULAR | Status: DC | PRN
  Administered 2013-12-23: 2 mg via INTRAVENOUS

## 2013-12-23 MED ORDER — NEOSTIGMINE METHYLSULFATE 10 MG/10ML IV SOLN
INTRAVENOUS | Status: AC
  Filled 2013-12-23: qty 1

## 2013-12-23 MED ORDER — ONDANSETRON HCL 4 MG/2ML IJ SOLN
INTRAMUSCULAR | Status: DC | PRN
  Administered 2013-12-23: 4 mg via INTRAVENOUS

## 2013-12-23 SURGICAL SUPPLY — 51 items
BLADE SURG ROTATE 9660 (MISCELLANEOUS) IMPLANT
CANISTER SUCTION 2500CC (MISCELLANEOUS) ×2 IMPLANT
CHLORAPREP W/TINT 26ML (MISCELLANEOUS) ×2 IMPLANT
COVER SURGICAL LIGHT HANDLE (MISCELLANEOUS) ×2 IMPLANT
DRAPE LAPAROSCOPIC ABDOMINAL (DRAPES) ×2 IMPLANT
DRAPE PROXIMA HALF (DRAPES) IMPLANT
DRAPE UTILITY 15X26 W/TAPE STR (DRAPE) ×4 IMPLANT
DRAPE WARM FLUID 44X44 (DRAPE) ×2 IMPLANT
DRSG COVADERM 4X10 (GAUZE/BANDAGES/DRESSINGS) ×2 IMPLANT
DRSG COVADERM 4X6 (GAUZE/BANDAGES/DRESSINGS) ×2 IMPLANT
ELECT BLADE 6.5 EXT (BLADE) ×2 IMPLANT
ELECT CAUTERY BLADE 6.4 (BLADE) ×4 IMPLANT
ELECT REM PT RETURN 9FT ADLT (ELECTROSURGICAL) ×2
ELECTRODE REM PT RTRN 9FT ADLT (ELECTROSURGICAL) ×1 IMPLANT
GLOVE BIO SURGEON STRL SZ8 (GLOVE) ×8 IMPLANT
GLOVE BIOGEL PI IND STRL 6 (GLOVE) ×1 IMPLANT
GLOVE BIOGEL PI IND STRL 7.0 (GLOVE) ×3 IMPLANT
GLOVE BIOGEL PI IND STRL 8 (GLOVE) ×1 IMPLANT
GLOVE BIOGEL PI INDICATOR 6 (GLOVE) ×1
GLOVE BIOGEL PI INDICATOR 7.0 (GLOVE) ×3
GLOVE BIOGEL PI INDICATOR 8 (GLOVE) ×1
GLOVE SURG SS PI 7.0 STRL IVOR (GLOVE) ×4 IMPLANT
GOWN STRL REUS W/ TWL LRG LVL3 (GOWN DISPOSABLE) ×2 IMPLANT
GOWN STRL REUS W/ TWL XL LVL3 (GOWN DISPOSABLE) ×2 IMPLANT
GOWN STRL REUS W/TWL LRG LVL3 (GOWN DISPOSABLE) ×2
GOWN STRL REUS W/TWL XL LVL3 (GOWN DISPOSABLE) ×2
KIT BASIN OR (CUSTOM PROCEDURE TRAY) ×2 IMPLANT
KIT ROOM TURNOVER OR (KITS) ×2 IMPLANT
LIGASURE IMPACT 36 18CM CVD LR (INSTRUMENTS) IMPLANT
NS IRRIG 1000ML POUR BTL (IV SOLUTION) ×6 IMPLANT
PACK GENERAL/GYN (CUSTOM PROCEDURE TRAY) ×2 IMPLANT
PAD ARMBOARD 7.5X6 YLW CONV (MISCELLANEOUS) ×2 IMPLANT
PENCIL BUTTON HOLSTER BLD 10FT (ELECTRODE) ×2 IMPLANT
RELOAD PROXIMATE 75MM BLUE (ENDOMECHANICALS) ×4 IMPLANT
SPECIMEN JAR MEDIUM (MISCELLANEOUS) IMPLANT
SPONGE GAUZE 4X4 12PLY (GAUZE/BANDAGES/DRESSINGS) IMPLANT
SPONGE LAP 18X18 X RAY DECT (DISPOSABLE) ×6 IMPLANT
STAPLER GUN LINEAR PROX 60 (STAPLE) ×2 IMPLANT
STAPLER PROXIMATE 75MM BLUE (STAPLE) ×2 IMPLANT
STAPLER VISISTAT 35W (STAPLE) ×2 IMPLANT
SUCTION POOLE TIP (SUCTIONS) ×4 IMPLANT
SUT PDS AB 1 TP1 54 (SUTURE) ×4 IMPLANT
SUT PDS AB 1 TP1 96 (SUTURE) ×4 IMPLANT
SUT SILK 2 0 SH CR/8 (SUTURE) ×2 IMPLANT
SUT SILK 2 0 TIES 10X30 (SUTURE) ×2 IMPLANT
SUT SILK 3 0 SH CR/8 (SUTURE) ×2 IMPLANT
SUT SILK 3 0 TIES 10X30 (SUTURE) ×2 IMPLANT
TOWEL OR 17X24 6PK STRL BLUE (TOWEL DISPOSABLE) ×2 IMPLANT
TOWEL OR 17X26 10 PK STRL BLUE (TOWEL DISPOSABLE) ×2 IMPLANT
TRAY FOLEY CATH 14FRSI W/METER (CATHETERS) ×2 IMPLANT
YANKAUER SUCT BULB TIP NO VENT (SUCTIONS) ×2 IMPLANT

## 2013-12-23 NOTE — Transfer of Care (Signed)
Immediate Anesthesia Transfer of Care Note  Patient: [employer]  Procedure(s) Performed: Procedure(s): ILEOSTOMY TAKEDOWN (N/A)  Patient Location: PACU  Anesthesia Type:General  Level of Consciousness: awake and alert   Airway & Oxygen Therapy: Patient Spontanous Breathing and Patient connected to nasal cannula oxygen  Post-op Assessment: Report given to PACU RN and Post -op Vital signs reviewed and stable  Post vital signs: Reviewed and stable  Complications: No apparent anesthesia complications
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
## 2013-12-23 NOTE — Anesthesia Preprocedure Evaluation (Signed)
Anesthesia Evaluation  Patient identified by MRN, date of birth, ID band Patient awake    Reviewed: Allergy & Precautions, H&P , NPO status , Patient's Chart, lab work & pertinent test results  Airway Mallampati: I TM Distance: >3 FB Neck ROM: Full    Dental   Pulmonary former smoker,          Cardiovascular hypertension, Pt. on medications     Neuro/Psych    GI/Hepatic   Endo/Other    Renal/GU      Musculoskeletal   Abdominal   Peds  Hematology   Anesthesia Other Findings   Reproductive/Obstetrics                           Anesthesia Physical Anesthesia Plan  ASA: II  Anesthesia Plan: General   Post-op Pain Management:    Induction: Intravenous  Airway Management Planned: Oral ETT  Additional Equipment:   Intra-op Plan:   Post-operative Plan: Extubation in OR  Informed Consent: I have reviewed the patients History and Physical, chart, labs and discussed the procedure including the risks, benefits and alternatives for the proposed anesthesia with the patient or authorized representative who has indicated his/her understanding and acceptance.     Plan Discussed with: CRNA and Surgeon  Anesthesia Plan Comments:         Anesthesia Quick Evaluation  
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
## 2013-12-23 NOTE — Anesthesia Postprocedure Evaluation (Signed)
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
## 2013-12-24 ENCOUNTER — Encounter (HOSPITAL_COMMUNITY): Payer: Self-pay | Admitting: General Surgery

## 2013-12-24 LAB — BASIC METABOLIC PANEL
BUN: 5 mg/dL — AB (ref 6–23)
CO2: 30 mEq/L (ref 19–32)
Calcium: 8.5 mg/dL (ref 8.4–10.5)
Chloride: 104 mEq/L (ref 96–112)
Creatinine, Ser: 1.07 mg/dL (ref 0.50–1.35)
Glucose, Bld: 116 mg/dL — ABNORMAL HIGH (ref 70–99)
POTASSIUM: 4.2 meq/L (ref 3.7–5.3)
SODIUM: 141 meq/L (ref 137–147)

## 2013-12-24 LAB — CBC
HCT: 38.8 % — ABNORMAL LOW (ref 39.0–52.0)
Hemoglobin: 13.1 g/dL (ref 13.0–17.0)
MCH: 27.9 pg (ref 26.0–34.0)
MCHC: 33.8 g/dL (ref 30.0–36.0)
MCV: 82.6 fL (ref 78.0–100.0)
Platelets: 225 10*3/uL (ref 150–400)
RBC: 4.7 MIL/uL (ref 4.22–5.81)
RDW: 13 % (ref 11.5–15.5)
WBC: 10.6 10*3/uL — ABNORMAL HIGH (ref 4.0–10.5)

## 2013-12-24 NOTE — Progress Notes (Signed)
Patient assisted to BR. One bloody BM per rectum. Moderate size. Able to void small amount according to patient.Will continue to monitor. M.D. notified
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
## 2013-12-24 NOTE — Progress Notes (Signed)
UR completed.  Sukari Grist, RN BSN MHA CCM Trauma/Neuro ICU Case Manager [phone]  

## 2013-12-24 NOTE — Progress Notes (Signed)
Some BS but no flatus yet, dry staining on dressing. Continue ice and Entereg. Good pain control. Ambulating. Patient examined and I agree with the assessment and plan  Georganna Skeans, MD, MPH, FACS Trauma: [phone] General Surgery: [phone]  12/24/2013 9:29 AM

## 2013-12-24 NOTE — Progress Notes (Signed)
Patient unable to void since foley discontinued. Bladder scan for >600 cc. In/out cath for 700 cc. [place], NP notified. Order to leave catheter in if repeat in/out cath required.

## 2013-12-25 LAB — CBC
HCT: 36.7 % — ABNORMAL LOW (ref 39.0–52.0)
HEMOGLOBIN: 12.2 g/dL — AB (ref 13.0–17.0)
MCH: 27.8 pg (ref 26.0–34.0)
MCHC: 33.2 g/dL (ref 30.0–36.0)
MCV: 83.6 fL (ref 78.0–100.0)
Platelets: 186 10*3/uL (ref 150–400)
RBC: 4.39 MIL/uL (ref 4.22–5.81)
RDW: 13 % (ref 11.5–15.5)
WBC: 9.1 10*3/uL (ref 4.0–10.5)

## 2013-12-25 LAB — BASIC METABOLIC PANEL
BUN: 3 mg/dL — ABNORMAL LOW (ref 6–23)
CO2: 31 mEq/L (ref 19–32)
Calcium: 8.3 mg/dL — ABNORMAL LOW (ref 8.4–10.5)
Chloride: 103 mEq/L (ref 96–112)
Creatinine, Ser: 1.2 mg/dL (ref 0.50–1.35)
GFR calc Af Amer: >90 mL/min (ref >90)
GFR calc non Af Amer: 83 mL/min — ABNORMAL LOW (ref >90)
GLUCOSE: 119 mg/dL — AB (ref 70–99)
POTASSIUM: 3.5 meq/L — AB (ref 3.7–5.3)
Sodium: 142 mEq/L (ref 137–147)

## 2013-12-25 MED ORDER — SODIUM CHLORIDE 0.9 % IJ SOLN
3.0000 mL | Freq: Two times a day (BID) | INTRAMUSCULAR | Status: DC
  Administered 2013-12-25 – 2013-12-26 (×3): 3 mL via INTRAVENOUS

## 2013-12-25 MED ORDER — CITALOPRAM HYDROBROMIDE 20 MG PO TABS
20.0000 mg | ORAL_TABLET | Freq: Every day | ORAL | Status: DC
  Administered 2013-12-25 – 2013-12-26 (×2): 20 mg via ORAL
  Filled 2013-12-25 (×3): qty 1

## 2013-12-25 MED ORDER — OXYCODONE HCL 5 MG PO TABS
5.0000 mg | ORAL_TABLET | ORAL | Status: DC | PRN
  Administered 2013-12-25: 10 mg via ORAL
  Administered 2013-12-25: 15 mg via ORAL
  Administered 2013-12-25 (×2): 10 mg via ORAL
  Administered 2013-12-26 (×2): 15 mg via ORAL
  Filled 2013-12-25: qty 3
  Filled 2013-12-25: qty 2
  Filled 2013-12-25 (×2): qty 3
  Filled 2013-12-25 (×2): qty 2

## 2013-12-25 MED ORDER — HYDROMORPHONE HCL PF 1 MG/ML IJ SOLN: 1.0000 mg | INTRAMUSCULAR | Status: DC | PRN

## 2013-12-25 MED ORDER — LEVETIRACETAM 750 MG PO TABS
750.0000 mg | ORAL_TABLET | Freq: Two times a day (BID) | ORAL | Status: DC
  Administered 2013-12-25 – 2013-12-27 (×5): 750 mg via ORAL
  Filled 2013-12-25 (×7): qty 1

## 2013-12-25 MED ORDER — POTASSIUM CHLORIDE CRYS ER 20 MEQ PO TBCR
40.0000 meq | EXTENDED_RELEASE_TABLET | Freq: Once | ORAL | Status: AC
  Administered 2013-12-25: 40 meq via ORAL
  Filled 2013-12-25: qty 2

## 2013-12-25 MED ORDER — SODIUM CHLORIDE 0.9 % IJ SOLN: 3.0000 mL | INTRAMUSCULAR | Status: DC | PRN
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
## 2013-12-25 NOTE — Progress Notes (Signed)
Looks good.  Feeling well.  Had a couple of bowel movements.  Advancing diet.  This patient has been seen and I agree with the findings and treatment plan.  Kathryne Eriksson. Dahlia Bailiff, MD, [place] [phone] (pager) [phone] (direct pager) Trauma Surgeon

## 2013-12-26 DIAGNOSIS — E876 Hypokalemia: Secondary | ICD-10-CM

## 2013-12-26 LAB — BASIC METABOLIC PANEL
CO2: 31 mEq/L (ref 19–32)
Calcium: 8.7 mg/dL (ref 8.4–10.5)
Chloride: 98 mEq/L (ref 96–112)
Creatinine, Ser: 1.09 mg/dL (ref 0.50–1.35)
GFR calc Af Amer: >90 mL/min (ref >90)
GFR calc non Af Amer: >90 mL/min (ref >90)
Glucose, Bld: 117 mg/dL — ABNORMAL HIGH (ref 70–99)
POTASSIUM: 3.1 meq/L — AB (ref 3.7–5.3)
Sodium: 141 mEq/L (ref 137–147)

## 2013-12-26 MED ORDER — POTASSIUM CHLORIDE CRYS ER 20 MEQ PO TBCR
40.0000 meq | EXTENDED_RELEASE_TABLET | Freq: Once | ORAL | Status: AC
  Administered 2013-12-26: 40 meq via ORAL
  Filled 2013-12-26: qty 2

## 2013-12-26 MED ORDER — POTASSIUM CHLORIDE CRYS ER 20 MEQ PO TBCR
20.0000 meq | EXTENDED_RELEASE_TABLET | Freq: Two times a day (BID) | ORAL | Status: AC
  Administered 2013-12-26 (×2): 20 meq via ORAL
  Filled 2013-12-26 (×2): qty 1
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
## 2013-12-27 LAB — BASIC METABOLIC PANEL
BUN: 3 mg/dL — ABNORMAL LOW (ref 6–23)
CALCIUM: 9 mg/dL (ref 8.4–10.5)
CO2: 27 mEq/L (ref 19–32)
CREATININE: 1.03 mg/dL (ref 0.50–1.35)
Chloride: 101 mEq/L (ref 96–112)
GFR calc Af Amer: >90 mL/min (ref >90)
GFR calc non Af Amer: >90 mL/min (ref >90)
Glucose, Bld: 98 mg/dL (ref 70–99)
Potassium: 3.6 mEq/L — ABNORMAL LOW (ref 3.7–5.3)
Sodium: 140 mEq/L (ref 137–147)

## 2013-12-27 MED ORDER — OXYCODONE-ACETAMINOPHEN 7.5-325 MG PO TABS: 1.0000 | ORAL_TABLET | ORAL | Status: DC | PRN

## 2013-12-27 NOTE — Discharge Summary (Signed)
Dakota Gritton, MD, MPH, FACS Trauma: [phone] General Surgery: [phone]  

## 2013-12-27 NOTE — Discharge Planning (Signed)
Copy of AVS and rx to pt who verbalizes understanding.  D'cd amb. To private car home with all personal belongings at 110.

## 2013-12-27 NOTE — Discharge Instructions (Signed)
Wash wounds daily in shower with soap and water. Do not soak. Apply antibiotic ointment (e.g. Neosporin) twice daily and as needed to keep moist. Cover with dry dressing.  No lifting more than 5 pounds for 6 weeks.  No driving while taking oxycodone.
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
## 2013-12-27 NOTE — Progress Notes (Signed)
D/C today Dakota Skeans, MD, MPH, FACS Trauma: [phone] General Surgery: [phone]

## 2014-01-01 ENCOUNTER — Encounter (INDEPENDENT_AMBULATORY_CARE_PROVIDER_SITE_OTHER): Payer: Self-pay

## 2014-01-01 ENCOUNTER — Ambulatory Visit (INDEPENDENT_AMBULATORY_CARE_PROVIDER_SITE_OTHER): Payer: 59 | Admitting: General Surgery

## 2014-01-01 VITALS — BP 126/80 | HR 80 | Temp 97.2°F | Ht 72.0 in | Wt 200.0 lb

## 2014-01-01 DIAGNOSIS — Z9889 Other specified postprocedural states: Secondary | ICD-10-CM

## 2014-01-01 NOTE — Patient Instructions (Signed)
Daily wet to dry dressing change to the small opening on the right side.  Call for fevers, chills or sweats.

## 2014-01-01 NOTE — Progress Notes (Signed)
Subjective: post op check     Patient ID: Dakota Evans, male   DOB: [birth year], 28 y.o.   MRN: [id number]  Suture / Staple Removal   Dakota Evans presents today for a post op check following a ileostomy takedown on 12/27/13.  He reports mild intermittent pain, pain medication working well.  He complaints of loose stools.  No nausea, vomiting, fever or chills.  Appetite is adequate. He is currently not working.  Works at the post [employer]  Review of Systems  All other systems reviewed and are negative.      Objective:   Physical Exam  Constitutional: He appears well-developed and well-nourished. No distress.  Abdominal: Soft. Bowel sounds are normal. He exhibits no distension and no mass. There is no tenderness. There is no rebound and no guarding.  Midline incision-healing well, staples removed Previous ostomy site-hematoma evacuated and the wound was packed.    Skin: He is not diaphoretic.      Filed Vitals:   01/01/14 1439  BP: 126/80  Pulse: 80  Temp: 97.2 F (36.2 C)    Assessment:     S/p ileostomy takedown Previous ileostomy site, opened    Plan:     Start daily wet to dry dressing changes to this area.  It does not appear infected and therefore antibiotics are not necessary at present time.  Continue with 15lb weight restriction.  PRN pain meds, transition to OTC pain meds. Follow up in 3 weeks for a wound check     Dakota Evans, ANP-BC

## 2014-01-07 ENCOUNTER — Telehealth: Payer: Self-pay

## 2014-01-07 NOTE — Telephone Encounter (Signed)
Advised patient he needs to follow up with his surgeon about work [employer].

## 2014-01-07 NOTE — Telephone Encounter (Signed)
His return to work would be up to surgery (dr. Silva Bandy al) who just released him from the hospital a couple weeks ago.

## 2014-01-07 NOTE — Telephone Encounter (Signed)
Patient would like a letter stating he can go back to work with what limitations.  Contact patient when ready.

## 2014-01-15 ENCOUNTER — Encounter: Payer: Self-pay | Admitting: Neurology

## 2014-01-15 ENCOUNTER — Ambulatory Visit (INDEPENDENT_AMBULATORY_CARE_PROVIDER_SITE_OTHER): Payer: No Typology Code available for payment source | Admitting: Neurology

## 2014-01-15 VITALS — BP 132/80 | HR 72 | Temp 97.4°F | Resp 18 | Ht 73.0 in | Wt 200.4 lb

## 2014-01-15 DIAGNOSIS — R561 Post traumatic seizures: Secondary | ICD-10-CM | POA: Diagnosis not present

## 2014-01-15 DIAGNOSIS — G40109 Localization-related (focal) (partial) symptomatic epilepsy and epileptic syndromes with simple partial seizures, not intractable, without status epilepticus: Secondary | ICD-10-CM | POA: Diagnosis not present

## 2014-01-15 NOTE — Patient Instructions (Signed)
1.  Continue Keppra 750mg  twice daily 2.  You may resume driving 3.  Follow up in 6 months or as needed.
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
## 2014-01-22 ENCOUNTER — Encounter (INDEPENDENT_AMBULATORY_CARE_PROVIDER_SITE_OTHER): Payer: Self-pay

## 2014-01-22 ENCOUNTER — Encounter (INDEPENDENT_AMBULATORY_CARE_PROVIDER_SITE_OTHER): Payer: Self-pay | Admitting: *Deleted

## 2014-01-22 ENCOUNTER — Ambulatory Visit (INDEPENDENT_AMBULATORY_CARE_PROVIDER_SITE_OTHER): Payer: 59 | Admitting: General Surgery

## 2014-01-22 VITALS — BP 114/72 | HR 72 | Temp 98.7°F | Resp 14 | Ht 73.0 in | Wt 197.8 lb

## 2014-01-22 DIAGNOSIS — K631 Perforation of intestine (nontraumatic): Secondary | ICD-10-CM

## 2014-01-22 DIAGNOSIS — Z9889 Other specified postprocedural states: Secondary | ICD-10-CM

## 2014-01-22 DIAGNOSIS — Z932 Ileostomy status: Secondary | ICD-10-CM

## 2014-01-22 NOTE — Patient Instructions (Addendum)
Continue daily dry dressing changes to this area.  Weight restriction 20lb weight restriction, but can return to work with light duty.  In 2 more weeks you can lift up to 40lbs for 2 weeks, then no restrictions.  Over the counter pain meds for pain.  Follow up as needed.  Fiber Content in Foods Drinking plenty of fluids and consuming foods high in fiber can help with constipation. See the list below for the fiber content of some common foods. Starches and Grains / Dietary Fiber (g)  Cheerios, 1 cup / 3 g  Kellogg's Corn Flakes, 1 cup / 0.7 g  Rice Krispies, 1  cup / 0.3 g  Quaker Oat Life Cereal,  cup / 2.1 g  Oatmeal, instant (cooked),  cup / 2 g  Kellogg's Frosted Mini Wheats, 1 cup / 5.1 g  Rice, brown, long-grain (cooked), 1 cup / 3.5 g  Rice, white, long-grain (cooked), 1 cup / 0.6 g  Macaroni, cooked, enriched, 1 cup / 2.5 g Legumes / Dietary Fiber (g)  Beans, baked, canned, plain or vegetarian,  cup / 5.2 g  Beans, kidney, canned,  cup / 6.8 g  Beans, pinto, dried (cooked),  cup / 7.7 g  Beans, pinto, canned,  cup / 5.5 g Breads and Crackers / Dietary Fiber (g)  Graham crackers, plain or honey, 2 squares / 0.7 g  Saltine crackers, 3 squares / 0.3 g  Pretzels, plain, salted, 10 pieces / 1.8 g  Bread, whole-wheat, 1 slice / 1.9 g  Bread, white, 1 slice / 0.7 g  Bread, raisin, 1 slice / 1.2 g  Bagel, plain, 3 oz / 2 g  Tortilla, flour, 1 oz / 0.9 g  Tortilla, corn, 1 small / 1.5 g  Bun, hamburger or hotdog, 1 small / 0.9 g Fruits / Dietary Fiber (g)  Apple, raw with skin, 1 medium / 4.4 g  Applesauce, sweetened,  cup / 1.5 g  Banana,  medium / 1.5 g  Grapes, 10 grapes / 0.4 g  Orange, 1 small / 2.3 g  Raisin, 1.5 oz / 1.6 g  Melon, 1 cup / 1.4 g Vegetables / Dietary Fiber (g)  Green beans, canned,  cup / 1.3 g  Carrots (cooked),  cup / 2.3 g  Broccoli (cooked),  cup / 2.8 g  Peas, frozen (cooked),  cup / 4.4 g  Potatoes,  mashed,  cup / 1.6 g  Lettuce, 1 cup / 0.5 g  Corn, canned,  cup / 1.6 g  Tomato,  cup / 1.1 g Document Released: 12/11/2006 Document Revised: 10/17/2011 Document Reviewed: 02/05/2007 ExitCare Patient Information 2015 [place], [place]. This information is not intended to replace advice given to you by your health care Chandon Lazcano. Make sure you discuss any questions you have with your health care Jessalyn Hinojosa.

## 2014-01-22 NOTE — Progress Notes (Signed)
Subjective: Dakota Evans is a 28 y.o. male who presents today for follow up for a post op check following a ileostomy takedown on 12/27/13.  He was last seen on 01/01/14 and was asked to follow up for a wound check.   He was discharged from the hospital on 12/27/13.  The patient is tolerating his diet well and is having no severe pain.  Pain improved.  Bowel function is good, but still having loose stools.  Is taking imodium 2 tabs in am and pm.  Pt would like to return to work.  He thinks they can accommodate his light duty.  He has transitioned to using a band-aid for the old ileostomy site.  No nausea, vomiting, fever or chills. Appetite is adequate. Works at the [employer].    Objective: Vital signs in last 24 hours: Reviewed   PE: General:  Alert, NAD, pleasant Abdomen:  soft, NT/ND, +bs, well healed midline incision appear well-healed with no sign of infection or bleeding   Assessment/Plan S/p ileostomy takedown  Previous ileostomy site, opened  Plan: 1.  Cont daily dry dressing changes to this area. No signs of infection.  Weight restriction 20lb weight restriction, but can return to work with light duty.  OTC pain meds for pain. Follow up as needed.  Can apply vit e ointments/scar cream to help with wound healing.  Try to wean off the imodium.  Stay hydrated.  Try metamucil and increase fiber in diet.    Coralie Keens, PA-C 01/22/2014

## 2014-02-13 ENCOUNTER — Emergency Department (HOSPITAL_BASED_OUTPATIENT_CLINIC_OR_DEPARTMENT_OTHER)
Admission: EM | Admit: 2014-02-13 | Discharge: 2014-02-14 | Disposition: A | Discharge status: Home / Self Care | Payer: 59 | Attending: Emergency Medicine | Admitting: Emergency Medicine

## 2014-02-13 ENCOUNTER — Encounter (HOSPITAL_BASED_OUTPATIENT_CLINIC_OR_DEPARTMENT_OTHER): Payer: Self-pay | Admitting: Emergency Medicine

## 2014-02-13 DIAGNOSIS — R569 Unspecified convulsions: Secondary | ICD-10-CM

## 2014-02-13 DIAGNOSIS — G40909 Epilepsy, unspecified, not intractable, without status epilepticus: Secondary | ICD-10-CM | POA: Insufficient documentation

## 2014-02-13 DIAGNOSIS — Z87828 Personal history of other (healed) physical injury and trauma: Secondary | ICD-10-CM | POA: Insufficient documentation

## 2014-02-13 DIAGNOSIS — Z79899 Other long term (current) drug therapy: Secondary | ICD-10-CM | POA: Insufficient documentation

## 2014-02-13 DIAGNOSIS — F329 Major depressive disorder, single episode, unspecified: Secondary | ICD-10-CM | POA: Insufficient documentation

## 2014-02-13 DIAGNOSIS — Z8619 Personal history of other infectious and parasitic diseases: Secondary | ICD-10-CM | POA: Insufficient documentation

## 2014-02-13 DIAGNOSIS — Z87891 Personal history of nicotine dependence: Secondary | ICD-10-CM | POA: Insufficient documentation

## 2014-02-13 DIAGNOSIS — F3289 Other specified depressive episodes: Secondary | ICD-10-CM | POA: Insufficient documentation

## 2014-02-13 DIAGNOSIS — I10 Essential (primary) hypertension: Secondary | ICD-10-CM | POA: Insufficient documentation

## 2014-02-13 MED ORDER — KETOROLAC TROMETHAMINE 30 MG/ML IJ SOLN
60.0000 mg | Freq: Once | INTRAMUSCULAR | Status: AC
  Administered 2014-02-14: 60 mg via INTRAMUSCULAR
  Filled 2014-02-13: qty 2

## 2014-02-13 MED ORDER — LEVETIRACETAM 500 MG PO TABS
1000.0000 mg | ORAL_TABLET | Freq: Once | ORAL | Status: AC
  Administered 2014-02-14: 1000 mg via ORAL
  Filled 2014-02-13: qty 2

## 2014-02-13 NOTE — ED Notes (Signed)
Pt states last seizure before today was in March-started having seizure Dec 2014
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
## 2014-02-13 NOTE — ED Notes (Signed)
Pt sts he had a seizure 30 min ago and fell down. Pt now c/o HA.

## 2014-02-14 NOTE — Discharge Instructions (Signed)
Epilepsy °People with epilepsy have times when they shake and jerk uncontrollably (seizures). This happens when there is a sudden change in brain function. Epilepsy may have many possible causes. Anything that disturbs the normal pattern of brain cell activity can lead to seizures. °HOME CARE  °· Follow your doctor's instructions about driving and safety during normal activities. °· Get enough sleep. °· Only take medicine as told by your doctor. °· Avoid things that you know can cause you to have seizures (triggers). °· Write down when your seizures happen and what you remember about each seizure. Write down anything you think may have caused the seizure to happen. °· Tell the people you live and work with that you have seizures. Make sure they know how to help you. They should: °¨ Cushion your head and body. °¨ Turn you on your side. °¨ Not restrain you. °¨ Not place anything inside your mouth. °¨ Call for local emergency medical help if there is any question about what has happened. °· Keep all follow-up visits with your doctor. This is very important. °GET HELP IF: °· You get an infection or start to feel sick. You may have more seizures when you are sick. °· You are having seizures more often. °· Your seizure pattern is changing. °GET HELP RIGHT AWAY IF:  °· A seizure does not stop after a few seconds or minutes. °· A seizure causes you to have trouble breathing. °· A seizure gives you a very bad headache. °· A seizure makes you unable to speak or use a part of your body. °Document Released: 05/22/2009 Document Revised: 05/15/2013 Document Reviewed: 03/06/2013 °ExitCare® Patient Information ©2015 [employer]. This information is not intended to replace advice given to you by your health care provider. Make sure you discuss any questions you have with your health care provider. ° °

## 2014-02-17 ENCOUNTER — Encounter: Payer: Self-pay | Admitting: Neurology

## 2014-02-17 ENCOUNTER — Ambulatory Visit (INDEPENDENT_AMBULATORY_CARE_PROVIDER_SITE_OTHER): Payer: No Typology Code available for payment source | Admitting: Neurology

## 2014-02-17 VITALS — BP 140/90 | HR 70 | Temp 98.1°F | Resp 18 | Ht 73.0 in | Wt 203.0 lb

## 2014-02-17 DIAGNOSIS — G40109 Localization-related (focal) (partial) symptomatic epilepsy and epileptic syndromes with simple partial seizures, not intractable, without status epilepticus: Secondary | ICD-10-CM | POA: Diagnosis not present

## 2014-02-17 MED ORDER — LEVETIRACETAM 250 MG PO TABS: ORAL_TABLET | ORAL | Status: DC
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
## 2014-02-17 NOTE — Patient Instructions (Signed)
1.  We will have to increase Keppra to 1000mg  twice daily.  Since you just picked up a prescription for the 750mg  tablets, I will prescribe you 250mg  tablets and take one tablet with a 750mg  tablet twice daily.  At the end of the month, we will prescribe 1000mg  twice daily 2.  Unfortunately, you will not be able to drive for 6 months from 02/13/14. 3.  Follow up in 3 months.

## 2014-03-19 ENCOUNTER — Other Ambulatory Visit: Payer: Self-pay | Admitting: Neurology

## 2014-03-20 MED ORDER — LEVETIRACETAM 1000 MG PO TABS: 1000.0000 mg | ORAL_TABLET | Freq: Two times a day (BID) | ORAL | Status: DC

## 2014-03-20 NOTE — Telephone Encounter (Signed)
Keppra refill requested. Per last office note- patient to remain on medication, but increased to 1000 mg BID. Refill changed to reflect 1000 mg BID and sent to patient's pharmacy.

## 2014-04-21 ENCOUNTER — Encounter (HOSPITAL_COMMUNITY): Payer: Self-pay | Admitting: Emergency Medicine

## 2014-04-21 ENCOUNTER — Emergency Department (HOSPITAL_COMMUNITY): Payer: No Typology Code available for payment source

## 2014-04-21 ENCOUNTER — Emergency Department (HOSPITAL_COMMUNITY)
Admission: EM | Admit: 2014-04-21 | Discharge: 2014-04-21 | Disposition: A | Discharge status: Other Acute Inpatient Hospital | Payer: No Typology Code available for payment source

## 2014-04-21 ENCOUNTER — Emergency Department (HOSPITAL_COMMUNITY)
Admission: EM | Admit: 2014-04-21 | Discharge: 2014-04-22 | Disposition: A | Discharge status: Home / Self Care | Payer: Self-pay | Attending: Emergency Medicine | Admitting: Emergency Medicine

## 2014-04-21 DIAGNOSIS — F329 Major depressive disorder, single episode, unspecified: Secondary | ICD-10-CM | POA: Insufficient documentation

## 2014-04-21 DIAGNOSIS — F3289 Other specified depressive episodes: Secondary | ICD-10-CM | POA: Insufficient documentation

## 2014-04-21 DIAGNOSIS — Z87828 Personal history of other (healed) physical injury and trauma: Secondary | ICD-10-CM | POA: Insufficient documentation

## 2014-04-21 DIAGNOSIS — Z87891 Personal history of nicotine dependence: Secondary | ICD-10-CM | POA: Insufficient documentation

## 2014-04-21 DIAGNOSIS — R569 Unspecified convulsions: Secondary | ICD-10-CM | POA: Insufficient documentation

## 2014-04-21 DIAGNOSIS — R61 Generalized hyperhidrosis: Secondary | ICD-10-CM | POA: Insufficient documentation

## 2014-04-21 DIAGNOSIS — I10 Essential (primary) hypertension: Secondary | ICD-10-CM | POA: Insufficient documentation

## 2014-04-21 DIAGNOSIS — G40909 Epilepsy, unspecified, not intractable, without status epilepticus: Secondary | ICD-10-CM | POA: Insufficient documentation

## 2014-04-21 DIAGNOSIS — R4701 Aphasia: Secondary | ICD-10-CM | POA: Insufficient documentation

## 2014-04-21 DIAGNOSIS — R000 Tachycardia, unspecified: Secondary | ICD-10-CM | POA: Insufficient documentation

## 2014-04-21 DIAGNOSIS — Z8619 Personal history of other infectious and parasitic diseases: Secondary | ICD-10-CM | POA: Insufficient documentation

## 2014-04-21 LAB — BASIC METABOLIC PANEL
ANION GAP: 38 — AB (ref 5–15)
ANION GAP: 13 (ref 5–15)
BUN: 8 mg/dL (ref 6–23)
BUN: 6 mg/dL (ref 6–23)
CALCIUM: 10.4 mg/dL (ref 8.4–10.5)
CO2: 12 meq/L — AB (ref 19–32)
CO2: 24 mEq/L (ref 19–32)
CREATININE: 1.34 mg/dL (ref 0.50–1.35)
Calcium: 8.6 mg/dL (ref 8.4–10.5)
Chloride: 92 mEq/L — ABNORMAL LOW (ref 96–112)
Chloride: 102 mEq/L (ref 96–112)
Creatinine, Ser: 1.13 mg/dL (ref 0.50–1.35)
GFR calc Af Amer: 83 mL/min — ABNORMAL LOW (ref >90)
GFR calc non Af Amer: 72 mL/min — ABNORMAL LOW (ref >90)
GFR, EST NON AFRICAN AMERICAN: 88 mL/min — AB (ref >90)
GLUCOSE: 93 mg/dL (ref 70–99)
Glucose, Bld: 106 mg/dL — ABNORMAL HIGH (ref 70–99)
POTASSIUM: 3.7 meq/L (ref 3.7–5.3)
Potassium: 4.4 mEq/L (ref 3.7–5.3)
SODIUM: 139 meq/L (ref 137–147)
Sodium: 142 mEq/L (ref 137–147)

## 2014-04-21 LAB — URINALYSIS, ROUTINE W REFLEX MICROSCOPIC
BILIRUBIN URINE: NEGATIVE
GLUCOSE, UA: NEGATIVE mg/dL
Ketones, ur: NEGATIVE mg/dL
Leukocytes, UA: NEGATIVE
Nitrite: NEGATIVE
Protein, ur: 30 mg/dL — AB
SPECIFIC GRAVITY, URINE: 1.016 (ref 1.005–1.030)
Urobilinogen, UA: 0.2 mg/dL (ref 0.0–1.0)
pH: 5.5 (ref 5.0–8.0)

## 2014-04-21 LAB — CBC
HCT: 49.6 % (ref 39.0–52.0)
Hemoglobin: 16.1 g/dL (ref 13.0–17.0)
MCH: 28.2 pg (ref 26.0–34.0)
MCHC: 32.5 g/dL (ref 30.0–36.0)
MCV: 87 fL (ref 78.0–100.0)
PLATELETS: 262 10*3/uL (ref 150–400)
RBC: 5.7 MIL/uL (ref 4.22–5.81)
RDW: 13.2 % (ref 11.5–15.5)
WBC: 14.9 10*3/uL — ABNORMAL HIGH (ref 4.0–10.5)

## 2014-04-21 LAB — URINE MICROSCOPIC-ADD ON

## 2014-04-21 MED ORDER — LEVETIRACETAM IN NACL 1500 MG/100ML IV SOLN
1500.0000 mg | Freq: Once | INTRAVENOUS | Status: AC
  Administered 2014-04-21: 1500 mg via INTRAVENOUS
  Filled 2014-04-21: qty 100

## 2014-04-21 MED ORDER — LEVETIRACETAM 250 MG PO TABS: 250.0000 mg | ORAL_TABLET | Freq: Two times a day (BID) | ORAL | Status: DC

## 2014-04-21 MED ORDER — SODIUM CHLORIDE 0.9 % IV BOLUS (SEPSIS)
1000.0000 mL | Freq: Once | INTRAVENOUS | Status: AC
  Administered 2014-04-21: 1000 mL via INTRAVENOUS

## 2014-04-21 MED ORDER — ONDANSETRON 4 MG PO TBDP: ORAL_TABLET | ORAL | Status: DC

## 2014-04-21 MED ORDER — LORAZEPAM 2 MG/ML IJ SOLN
2.0000 mg | Freq: Once | INTRAMUSCULAR | Status: AC
  Administered 2014-04-21: 2 mg via INTRAVENOUS

## 2014-04-21 MED ORDER — LEVETIRACETAM 1000 MG PO TABS: 1000.0000 mg | ORAL_TABLET | Freq: Two times a day (BID) | ORAL | Status: DC

## 2014-04-21 MED ORDER — LORAZEPAM 2 MG/ML IJ SOLN
INTRAMUSCULAR | Status: AC
  Filled 2014-04-21: qty 1

## 2014-04-21 NOTE — ED Notes (Signed)
Patient is aware we need a urine- urinal at bedside

## 2014-04-21 NOTE — Discharge Instructions (Signed)
1. Medications: Keppra 1250mg  twice per day (you have been 1000 mg and 250 mg tablets to take together to achieve this dosage),zofran, usual home medications 2. Treatment: rest, drink plenty of fluids,  3. Follow Up: Please followup with your neurologist in 2 days for discussion of your diagnoses and further evaluation after today's visit;
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
## 2014-04-21 NOTE — ED Provider Notes (Signed)
Medical screening examination/treatment/procedure(s) were conducted as a shared visit with non-physician practitioner(s) and myself.  I personally evaluated the patient during the encounter.   EKG Interpretation None        Houston Siren III, MD 04/21/14 [phone]

## 2014-04-21 NOTE — ED Notes (Signed)
Patient states he had a seizure this am @ 0930, unable to recall the details. Patient states he has a history of seizures and takes medication. Last seizure was @3  months ago.

## 2014-04-21 NOTE — ED Provider Notes (Addendum)
Medical screening examination/treatment/procedure(s) were conducted as a shared visit with non-physician practitioner(s) and myself.  I personally evaluated the patient during the encounter.   EKG Interpretation None      28 yo male with hx of seizures presenting after a seizure today.  Shortly after arrival, he had a second generalized tonic clonic seizure.  On exam, alert, but disoriented and confused, normal respiratory effort, normal perfusion, lungs CTAB, heart sounds normal with tachy rate and regular rhythm.  Observed without additional seizure.  Anion gap on initial labs felt to be secondary to seizure.  Cleared after fluids.  Pt well appearing, stable for outpatient follow up.    Clinical Impression: 1. Seizure      Arbie Cookey, MD 04/21/14 [place], MD 04/21/14 [phone]

## 2014-04-23 ENCOUNTER — Encounter: Payer: Self-pay | Admitting: Neurology

## 2014-04-23 ENCOUNTER — Ambulatory Visit (INDEPENDENT_AMBULATORY_CARE_PROVIDER_SITE_OTHER): Payer: Self-pay | Admitting: Neurology

## 2014-04-23 VITALS — BP 130/76 | HR 66 | Temp 98.0°F | Resp 18 | Wt 210.9 lb

## 2014-04-23 DIAGNOSIS — G40109 Localization-related (focal) (partial) symptomatic epilepsy and epileptic syndromes with simple partial seizures, not intractable, without status epilepticus: Secondary | ICD-10-CM

## 2014-04-23 LAB — LEVETIRACETAM LEVEL: Levetiracetam Lvl: 9.8 ug/mL
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
## 2014-04-23 NOTE — Patient Instructions (Addendum)
1.  Continue Keppra 1000mg  twice daily 2.  No driving 3.  Follow up in January, after the 9th.

## 2014-05-19 ENCOUNTER — Ambulatory Visit: Payer: No Typology Code available for payment source | Admitting: Physical Medicine & Rehabilitation

## 2014-05-20 ENCOUNTER — Ambulatory Visit: Payer: No Typology Code available for payment source | Admitting: Neurology

## 2014-05-20 ENCOUNTER — Telehealth: Payer: Self-pay | Admitting: Neurology

## 2014-05-20 ENCOUNTER — Other Ambulatory Visit: Payer: Self-pay | Admitting: *Deleted

## 2014-05-20 MED ORDER — LEVETIRACETAM 1000 MG PO TABS: 1000.0000 mg | ORAL_TABLET | Freq: Two times a day (BID) | ORAL | Status: DC

## 2014-05-20 NOTE — Telephone Encounter (Signed)
We can prescribe one month (60 tablets) with 5 refills.

## 2014-05-20 NOTE — Telephone Encounter (Signed)
Pt called f/u on the refill request from earlier.  C/B [phone]

## 2014-05-20 NOTE — Telephone Encounter (Signed)
Pt was seen in sept and needs his keppra 1000mg  called in to [facility] on [address] pt phone number is [phone]  Pt is not coming in for his appt to day since he was told to follow up in Jan. We must have forgot to cancel todays  appt

## 2014-05-20 NOTE — Telephone Encounter (Signed)
Is this ok to refill (?) and for how many 1 month 90 days(?). Patient is not coming in for follow up today he was just in Sept and was told to follow up in Jan original follow up was not cancelled. Please advise

## 2014-05-21 ENCOUNTER — Ambulatory Visit: Payer: No Typology Code available for payment source | Admitting: Physical Medicine & Rehabilitation

## 2014-06-27 ENCOUNTER — Emergency Department (HOSPITAL_BASED_OUTPATIENT_CLINIC_OR_DEPARTMENT_OTHER): Payer: Self-pay

## 2014-06-27 ENCOUNTER — Encounter (HOSPITAL_BASED_OUTPATIENT_CLINIC_OR_DEPARTMENT_OTHER): Payer: Self-pay | Admitting: *Deleted

## 2014-06-27 DIAGNOSIS — R1030 Lower abdominal pain, unspecified: Secondary | ICD-10-CM | POA: Insufficient documentation

## 2014-06-27 DIAGNOSIS — I10 Essential (primary) hypertension: Secondary | ICD-10-CM | POA: Insufficient documentation

## 2014-06-27 NOTE — ED Notes (Signed)
Pt reports constipation/diarrhea and lower abdominal pain x 9 days.

## 2014-06-28 ENCOUNTER — Emergency Department (HOSPITAL_BASED_OUTPATIENT_CLINIC_OR_DEPARTMENT_OTHER)
Admission: EM | Admit: 2014-06-28 | Discharge: 2014-06-28 | Disposition: A | Discharge status: Home / Self Care | Payer: No Typology Code available for payment source | Attending: Emergency Medicine | Admitting: Emergency Medicine

## 2014-06-28 ENCOUNTER — Emergency Department (HOSPITAL_BASED_OUTPATIENT_CLINIC_OR_DEPARTMENT_OTHER): Payer: No Typology Code available for payment source

## 2014-06-28 ENCOUNTER — Emergency Department (HOSPITAL_BASED_OUTPATIENT_CLINIC_OR_DEPARTMENT_OTHER)
Admission: EM | Admit: 2014-06-28 | Discharge: 2014-06-28 | Discharge status: Against Medical Advice | Payer: No Typology Code available for payment source | Attending: Emergency Medicine | Admitting: Emergency Medicine

## 2014-06-28 ENCOUNTER — Encounter (HOSPITAL_BASED_OUTPATIENT_CLINIC_OR_DEPARTMENT_OTHER): Payer: Self-pay | Admitting: *Deleted

## 2014-06-28 DIAGNOSIS — Z87891 Personal history of nicotine dependence: Secondary | ICD-10-CM | POA: Insufficient documentation

## 2014-06-28 DIAGNOSIS — R110 Nausea: Secondary | ICD-10-CM | POA: Insufficient documentation

## 2014-06-28 DIAGNOSIS — Z8619 Personal history of other infectious and parasitic diseases: Secondary | ICD-10-CM | POA: Insufficient documentation

## 2014-06-28 DIAGNOSIS — Z87828 Personal history of other (healed) physical injury and trauma: Secondary | ICD-10-CM | POA: Insufficient documentation

## 2014-06-28 DIAGNOSIS — R52 Pain, unspecified: Secondary | ICD-10-CM

## 2014-06-28 DIAGNOSIS — F329 Major depressive disorder, single episode, unspecified: Secondary | ICD-10-CM | POA: Insufficient documentation

## 2014-06-28 DIAGNOSIS — K5900 Constipation, unspecified: Secondary | ICD-10-CM | POA: Insufficient documentation

## 2014-06-28 DIAGNOSIS — I10 Essential (primary) hypertension: Secondary | ICD-10-CM | POA: Insufficient documentation

## 2014-06-28 DIAGNOSIS — Z9889 Other specified postprocedural states: Secondary | ICD-10-CM | POA: Insufficient documentation

## 2014-06-28 DIAGNOSIS — Z79899 Other long term (current) drug therapy: Secondary | ICD-10-CM | POA: Insufficient documentation

## 2014-06-28 DIAGNOSIS — G40909 Epilepsy, unspecified, not intractable, without status epilepticus: Secondary | ICD-10-CM | POA: Insufficient documentation

## 2014-06-28 NOTE — ED Notes (Signed)
Pt reports lower abd pain x 10 days- last BM yesterday- states came here last night but let prior to being seen due to wait
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
## 2014-06-28 NOTE — Discharge Instructions (Signed)

## 2014-07-15 ENCOUNTER — Emergency Department (HOSPITAL_BASED_OUTPATIENT_CLINIC_OR_DEPARTMENT_OTHER)
Admission: EM | Admit: 2014-07-15 | Discharge: 2014-07-15 | Disposition: A | Discharge status: Home / Self Care | Payer: No Typology Code available for payment source | Attending: Emergency Medicine | Admitting: Emergency Medicine

## 2014-07-15 ENCOUNTER — Encounter (HOSPITAL_BASED_OUTPATIENT_CLINIC_OR_DEPARTMENT_OTHER): Payer: Self-pay | Admitting: *Deleted

## 2014-07-15 DIAGNOSIS — Z8619 Personal history of other infectious and parasitic diseases: Secondary | ICD-10-CM | POA: Insufficient documentation

## 2014-07-15 DIAGNOSIS — Z87828 Personal history of other (healed) physical injury and trauma: Secondary | ICD-10-CM | POA: Insufficient documentation

## 2014-07-15 DIAGNOSIS — Z79899 Other long term (current) drug therapy: Secondary | ICD-10-CM | POA: Insufficient documentation

## 2014-07-15 DIAGNOSIS — F329 Major depressive disorder, single episode, unspecified: Secondary | ICD-10-CM | POA: Insufficient documentation

## 2014-07-15 DIAGNOSIS — G43909 Migraine, unspecified, not intractable, without status migrainosus: Secondary | ICD-10-CM | POA: Insufficient documentation

## 2014-07-15 DIAGNOSIS — R569 Unspecified convulsions: Secondary | ICD-10-CM

## 2014-07-15 DIAGNOSIS — G40909 Epilepsy, unspecified, not intractable, without status epilepticus: Secondary | ICD-10-CM | POA: Insufficient documentation

## 2014-07-15 DIAGNOSIS — I10 Essential (primary) hypertension: Secondary | ICD-10-CM | POA: Insufficient documentation

## 2014-07-15 DIAGNOSIS — Z87891 Personal history of nicotine dependence: Secondary | ICD-10-CM | POA: Insufficient documentation

## 2014-07-15 LAB — COMPREHENSIVE METABOLIC PANEL
ALBUMIN: 4.2 g/dL (ref 3.5–5.2)
ALK PHOS: 120 U/L — AB (ref 39–117)
ALT: 28 U/L (ref 0–53)
AST: 28 U/L (ref 0–37)
Anion gap: >20 — ABNORMAL HIGH (ref 5–15)
BUN: 10 mg/dL (ref 6–23)
CHLORIDE: 99 meq/L (ref 96–112)
CO2: 20 mEq/L (ref 19–32)
Calcium: 9.7 mg/dL (ref 8.4–10.5)
Creatinine, Ser: 1.3 mg/dL (ref 0.50–1.35)
GFR calc Af Amer: 87 mL/min — ABNORMAL LOW (ref >90)
GFR calc non Af Amer: 75 mL/min — ABNORMAL LOW (ref >90)
Glucose, Bld: 106 mg/dL — ABNORMAL HIGH (ref 70–99)
POTASSIUM: 3.5 meq/L — AB (ref 3.7–5.3)
Sodium: 143 mEq/L (ref 137–147)
Total Protein: 8.7 g/dL — ABNORMAL HIGH (ref 6.0–8.3)

## 2014-07-15 LAB — CBC WITH DIFFERENTIAL/PLATELET
BASOS PCT: 0 % (ref 0–1)
Basophils Absolute: 0 10*3/uL (ref 0.0–0.1)
Eosinophils Absolute: 0.2 10*3/uL (ref 0.0–0.7)
Eosinophils Relative: 2 % (ref 0–5)
HCT: 45.6 % (ref 39.0–52.0)
Hemoglobin: 15.4 g/dL (ref 13.0–17.0)
Lymphocytes Relative: 34 % (ref 12–46)
Lymphs Abs: 3.3 10*3/uL (ref 0.7–4.0)
MCH: 27.9 pg (ref 26.0–34.0)
MCHC: 33.8 g/dL (ref 30.0–36.0)
MCV: 82.6 fL (ref 78.0–100.0)
Monocytes Absolute: 1.1 10*3/uL — ABNORMAL HIGH (ref 0.1–1.0)
Monocytes Relative: 11 % (ref 3–12)
NEUTROS ABS: 5 10*3/uL (ref 1.7–7.7)
NEUTROS PCT: 53 % (ref 43–77)
PLATELETS: 294 10*3/uL (ref 150–400)
RBC: 5.52 MIL/uL (ref 4.22–5.81)
RDW: 13.1 % (ref 11.5–15.5)
WBC: 9.6 10*3/uL (ref 4.0–10.5)

## 2014-07-15 LAB — CARBAMAZEPINE LEVEL, TOTAL: CARBAMAZEPINE LVL: 2.4 ug/mL — AB (ref 4.0–12.0)

## 2014-07-15 NOTE — ED Notes (Signed)
EMS reports pt found in the parking lot at his place of employment. Pt appeared postictal. Pt has hx of seizures. Pt sts he was experiencing abdominal pain prior to the seizure.

## 2014-07-15 NOTE — ED Notes (Signed)
MD at bedside. 

## 2014-07-15 NOTE — ED Notes (Signed)
Pt ambulated in hall with stand by assist with no problems. Patient complained of headache throbbing, Transport planner notified.

## 2014-07-15 NOTE — Discharge Instructions (Signed)
Seizure, Adult °A seizure is abnormal electrical activity in the brain. Seizures usually last from 30 seconds to 2 minutes. There are various types of seizures. °Before a seizure, you may have a warning sensation (aura) that a seizure is about to occur. An aura may include the following symptoms:  °· Fear or anxiety. °· Nausea. °· Feeling like the room is spinning (vertigo). °· Vision changes, such as seeing flashing lights or spots. °Common symptoms during a seizure include: °· A change in attention or behavior (altered mental status). °· Convulsions with rhythmic jerking movements. °· Drooling. °· Rapid eye movements. °· Grunting. °· Loss of bladder and bowel control. °· Bitter taste in the mouth. °· Tongue biting. °After a seizure, you may feel confused and sleepy. You may also have an injury resulting from convulsions during the seizure. °HOME CARE INSTRUCTIONS  °· If you are given medicines, take them exactly as prescribed by your health care provider. °· Keep all follow-up appointments as directed by your health care provider. °· Do not swim or drive or engage in risky activity during which a seizure could cause further injury to you or others until your health care provider says it is OK. °· Get adequate rest. °· Teach friends and family what to do if you have a seizure. They should: °¨ Lay you on the ground to prevent a fall. °¨ Put a cushion under your head. °¨ Loosen any tight clothing around your neck. °¨ Turn you on your side. If vomiting occurs, this helps keep your airway clear. °¨ Stay with you until you recover. °¨ Know whether or not you need emergency care. °SEEK IMMEDIATE MEDICAL CARE IF: °· The seizure lasts longer than 5 minutes. °· The seizure is severe or you do not wake up immediately after the seizure. °· You have an altered mental status after the seizure. °· You are having more frequent or worsening seizures. °Someone should drive you to the emergency department or call local emergency  services (911 in [place] °MAKE SURE YOU: °· Understand these instructions. °· Will watch your condition. °· Will get help right away if you are not doing well or get worse. °Document Released: 07/22/2000 Document Revised: 05/15/2013 Document Reviewed: 03/06/2013 °ExitCare® Patient Information ©2015 [employer]. This information is not intended to replace advice given to you by your health care provider. Make sure you discuss any questions you have with your health care provider. ° °Epilepsy °Epilepsy is a disorder in which a person has repeated seizures over time. A seizure is a release of abnormal electrical activity in the brain. Seizures can cause a change in attention, behavior, or the ability to remain awake and alert (altered mental status). Seizures often involve uncontrollable shaking (convulsions).  °Most people with epilepsy lead normal lives. However, people with epilepsy are at an increased risk of falls, accidents, and injuries. Therefore, it is important to begin treatment right away. °CAUSES  °Epilepsy has many possible causes. Anything that disturbs the normal pattern of brain cell activity can lead to seizures. This may include:  °· Head injury. °· Birth trauma. °· High fever as a child. °· Stroke. °· Bleeding into or around the brain. °· Certain drugs. °· Prolonged low oxygen, such as what occurs after CPR efforts. °· Abnormal brain development. °· Certain illnesses, such as meningitis, encephalitis (brain infection), malaria, and other infections. °· An imbalance of nerve signaling chemicals (neurotransmitters).   °SIGNS AND SYMPTOMS  °The symptoms of a seizure can vary greatly from one person   to another. Right before a seizure, you may have a warning (aura) that a seizure is about to occur. An aura may include the following symptoms: °· Fear or anxiety. °· Nausea. °· Feeling like the room is spinning (vertigo). °· Vision changes, such as seeing flashing lights or spots. °Common symptoms during a  seizure include: °· Abnormal sensations, such as an abnormal smell or a bitter taste in the mouth.   °· Sudden, general body stiffness.   °· Convulsions that involve rhythmic jerking of the face, arm, or leg on one or both sides.   °· Sudden change in consciousness.   °¨ Appearing to be awake but not responding.   °¨ Appearing to be asleep but cannot be awakened.   °· Grimacing, chewing, lip smacking, drooling, tongue biting, or loss of bowel or bladder control. °After a seizure, you may feel sleepy for a while.  °DIAGNOSIS  °Your health care provider will ask about your symptoms and take a medical history. Descriptions from any witnesses to your seizures will be very helpful in the diagnosis. A physical exam, including a detailed neurological exam, is necessary. Various tests may be done, such as:  °· An electroencephalogram (EEG). This is a painless test of your brain waves. In this test, a diagram is created of your brain waves. These diagrams can be interpreted by a specialist. °· An MRI of the brain.   °· A CT scan of the brain.   °· A spinal tap (lumbar puncture, LP). °· Blood tests to check for signs of infection or abnormal blood chemistry. °TREATMENT  °There is no cure for epilepsy, but it is generally treatable. Once epilepsy is diagnosed, it is important to begin treatment as soon as possible. For most people with epilepsy, seizures can be controlled with medicines. The following may also be used: °· A pacemaker for the brain (vagus nerve stimulator) can be used for people with seizures that are not well controlled by medicine. °· Surgery on the brain. °For some people, epilepsy eventually goes away. °HOME CARE INSTRUCTIONS  °· Follow your health care provider's recommendations on driving and safety in normal activities. °· Get enough rest. Lack of sleep can cause seizures. °· Only take over-the-counter or prescription medicines as directed by your health care provider. Take any prescribed medicine  exactly as directed. °· Avoid any known triggers of your seizures. °· Keep a seizure diary. Record what you recall about any seizure, especially any possible trigger.   °· Make sure the people you live and work with know that you are prone to seizures. They should receive instructions on how to help you. In general, a witness to a seizure should:   °¨ Cushion your head and body.   °¨ Turn you on your side.   °¨ Avoid unnecessarily restraining you.   °¨ Not place anything inside your mouth.   °¨ Call for emergency medical help if there is any question about what has occurred.   °· Follow up with your health care provider as directed. You may need regular blood tests to monitor the levels of your medicine.   °SEEK MEDICAL CARE IF:  °· You develop signs of infection or other illness. This might increase the risk of a seizure.   °· You seem to be having more frequent seizures.   °· Your seizure pattern is changing.   °SEEK IMMEDIATE MEDICAL CARE IF:  °· You have a seizure that does not stop after a few moments.   °· You have a seizure that causes any difficulty in breathing.   °· You have a seizure that results in a   very severe headache.   °· You have a seizure that leaves you with the inability to speak or use a part of your body.   °Document Released: 07/25/2005 Document Revised: 05/15/2013 Document Reviewed: 03/06/2013 °ExitCare® Patient Information ©2015 [employer]. This information is not intended to replace advice given to you by your health care provider. Make sure you discuss any questions you have with your health care provider. ° °
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
## 2014-07-16 ENCOUNTER — Telehealth: Payer: Self-pay | Admitting: *Deleted

## 2014-07-16 ENCOUNTER — Ambulatory Visit: Payer: 59 | Admitting: Neurology

## 2014-07-16 NOTE — Telephone Encounter (Signed)
Left message for patient to contact office.

## 2014-07-16 NOTE — Telephone Encounter (Signed)
Continue the Keppra 1000mg  twice daily. We will titrate up on the Tegretol XR: 200mg  in morning and 400mg  at bedtime for 7 days, then 400mg  twice daily. After being on the 400mg  twice daily for one week, we will check a trough level

## 2014-07-16 NOTE — Telephone Encounter (Signed)
Patient states he is taking keppra 1000 mg bid and tegretol 400mg  xr 1 po HS

## 2014-07-16 NOTE — Telephone Encounter (Signed)
Continue the Keppra 1000mg  twice daily.  We will titrate up on the Tegretol XR:  200mg  in morning and 400mg  at bedtime for 7 days, then 400mg  twice daily.  After being on the 400mg  twice daily for one week, we will check a trough level.

## 2014-07-25 ENCOUNTER — Emergency Department (HOSPITAL_COMMUNITY)
Admission: EM | Admit: 2014-07-25 | Discharge: 2014-07-25 | Disposition: A | Discharge status: Home / Self Care | Payer: No Typology Code available for payment source | Attending: Emergency Medicine | Admitting: Emergency Medicine

## 2014-07-25 ENCOUNTER — Encounter (HOSPITAL_COMMUNITY): Payer: Self-pay | Admitting: Emergency Medicine

## 2014-07-25 DIAGNOSIS — Z79899 Other long term (current) drug therapy: Secondary | ICD-10-CM | POA: Insufficient documentation

## 2014-07-25 DIAGNOSIS — I10 Essential (primary) hypertension: Secondary | ICD-10-CM | POA: Insufficient documentation

## 2014-07-25 DIAGNOSIS — Z87820 Personal history of traumatic brain injury: Secondary | ICD-10-CM | POA: Insufficient documentation

## 2014-07-25 DIAGNOSIS — G40909 Epilepsy, unspecified, not intractable, without status epilepticus: Secondary | ICD-10-CM | POA: Insufficient documentation

## 2014-07-25 DIAGNOSIS — Z87891 Personal history of nicotine dependence: Secondary | ICD-10-CM | POA: Insufficient documentation

## 2014-07-25 DIAGNOSIS — R569 Unspecified convulsions: Secondary | ICD-10-CM

## 2014-07-25 DIAGNOSIS — Z8619 Personal history of other infectious and parasitic diseases: Secondary | ICD-10-CM | POA: Insufficient documentation

## 2014-07-25 DIAGNOSIS — F329 Major depressive disorder, single episode, unspecified: Secondary | ICD-10-CM | POA: Insufficient documentation

## 2014-07-25 DIAGNOSIS — G43909 Migraine, unspecified, not intractable, without status migrainosus: Secondary | ICD-10-CM | POA: Insufficient documentation

## 2014-07-25 DIAGNOSIS — R000 Tachycardia, unspecified: Secondary | ICD-10-CM | POA: Insufficient documentation

## 2014-07-25 DIAGNOSIS — Z87828 Personal history of other (healed) physical injury and trauma: Secondary | ICD-10-CM | POA: Insufficient documentation

## 2014-07-25 LAB — BASIC METABOLIC PANEL
ANION GAP: 30 — AB (ref 5–15)
BUN: 11 mg/dL (ref 6–23)
CALCIUM: 10.1 mg/dL (ref 8.4–10.5)
CO2: 14 meq/L — AB (ref 19–32)
Chloride: 99 mEq/L (ref 96–112)
Creatinine, Ser: 1.33 mg/dL (ref 0.50–1.35)
GFR calc Af Amer: 84 mL/min — ABNORMAL LOW (ref >90)
GFR, EST NON AFRICAN AMERICAN: 73 mL/min — AB (ref >90)
Glucose, Bld: 124 mg/dL — ABNORMAL HIGH (ref 70–99)
POTASSIUM: 3.8 meq/L (ref 3.7–5.3)
Sodium: 143 mEq/L (ref 137–147)

## 2014-07-25 LAB — CBC
HCT: 47 % (ref 39.0–52.0)
HEMOGLOBIN: 15.5 g/dL (ref 13.0–17.0)
MCH: 28.1 pg (ref 26.0–34.0)
MCHC: 33 g/dL (ref 30.0–36.0)
MCV: 85.1 fL (ref 78.0–100.0)
Platelets: 277 10*3/uL (ref 150–400)
RBC: 5.52 MIL/uL (ref 4.22–5.81)
RDW: 13.1 % (ref 11.5–15.5)
WBC: 7 10*3/uL (ref 4.0–10.5)

## 2014-07-25 MED ORDER — SODIUM CHLORIDE 0.9 % IV BOLUS (SEPSIS)
1000.0000 mL | Freq: Once | INTRAVENOUS | Status: AC
  Administered 2014-07-25: 1000 mL via INTRAVENOUS

## 2014-07-25 MED ORDER — LORAZEPAM 2 MG/ML IJ SOLN
1.0000 mg | Freq: Once | INTRAMUSCULAR | Status: AC
  Administered 2014-07-25: 1 mg via INTRAVENOUS
  Filled 2014-07-25: qty 1

## 2014-07-25 NOTE — ED Notes (Signed)
Per EMS-had 2 witnessed seizures at barbershop, post ictal when EMS arrived-recently got meds filled and resumed taking them this am-doesn't know how long he has been off meds
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
## 2014-07-25 NOTE — ED Notes (Signed)
Bed: WA16 Expected date:  Expected time:  Means of arrival:  Comments: EMS SZ 

## 2014-07-25 NOTE — Discharge Instructions (Signed)

## 2014-08-20 NOTE — Telephone Encounter (Signed)
error 

## 2014-08-22 ENCOUNTER — Ambulatory Visit: Payer: No Typology Code available for payment source | Admitting: Neurology

## 2014-08-27 ENCOUNTER — Ambulatory Visit: Payer: No Typology Code available for payment source | Admitting: Neurology

## 2014-08-28 ENCOUNTER — Telehealth: Payer: Self-pay | Admitting: Neurology

## 2014-08-28 ENCOUNTER — Telehealth: Payer: Self-pay | Admitting: *Deleted

## 2014-08-28 DIAGNOSIS — Z029 Encounter for administrative examinations, unspecified: Secondary | ICD-10-CM

## 2014-08-28 NOTE — Telephone Encounter (Signed)
See previous message. FYI - pt no showed 08/27/14 appt w/ Dr. Tomi Likens. No show letter mailed to pt + copy of no show policy / Sherri S.

## 2014-08-28 NOTE — Telephone Encounter (Signed)
[facility]ppra was called to [facility] per pateint request no refills willbe given he has not been see in several months  he is aware

## 2014-08-28 NOTE — Telephone Encounter (Signed)
Pt needs refill on keprra pt phone number is [phone]

## 2014-09-19 ENCOUNTER — Ambulatory Visit: Payer: No Typology Code available for payment source | Admitting: Neurology

## 2014-09-19 DIAGNOSIS — Z029 Encounter for administrative examinations, unspecified: Secondary | ICD-10-CM

## 2014-09-24 ENCOUNTER — Telehealth: Payer: Self-pay | Admitting: Neurology

## 2014-09-24 NOTE — Telephone Encounter (Signed)
Pt no showed 2//12/16 appt w/ Dr. Tomi Likens. No show letter + policy mailed to pt / Sherri S.

## 2014-09-25 ENCOUNTER — Encounter: Payer: Self-pay | Admitting: *Deleted

## 2014-10-24 ENCOUNTER — Emergency Department (HOSPITAL_BASED_OUTPATIENT_CLINIC_OR_DEPARTMENT_OTHER)
Admission: EM | Admit: 2014-10-24 | Discharge: 2014-10-25 | Disposition: A | Discharge status: Home / Self Care | Payer: 59 | Attending: Emergency Medicine | Admitting: Emergency Medicine

## 2014-10-24 ENCOUNTER — Encounter (HOSPITAL_BASED_OUTPATIENT_CLINIC_OR_DEPARTMENT_OTHER): Payer: Self-pay | Admitting: Emergency Medicine

## 2014-10-24 DIAGNOSIS — Z79899 Other long term (current) drug therapy: Secondary | ICD-10-CM | POA: Diagnosis not present

## 2014-10-24 DIAGNOSIS — G43909 Migraine, unspecified, not intractable, without status migrainosus: Secondary | ICD-10-CM | POA: Insufficient documentation

## 2014-10-24 DIAGNOSIS — G40909 Epilepsy, unspecified, not intractable, without status epilepticus: Secondary | ICD-10-CM | POA: Insufficient documentation

## 2014-10-24 DIAGNOSIS — Z8619 Personal history of other infectious and parasitic diseases: Secondary | ICD-10-CM | POA: Insufficient documentation

## 2014-10-24 DIAGNOSIS — I10 Essential (primary) hypertension: Secondary | ICD-10-CM | POA: Diagnosis not present

## 2014-10-24 DIAGNOSIS — Z87891 Personal history of nicotine dependence: Secondary | ICD-10-CM | POA: Insufficient documentation

## 2014-10-24 DIAGNOSIS — H538 Other visual disturbances: Secondary | ICD-10-CM | POA: Diagnosis present

## 2014-10-24 DIAGNOSIS — Z87828 Personal history of other (healed) physical injury and trauma: Secondary | ICD-10-CM | POA: Insufficient documentation

## 2014-10-24 DIAGNOSIS — F329 Major depressive disorder, single episode, unspecified: Secondary | ICD-10-CM | POA: Diagnosis not present
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
## 2014-10-24 NOTE — ED Notes (Signed)
Pt sts hx of seizure; has been having vision changes, extremity changes; concerned about onset of seizure; Takes Keppra- compliant

## 2014-10-25 ENCOUNTER — Encounter (HOSPITAL_BASED_OUTPATIENT_CLINIC_OR_DEPARTMENT_OTHER): Payer: Self-pay | Admitting: Emergency Medicine

## 2014-10-25 LAB — COMPREHENSIVE METABOLIC PANEL
ALK PHOS: 98 U/L (ref 39–117)
ALT: 29 U/L (ref 0–53)
ANION GAP: 10 (ref 5–15)
AST: 44 U/L — AB (ref 0–37)
Albumin: 4.8 g/dL (ref 3.5–5.2)
BILIRUBIN TOTAL: 0.5 mg/dL (ref 0.3–1.2)
BUN: 8 mg/dL (ref 6–23)
CHLORIDE: 103 mmol/L (ref 96–112)
CO2: 27 mmol/L (ref 19–32)
Calcium: 9.4 mg/dL (ref 8.4–10.5)
Creatinine, Ser: 1.15 mg/dL (ref 0.50–1.35)
GFR calc Af Amer: >90 mL/min (ref >90)
GFR calc non Af Amer: 87 mL/min — ABNORMAL LOW (ref >90)
Glucose, Bld: 93 mg/dL (ref 70–99)
Potassium: 3.2 mmol/L — ABNORMAL LOW (ref 3.5–5.1)
Sodium: 140 mmol/L (ref 135–145)
Total Protein: 8.7 g/dL — ABNORMAL HIGH (ref 6.0–8.3)

## 2014-10-28 ENCOUNTER — Encounter: Payer: Self-pay | Admitting: Neurology

## 2014-10-28 ENCOUNTER — Ambulatory Visit (INDEPENDENT_AMBULATORY_CARE_PROVIDER_SITE_OTHER): Payer: 59 | Admitting: Neurology

## 2014-10-28 VITALS — BP 136/78 | HR 88 | Temp 98.2°F | Resp 16 | Ht 73.0 in | Wt 217.9 lb

## 2014-10-28 DIAGNOSIS — R6889 Other general symptoms and signs: Secondary | ICD-10-CM

## 2014-10-28 DIAGNOSIS — IMO0001 Reserved for inherently not codable concepts without codable children: Secondary | ICD-10-CM

## 2014-10-28 DIAGNOSIS — R569 Unspecified convulsions: Secondary | ICD-10-CM

## 2014-10-28 DIAGNOSIS — G40209 Localization-related (focal) (partial) symptomatic epilepsy and epileptic syndromes with complex partial seizures, not intractable, without status epilepticus: Secondary | ICD-10-CM

## 2014-10-28 MED ORDER — CARBAMAZEPINE ER 200 MG PO TB12: ORAL_TABLET | ORAL | Status: DC
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
## 2014-10-28 NOTE — Addendum Note (Signed)
Addended by: Charyl Bigger E on: 10/28/2014 03:47 PM   Modules accepted: Orders

## 2014-10-28 NOTE — Patient Instructions (Signed)
1.  Continue Keppra 1000mg  twice daily 2.  We will restart Tegretol XR 200mg  to treat these new spells.  Take 1 tablet twice daily for 7 days, then 2 tablets twice daily.   3.  After taking the Tegretol 2 tablets twice daily for seven days, I want to check a level. 4.  Call in 4 weeks with update.  If episodes continue, we may need to consider an ambulatory EEG. 5.  Follow up in 3 months.

## 2014-11-04 ENCOUNTER — Telehealth: Payer: Self-pay | Admitting: General Practice

## 2014-11-04 NOTE — Telephone Encounter (Signed)
-----   Message from Midge Minium, MD sent at 11/02/2014  4:24 PM EDT ----- Regarding: FW: new patient  [place] w/ me- please convert to phone note   ----- Message -----    From: Elveria Royals    Sent: 10/30/2014   2:47 PM      To: Midge Minium, MD Subject: new patient                                    This patient is 31.  He is a patient of Dr Tomi Likens.  Dr Tomi Likens would like you to accept this patient.  He has uhc

## 2014-11-04 NOTE — Telephone Encounter (Signed)
Please call and scheduled this new pt.

## 2014-11-04 NOTE — Telephone Encounter (Signed)
Left detailed message informing patient to call and schedule new patient appointment

## 2014-11-14 ENCOUNTER — Other Ambulatory Visit: Payer: Self-pay | Admitting: *Deleted

## 2014-11-14 ENCOUNTER — Telehealth: Payer: Self-pay | Admitting: Neurology

## 2014-11-14 DIAGNOSIS — R569 Unspecified convulsions: Secondary | ICD-10-CM

## 2014-11-14 MED ORDER — LEVETIRACETAM 1000 MG PO TABS: 1000.0000 mg | ORAL_TABLET | Freq: Two times a day (BID) | ORAL | Status: DC

## 2014-11-14 NOTE — Telephone Encounter (Signed)
Pt needs to a refill on Keppra called into [facility] on [address] pt phone number is [phone]

## 2014-11-14 NOTE — Telephone Encounter (Signed)
Keppra 1000 mg 1 po bid call to [facility]

## 2014-11-15 LAB — CARBAMAZEPINE LEVEL, TOTAL: Carbamazepine Lvl: 9 ug/mL (ref 4.0–12.0)

## 2014-11-17 ENCOUNTER — Telehealth: Payer: Self-pay | Admitting: *Deleted

## 2014-11-17 NOTE — Telephone Encounter (Signed)
-----   Message from Pieter Partridge, DO sent at 11/17/2014  7:07 AM EDT ----- Tegretol level is okay ----- Message -----    From: [facility]    Sent: 11/15/2014   7:57 AM      To: Pieter Partridge, DO

## 2014-11-17 NOTE — Telephone Encounter (Signed)
Patient is aware that his Tegretol level is okay

## 2014-12-08 ENCOUNTER — Ambulatory Visit (HOSPITAL_BASED_OUTPATIENT_CLINIC_OR_DEPARTMENT_OTHER)
Admission: RE | Admit: 2014-12-08 | Discharge: 2014-12-08 | Disposition: A | Discharge status: Home / Self Care | Payer: 59 | Source: Ambulatory Visit | Attending: Medical | Admitting: Medical

## 2014-12-08 ENCOUNTER — Encounter: Payer: Self-pay | Admitting: Medical

## 2014-12-08 ENCOUNTER — Ambulatory Visit (INDEPENDENT_AMBULATORY_CARE_PROVIDER_SITE_OTHER): Payer: 59 | Admitting: Medical

## 2014-12-08 VITALS — BP 138/93 | HR 81 | Temp 98.2°F | Ht 73.0 in | Wt 214.6 lb

## 2014-12-08 DIAGNOSIS — R197 Diarrhea, unspecified: Secondary | ICD-10-CM | POA: Diagnosis not present

## 2014-12-08 DIAGNOSIS — R1084 Generalized abdominal pain: Secondary | ICD-10-CM | POA: Diagnosis not present

## 2014-12-08 DIAGNOSIS — R109 Unspecified abdominal pain: Secondary | ICD-10-CM | POA: Insufficient documentation

## 2014-12-08 LAB — COMPREHENSIVE METABOLIC PANEL
ALK PHOS: 102 U/L (ref 39–117)
ALT: 23 U/L (ref 0–53)
AST: 20 U/L (ref 0–37)
Albumin: 4.2 g/dL (ref 3.5–5.2)
BUN: 9 mg/dL (ref 6–23)
CO2: 31 mEq/L (ref 19–32)
Calcium: 9.3 mg/dL (ref 8.4–10.5)
Chloride: 102 mEq/L (ref 96–112)
Creatinine, Ser: 1.28 mg/dL (ref 0.40–1.50)
GFR: 86.73 mL/min (ref >60.00)
GLUCOSE: 94 mg/dL (ref 70–99)
POTASSIUM: 3.8 meq/L (ref 3.5–5.1)
Sodium: 139 mEq/L (ref 135–145)
TOTAL PROTEIN: 7.9 g/dL (ref 6.0–8.3)
Total Bilirubin: 0.3 mg/dL (ref 0.2–1.2)

## 2014-12-08 LAB — CBC WITH DIFFERENTIAL/PLATELET
BASOS ABS: 0 10*3/uL (ref 0.0–0.1)
Basophils Relative: 0.5 % (ref 0.0–3.0)
EOS PCT: 3.8 % (ref 0.0–5.0)
Eosinophils Absolute: 0.2 10*3/uL (ref 0.0–0.7)
HEMATOCRIT: 46.7 % (ref 39.0–52.0)
Hemoglobin: 15.7 g/dL (ref 13.0–17.0)
Lymphocytes Relative: 32.5 % (ref 12.0–46.0)
Lymphs Abs: 1.5 10*3/uL (ref 0.7–4.0)
MCHC: 33.7 g/dL (ref 30.0–36.0)
MCV: 83.8 fl (ref 78.0–100.0)
MONOS PCT: 11.7 % (ref 3.0–12.0)
Monocytes Absolute: 0.5 10*3/uL (ref 0.1–1.0)
Neutro Abs: 2.4 10*3/uL (ref 1.4–7.7)
Neutrophils Relative %: 51.5 % (ref 43.0–77.0)
Platelets: 226 10*3/uL (ref 150.0–400.0)
RBC: 5.57 Mil/uL (ref 4.22–5.81)
RDW: 13.9 % (ref 11.5–15.5)
WBC: 4.6 10*3/uL (ref 4.0–10.5)

## 2014-12-08 NOTE — Assessment & Plan Note (Signed)
Daily episodes with severe urge to have bm daily for one year. This is gradually worsening. Numeorous surgeries in past.  I would recommend metamucil 1 rounded tablespoon in 8 oz of water 3 times a day. Xray abdomen today with cbc anc cmp.  I will go ahead and send you to GI as well. With your condition I want specialist opinion as  To whether or not can be on stronger meds.

## 2014-12-08 NOTE — Patient Instructions (Signed)
Abdominal pain Daily episodes with severe urge to have bm daily for one year. This is gradually worsening. Numeorous surgeries in past.  I would recommend metamucil 1 rounded tablespoon in 8 oz of water 3 times a day. Xray abdomen today with cbc anc cmp.  I will go ahead and send you to GI as well. With your condition I want specialist opinion as  To whether or not can be on stronger meds.     Follow up in one month or as needed. If symptoms not improved or worsening notify us. Severe abdomen signs or symptoms then ED evaluation.
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
## 2014-12-08 NOTE — Progress Notes (Signed)
Pre visit review using our clinic review tool, if applicable. No additional management support is needed unless otherwise documented below in the visit note. 

## 2014-12-15 ENCOUNTER — Emergency Department (HOSPITAL_COMMUNITY)
Admission: EM | Admit: 2014-12-15 | Discharge: 2014-12-15 | Discharge status: Against Medical Advice | Payer: 59 | Attending: Emergency Medicine | Admitting: Emergency Medicine

## 2014-12-15 ENCOUNTER — Encounter (HOSPITAL_COMMUNITY): Payer: Self-pay | Admitting: Emergency Medicine

## 2014-12-15 DIAGNOSIS — G40909 Epilepsy, unspecified, not intractable, without status epilepticus: Secondary | ICD-10-CM | POA: Diagnosis not present

## 2014-12-15 DIAGNOSIS — I10 Essential (primary) hypertension: Secondary | ICD-10-CM | POA: Insufficient documentation

## 2014-12-15 NOTE — ED Notes (Signed)
Pt updated on current ED wait time 
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
## 2014-12-15 NOTE — ED Notes (Signed)
Pt returned to [facility] & states, "I am going to 68. I cannot wait."

## 2014-12-22 ENCOUNTER — Encounter: Payer: Self-pay | Admitting: Gastroenterology

## 2014-12-24 ENCOUNTER — Other Ambulatory Visit: Payer: Self-pay | Admitting: Neurology

## 2014-12-25 ENCOUNTER — Emergency Department (HOSPITAL_COMMUNITY)
Admission: EM | Admit: 2014-12-25 | Discharge: 2014-12-26 | Disposition: A | Discharge status: Home / Self Care | Payer: 59 | Attending: Emergency Medicine | Admitting: Emergency Medicine

## 2014-12-25 ENCOUNTER — Encounter (HOSPITAL_COMMUNITY): Payer: Self-pay | Admitting: Emergency Medicine

## 2014-12-25 ENCOUNTER — Emergency Department (HOSPITAL_COMMUNITY): Payer: 59

## 2014-12-25 DIAGNOSIS — W01198A Fall on same level from slipping, tripping and stumbling with subsequent striking against other object, initial encounter: Secondary | ICD-10-CM | POA: Insufficient documentation

## 2014-12-25 DIAGNOSIS — S0101XA Laceration without foreign body of scalp, initial encounter: Secondary | ICD-10-CM

## 2014-12-25 DIAGNOSIS — Z87891 Personal history of nicotine dependence: Secondary | ICD-10-CM | POA: Diagnosis not present

## 2014-12-25 DIAGNOSIS — I10 Essential (primary) hypertension: Secondary | ICD-10-CM | POA: Diagnosis not present

## 2014-12-25 DIAGNOSIS — Y990 Civilian activity done for income or pay: Secondary | ICD-10-CM | POA: Insufficient documentation

## 2014-12-25 DIAGNOSIS — F329 Major depressive disorder, single episode, unspecified: Secondary | ICD-10-CM | POA: Diagnosis not present

## 2014-12-25 DIAGNOSIS — Z79899 Other long term (current) drug therapy: Secondary | ICD-10-CM | POA: Insufficient documentation

## 2014-12-25 DIAGNOSIS — Z9119 Patient's noncompliance with other medical treatment and regimen: Secondary | ICD-10-CM | POA: Insufficient documentation

## 2014-12-25 DIAGNOSIS — S0990XA Unspecified injury of head, initial encounter: Secondary | ICD-10-CM | POA: Diagnosis present

## 2014-12-25 DIAGNOSIS — Z8619 Personal history of other infectious and parasitic diseases: Secondary | ICD-10-CM | POA: Diagnosis not present

## 2014-12-25 DIAGNOSIS — Y929 Unspecified place or not applicable: Secondary | ICD-10-CM | POA: Insufficient documentation

## 2014-12-25 DIAGNOSIS — R569 Unspecified convulsions: Secondary | ICD-10-CM

## 2014-12-25 DIAGNOSIS — Y939 Activity, unspecified: Secondary | ICD-10-CM | POA: Diagnosis not present

## 2014-12-25 DIAGNOSIS — G40909 Epilepsy, unspecified, not intractable, without status epilepticus: Secondary | ICD-10-CM | POA: Diagnosis not present

## 2014-12-25 LAB — CBC WITH DIFFERENTIAL/PLATELET
BASOS ABS: 0 10*3/uL (ref 0.0–0.1)
Basophils Relative: 0 % (ref 0–1)
EOS ABS: 0.2 10*3/uL (ref 0.0–0.7)
EOS PCT: 2 % (ref 0–5)
HCT: 46.5 % (ref 39.0–52.0)
HEMOGLOBIN: 15.9 g/dL (ref 13.0–17.0)
LYMPHS ABS: 2.8 10*3/uL (ref 0.7–4.0)
LYMPHS PCT: 41 % (ref 12–46)
MCH: 28.6 pg (ref 26.0–34.0)
MCHC: 34.2 g/dL (ref 30.0–36.0)
MCV: 83.8 fL (ref 78.0–100.0)
Monocytes Absolute: 0.6 10*3/uL (ref 0.1–1.0)
Monocytes Relative: 9 % (ref 3–12)
NEUTROS PCT: 48 % (ref 43–77)
Neutro Abs: 3.3 10*3/uL (ref 1.7–7.7)
PLATELETS: 234 10*3/uL (ref 150–400)
RBC: 5.55 MIL/uL (ref 4.22–5.81)
RDW: 12.8 % (ref 11.5–15.5)
WBC: 6.8 10*3/uL (ref 4.0–10.5)

## 2014-12-25 LAB — COMPREHENSIVE METABOLIC PANEL
ALBUMIN: 4.5 g/dL (ref 3.5–5.0)
ALK PHOS: 99 U/L (ref 38–126)
ALT: 21 U/L (ref 17–63)
ANION GAP: 18 — AB (ref 5–15)
AST: 30 U/L (ref 15–41)
BUN: 11 mg/dL (ref 6–20)
CALCIUM: 9.9 mg/dL (ref 8.9–10.3)
CO2: 20 mmol/L — ABNORMAL LOW (ref 22–32)
Chloride: 104 mmol/L (ref 101–111)
Creatinine, Ser: 1.6 mg/dL — ABNORMAL HIGH (ref 0.61–1.24)
GFR calc Af Amer: >60 mL/min (ref >60)
GFR calc non Af Amer: 58 mL/min — ABNORMAL LOW (ref >60)
Glucose, Bld: 86 mg/dL (ref 65–99)
POTASSIUM: 3.5 mmol/L (ref 3.5–5.1)
SODIUM: 142 mmol/L (ref 135–145)
TOTAL PROTEIN: 8.2 g/dL — AB (ref 6.5–8.1)
Total Bilirubin: 0.3 mg/dL (ref 0.3–1.2)

## 2014-12-25 LAB — CARBAMAZEPINE LEVEL, TOTAL

## 2014-12-25 MED ORDER — LIDOCAINE-EPINEPHRINE (PF) 2 %-1:200000 IJ SOLN
20.0000 mL | Freq: Once | INTRAMUSCULAR | Status: AC
  Administered 2014-12-25: 20 mL via INTRADERMAL
  Filled 2014-12-25: qty 20

## 2014-12-25 MED ORDER — CARBAMAZEPINE ER 200 MG PO TB12
200.0000 mg | ORAL_TABLET | Freq: Once | ORAL | Status: AC
  Administered 2014-12-26: 200 mg via ORAL
  Filled 2014-12-25: qty 1

## 2014-12-25 MED ORDER — SODIUM CHLORIDE 0.9 % IV SOLN
1000.0000 mg | Freq: Once | INTRAVENOUS | Status: AC
  Administered 2014-12-26: 1000 mg via INTRAVENOUS
  Filled 2014-12-25: qty 10

## 2014-12-25 MED ORDER — MORPHINE SULFATE 4 MG/ML IJ SOLN
4.0000 mg | Freq: Once | INTRAMUSCULAR | Status: AC
  Administered 2014-12-25: 4 mg via INTRAVENOUS
  Filled 2014-12-25: qty 1

## 2014-12-25 MED ORDER — TETANUS-DIPHTH-ACELL PERTUSSIS 5-2.5-18.5 LF-MCG/0.5 IM SUSP
0.5000 mL | Freq: Once | INTRAMUSCULAR | Status: DC
  Filled 2014-12-25: qty 0.5
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
## 2014-12-26 MED ORDER — SODIUM CHLORIDE 0.9 % IV BOLUS (SEPSIS)
1000.0000 mL | Freq: Once | INTRAVENOUS | Status: AC
  Administered 2014-12-26: 1000 mL via INTRAVENOUS

## 2014-12-26 MED ORDER — MORPHINE SULFATE 4 MG/ML IJ SOLN
4.0000 mg | Freq: Once | INTRAMUSCULAR | Status: AC
  Administered 2014-12-26: 4 mg via INTRAVENOUS
  Filled 2014-12-26: qty 1

## 2014-12-26 MED ORDER — CARBAMAZEPINE ER 200 MG PO TB12: 400.0000 mg | ORAL_TABLET | Freq: Two times a day (BID) | ORAL | Status: DC

## 2014-12-26 NOTE — Discharge Instructions (Signed)
Follow up in 1 week for staple removal.  Return to ED sooner if you have fever, purulent drainage from wound, or any other concerns

## 2014-12-29 LAB — LEVETIRACETAM LEVEL: Levetiracetam Lvl: NOT DETECTED ug/mL (ref 10.0–40.0)

## 2014-12-31 ENCOUNTER — Telehealth: Payer: Self-pay | Admitting: Internal Medicine

## 2014-12-31 NOTE — Telephone Encounter (Signed)
Called and scheduled pt for tomorrow

## 2014-12-31 NOTE — Telephone Encounter (Signed)
Please verify on the physical for pt. Seen Dakota Evans this year, no record of CPE or OV with PCP since 2012.

## 2014-12-31 NOTE — Telephone Encounter (Signed)
I have a 3:30 open CPE tomorrow.  He can come then

## 2014-12-31 NOTE — Telephone Encounter (Signed)
Relation to pt: self Call back number: [phone]  Reason for call:  Pt has a new patient appointment and in need of there physical appointment this week to return back to work. Please advise if PA can conduct physical appointment.

## 2015-01-01 ENCOUNTER — Encounter (HOSPITAL_COMMUNITY): Payer: Self-pay | Admitting: Emergency Medicine

## 2015-01-01 ENCOUNTER — Emergency Department (HOSPITAL_COMMUNITY)
Admission: EM | Admit: 2015-01-01 | Discharge: 2015-01-01 | Disposition: A | Discharge status: Home / Self Care | Payer: 59 | Attending: Emergency Medicine | Admitting: Emergency Medicine

## 2015-01-01 ENCOUNTER — Encounter: Payer: 59 | Admitting: Family Medicine

## 2015-01-01 DIAGNOSIS — I10 Essential (primary) hypertension: Secondary | ICD-10-CM | POA: Diagnosis not present

## 2015-01-01 DIAGNOSIS — Z79899 Other long term (current) drug therapy: Secondary | ICD-10-CM | POA: Insufficient documentation

## 2015-01-01 DIAGNOSIS — Z87828 Personal history of other (healed) physical injury and trauma: Secondary | ICD-10-CM | POA: Diagnosis not present

## 2015-01-01 DIAGNOSIS — Z4802 Encounter for removal of sutures: Secondary | ICD-10-CM | POA: Insufficient documentation

## 2015-01-01 DIAGNOSIS — Z8619 Personal history of other infectious and parasitic diseases: Secondary | ICD-10-CM | POA: Diagnosis not present

## 2015-01-01 DIAGNOSIS — F329 Major depressive disorder, single episode, unspecified: Secondary | ICD-10-CM | POA: Diagnosis not present

## 2015-01-01 DIAGNOSIS — G40909 Epilepsy, unspecified, not intractable, without status epilepticus: Secondary | ICD-10-CM | POA: Insufficient documentation

## 2015-01-01 NOTE — Discharge Instructions (Signed)
Keep wound area clean. Refer to attached documents for more information.  °
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
## 2015-01-01 NOTE — ED Notes (Signed)
Patient here to have staples removed from R side of head.   Patient denies any problems.   Area free of s/s of infections.

## 2015-01-07 ENCOUNTER — Emergency Department (INDEPENDENT_AMBULATORY_CARE_PROVIDER_SITE_OTHER)
Admission: EM | Admit: 2015-01-07 | Discharge: 2015-01-07 | Disposition: A | Discharge status: Home / Self Care | Payer: 59 | Source: Home / Self Care | Attending: Emergency Medicine | Admitting: Emergency Medicine

## 2015-01-07 ENCOUNTER — Encounter (HOSPITAL_COMMUNITY): Payer: Self-pay | Admitting: Emergency Medicine

## 2015-01-07 DIAGNOSIS — G40909 Epilepsy, unspecified, not intractable, without status epilepticus: Secondary | ICD-10-CM

## 2015-01-07 NOTE — Discharge Instructions (Signed)
Driving and Equipment Restrictions °Some medical problems make it dangerous to drive, ride a bike, or use machines. Some of these problems are: °· A hard blow to the head (concussion). °· Passing out (fainting). °· Twitching and shaking (seizures). °· Low blood sugar. °· Taking medicine to help you relax (sedatives). °· Taking pain medicines. °· Wearing an eye patch. °· Wearing splints. This can make it hard to use parts of your body that you need to drive safely. °HOME CARE  °· Do not drive until your doctor says it is okay. °· Do not use machines until your doctor says it is okay. °You may need a form signed by your doctor (medical release) before you can drive again. You may also need this form before you do other tasks where you need to be fully alert. °MAKE SURE YOU: °· Understand these instructions. °· Will watch your condition. °· Will get help right away if you are not doing well or get worse. °Document Released: 09/01/2004 Document Revised: 10/17/2011 Document Reviewed: 12/02/2009 °ExitCare® Patient Information ©2015 [employer]. This information is not intended to replace advice given to you by your health care provider. Make sure you discuss any questions you have with your health care provider. ° °
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
## 2015-01-07 NOTE — ED Notes (Signed)
Here for re-evaluation Patient fell and hit head when he had a seizure States he needs note for return to work

## 2015-01-08 ENCOUNTER — Encounter: Payer: Self-pay | Admitting: Medical

## 2015-01-08 ENCOUNTER — Ambulatory Visit (INDEPENDENT_AMBULATORY_CARE_PROVIDER_SITE_OTHER): Payer: 59 | Admitting: Medical

## 2015-01-08 VITALS — BP 124/80 | HR 77 | Temp 98.2°F | Ht 73.0 in | Wt 210.8 lb

## 2015-01-08 DIAGNOSIS — R1084 Generalized abdominal pain: Secondary | ICD-10-CM

## 2015-01-08 DIAGNOSIS — R569 Unspecified convulsions: Secondary | ICD-10-CM | POA: Diagnosis not present

## 2015-01-08 LAB — COMPREHENSIVE METABOLIC PANEL
ALT: 26 U/L (ref 0–53)
AST: 22 U/L (ref 0–37)
Albumin: 4.8 g/dL (ref 3.5–5.2)
Alkaline Phosphatase: 109 U/L (ref 39–117)
BUN: 10 mg/dL (ref 6–23)
CHLORIDE: 101 meq/L (ref 96–112)
CO2: 31 mEq/L (ref 19–32)
CREATININE: 1.26 mg/dL (ref 0.40–1.50)
Calcium: 9.6 mg/dL (ref 8.4–10.5)
GFR: 88.27 mL/min (ref >60.00)
GLUCOSE: 101 mg/dL — AB (ref 70–99)
POTASSIUM: 3.8 meq/L (ref 3.5–5.1)
Sodium: 138 mEq/L (ref 135–145)
TOTAL PROTEIN: 8.3 g/dL (ref 6.0–8.3)
Total Bilirubin: 0.4 mg/dL (ref 0.2–1.2)

## 2015-01-08 NOTE — Progress Notes (Signed)
Pre visit review using our clinic review tool, if applicable. No additional management support is needed unless otherwise documented below in the visit note. 

## 2015-01-08 NOTE — Assessment & Plan Note (Signed)
Prior symptoms much better with metamucil. He does have upcoming appointment with GI this summer. Advise him to keep that appoitment.
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
## 2015-01-08 NOTE — Patient Instructions (Signed)
Seizure Will try to get referral staff to call Dr. Tomi Likens neurologist office to see if they can see pt sooner. Since he having nausea episodes with sensation like he may have seizures but then then does not. I question whether this premonition type sensation maybe abscence type??  Will try to order level of seizure meds today.   Abdominal pain Prior symptoms much better with metamucil. He does have upcoming appointment with GI this summer. Advise him to keep that appoitment.    Follow up in 7-10 days or as needed.(If we are unable to get neuro referral sooner)  Will call you on seizure med levels and kidney function levels.

## 2015-01-08 NOTE — Assessment & Plan Note (Signed)
Will try to get referral staff to call Dr. Tomi Likens neurologist office to see if they can see pt sooner. Since he having nausea episodes with sensation like he may have seizures but then then does not. I question whether this premonition type sensation maybe abscence type??  Will try to order level of seizure meds today.

## 2015-01-09 LAB — CARBAMAZEPINE LEVEL, TOTAL: Carbamazepine Lvl: 8.4 ug/mL (ref 4.0–12.0)

## 2015-01-11 LAB — LEVETIRACETAM LEVEL: KEPPRA (LEVETIRACETAM): 23.8 ug/mL

## 2015-01-13 ENCOUNTER — Encounter: Payer: Self-pay | Admitting: *Deleted

## 2015-01-13 ENCOUNTER — Encounter: Payer: Self-pay | Admitting: Neurology

## 2015-01-13 ENCOUNTER — Ambulatory Visit (INDEPENDENT_AMBULATORY_CARE_PROVIDER_SITE_OTHER): Payer: 59 | Admitting: Neurology

## 2015-01-13 VITALS — BP 128/84 | HR 74 | Ht 73.0 in | Wt 208.1 lb

## 2015-01-13 DIAGNOSIS — G40209 Localization-related (focal) (partial) symptomatic epilepsy and epileptic syndromes with complex partial seizures, not intractable, without status epilepticus: Secondary | ICD-10-CM

## 2015-01-13 NOTE — Patient Instructions (Signed)
1.  You must take Keppra 1000mg  twice daily and carbamazepine ER (tegretol) 400mg  twice daily as prescribed 2.  No driving 3.  You don't need to go to the ED every time you have a seizure, unless the shaking itself lasts greater than 5 minutes, you don't return to baseline within 30 minutes, or if you turn blue. 4.  Follow up in 5 months (after November 19)
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
## 2015-01-14 ENCOUNTER — Other Ambulatory Visit: Payer: Self-pay | Admitting: *Deleted

## 2015-01-14 DIAGNOSIS — R569 Unspecified convulsions: Secondary | ICD-10-CM

## 2015-01-22 ENCOUNTER — Encounter: Payer: Self-pay | Admitting: Neurology

## 2015-01-22 ENCOUNTER — Other Ambulatory Visit: Payer: Self-pay | Admitting: *Deleted

## 2015-01-22 MED ORDER — CARBAMAZEPINE ER 200 MG PO TB12: 400.0000 mg | ORAL_TABLET | Freq: Two times a day (BID) | ORAL | Status: DC

## 2015-01-22 NOTE — Telephone Encounter (Signed)
Patient requesting a refill on this medication

## 2015-01-26 ENCOUNTER — Telehealth: Payer: Self-pay | Admitting: Neurology

## 2015-01-26 ENCOUNTER — Other Ambulatory Visit: Payer: Self-pay | Admitting: *Deleted

## 2015-01-26 MED ORDER — CARBAMAZEPINE 200 MG PO TABS: ORAL_TABLET | ORAL | Status: DC

## 2015-01-26 NOTE — Telephone Encounter (Signed)
The correct amount was called into pharmacy  Tegretol XR 400 mg 1 PO BID #  60

## 2015-01-26 NOTE — Telephone Encounter (Signed)
Pt called in regards to his medication Tegretol XR/Dawn CB# [phone]

## 2015-02-04 ENCOUNTER — Ambulatory Visit: Payer: 59 | Admitting: Neurology

## 2015-02-18 ENCOUNTER — Ambulatory Visit: Payer: 59 | Admitting: Gastroenterology

## 2015-03-02 ENCOUNTER — Ambulatory Visit: Payer: 59 | Admitting: Family Medicine

## 2015-03-09 ENCOUNTER — Other Ambulatory Visit: Payer: 59

## 2015-04-04 ENCOUNTER — Other Ambulatory Visit: Payer: Self-pay | Admitting: Neurology

## 2015-04-06 ENCOUNTER — Telehealth: Payer: Self-pay | Admitting: Neurology

## 2015-04-06 NOTE — Telephone Encounter (Signed)
Patient is aware that his Tegretol 400 mg xr was called to pharmacy 04/05/15 it is ready for pick up

## 2015-04-06 NOTE — Telephone Encounter (Signed)
Pt needs a refill on tegretol 400mg  called into a [facility] on [address] pt phone number is [phone]

## 2015-05-01 ENCOUNTER — Other Ambulatory Visit: Payer: Self-pay

## 2015-05-01 ENCOUNTER — Other Ambulatory Visit: Payer: Self-pay | Admitting: Neurology

## 2015-05-01 ENCOUNTER — Telehealth: Payer: Self-pay | Admitting: Neurology

## 2015-05-01 DIAGNOSIS — R569 Unspecified convulsions: Secondary | ICD-10-CM

## 2015-05-01 MED ORDER — LEVETIRACETAM 1000 MG PO TABS: 1000.0000 mg | ORAL_TABLET | Freq: Two times a day (BID) | ORAL | Status: DC

## 2015-05-01 MED ORDER — CARBAMAZEPINE ER 400 MG PO TB12: 400.0000 mg | ORAL_TABLET | Freq: Two times a day (BID) | ORAL | Status: DC

## 2015-05-01 NOTE — Telephone Encounter (Signed)
Pt needs a refill on tegretol and keppra called into [facility] [address] pt phone number is [phone]

## 2015-05-01 NOTE — Telephone Encounter (Signed)
Sent in Tegretol and Keppra to pharmacy. LMTCB. [facility]

## 2015-05-04 NOTE — Telephone Encounter (Signed)
Spoke with patient. He received Melissa's message last week about his medications sent into his pharmacy

## 2015-05-13 IMAGING — CR DG CHEST 1V PORT
1 series · 1 of 1 positions shown · non-contrast
Comparison: Earlier today.

CLINICAL DATA: Hypoxia.  Tachypnea.

PORTABLE CHEST - 1 VIEW

[AP]
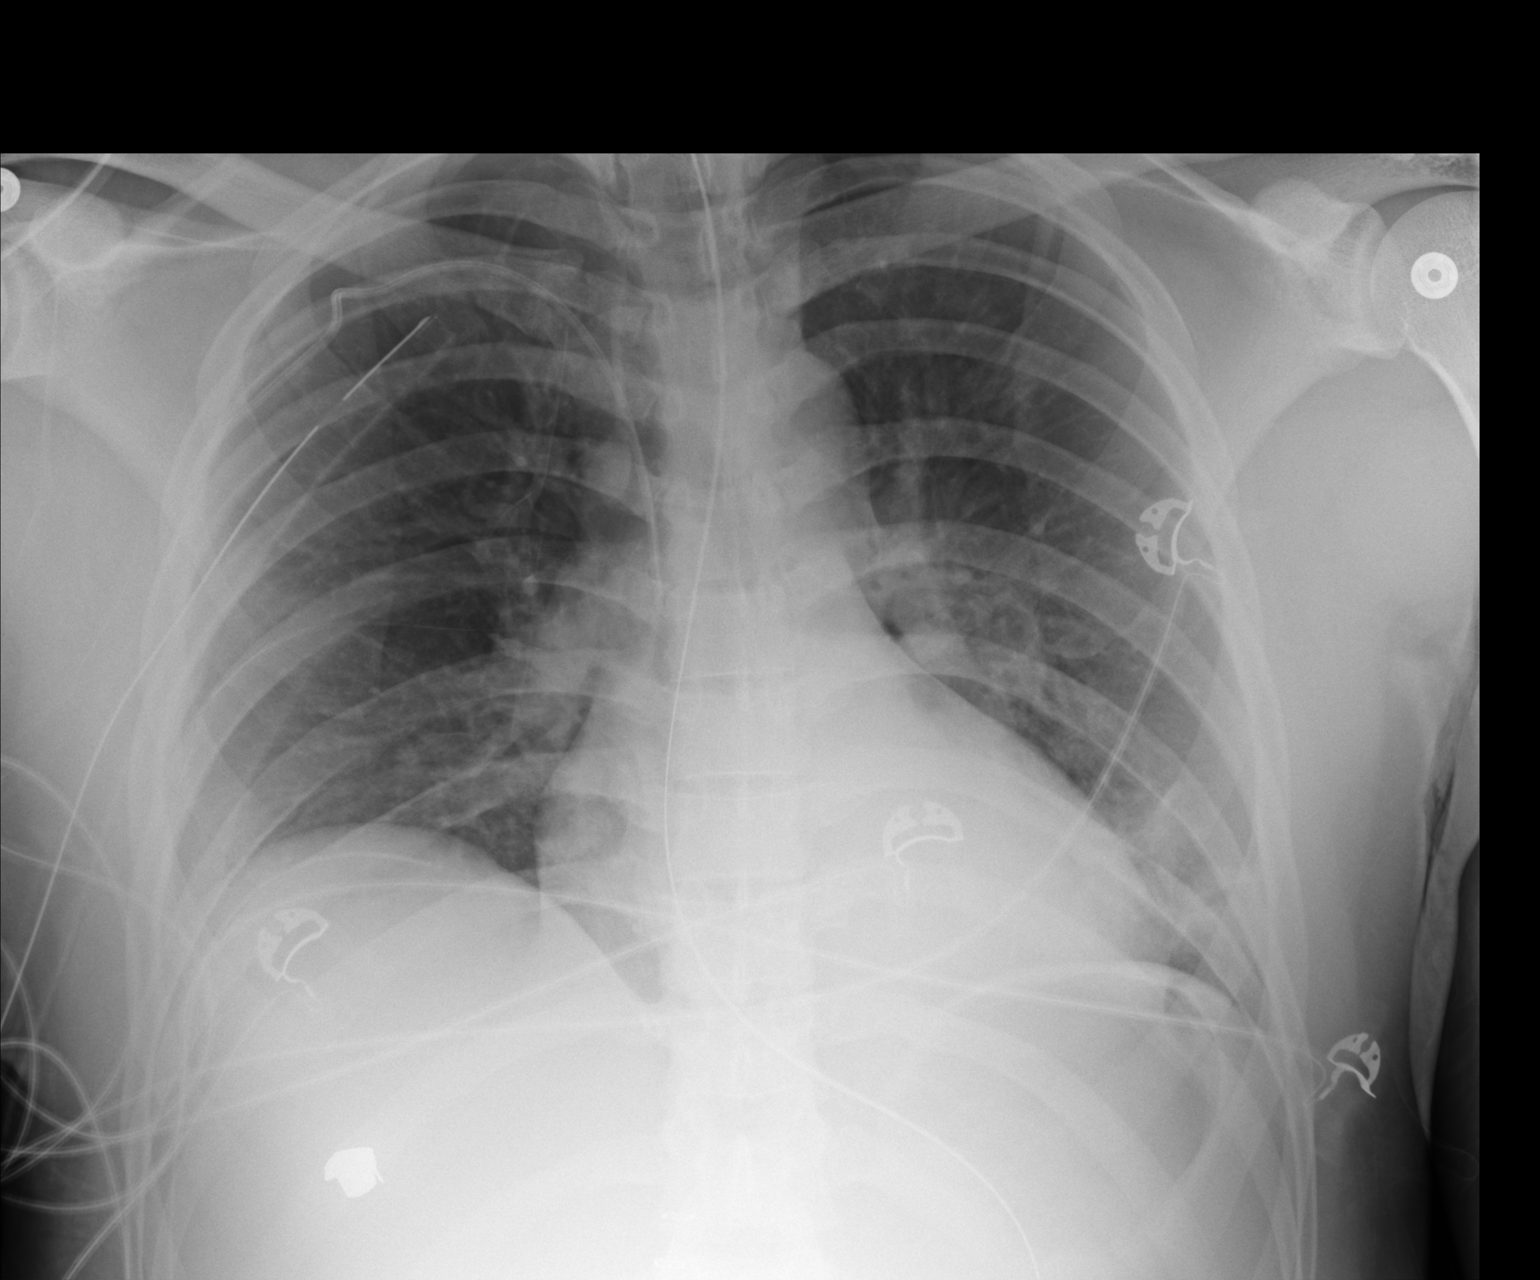

[1 of 1 positions shown; findings below may reference images not displayed]

FINDINGS: Nasogastric tube extending into the stomach.  Stable
right subclavian catheter.  Stable right chest tube.  Tiny right
apical pneumothorax, occupying less than 5% of the right
hemithorax.  Stable borderline enlarged cardiac silhouette.
Increased left basilar airspace opacity.  Stable minimal right
basilar airspace opacity and linear density.
IMPRESSION: 1.  Interval less than 5% right apical pneumothorax with a stable
right chest tube.
2.  Increased left basilar atelectasis and possible pneumonia.
3.  Stable mild right basilar atelectasis.

## 2015-05-14 IMAGING — CR DG CHEST 1V PORT
2 series · 2 of 2 positions shown · non-contrast
Comparison: Portable exam 4404 hours compared to 01/20/2013

CLINICAL DATA: Follow up pneumothorax

PORTABLE CHEST - 1 VIEW

[AP (1 of 2)]
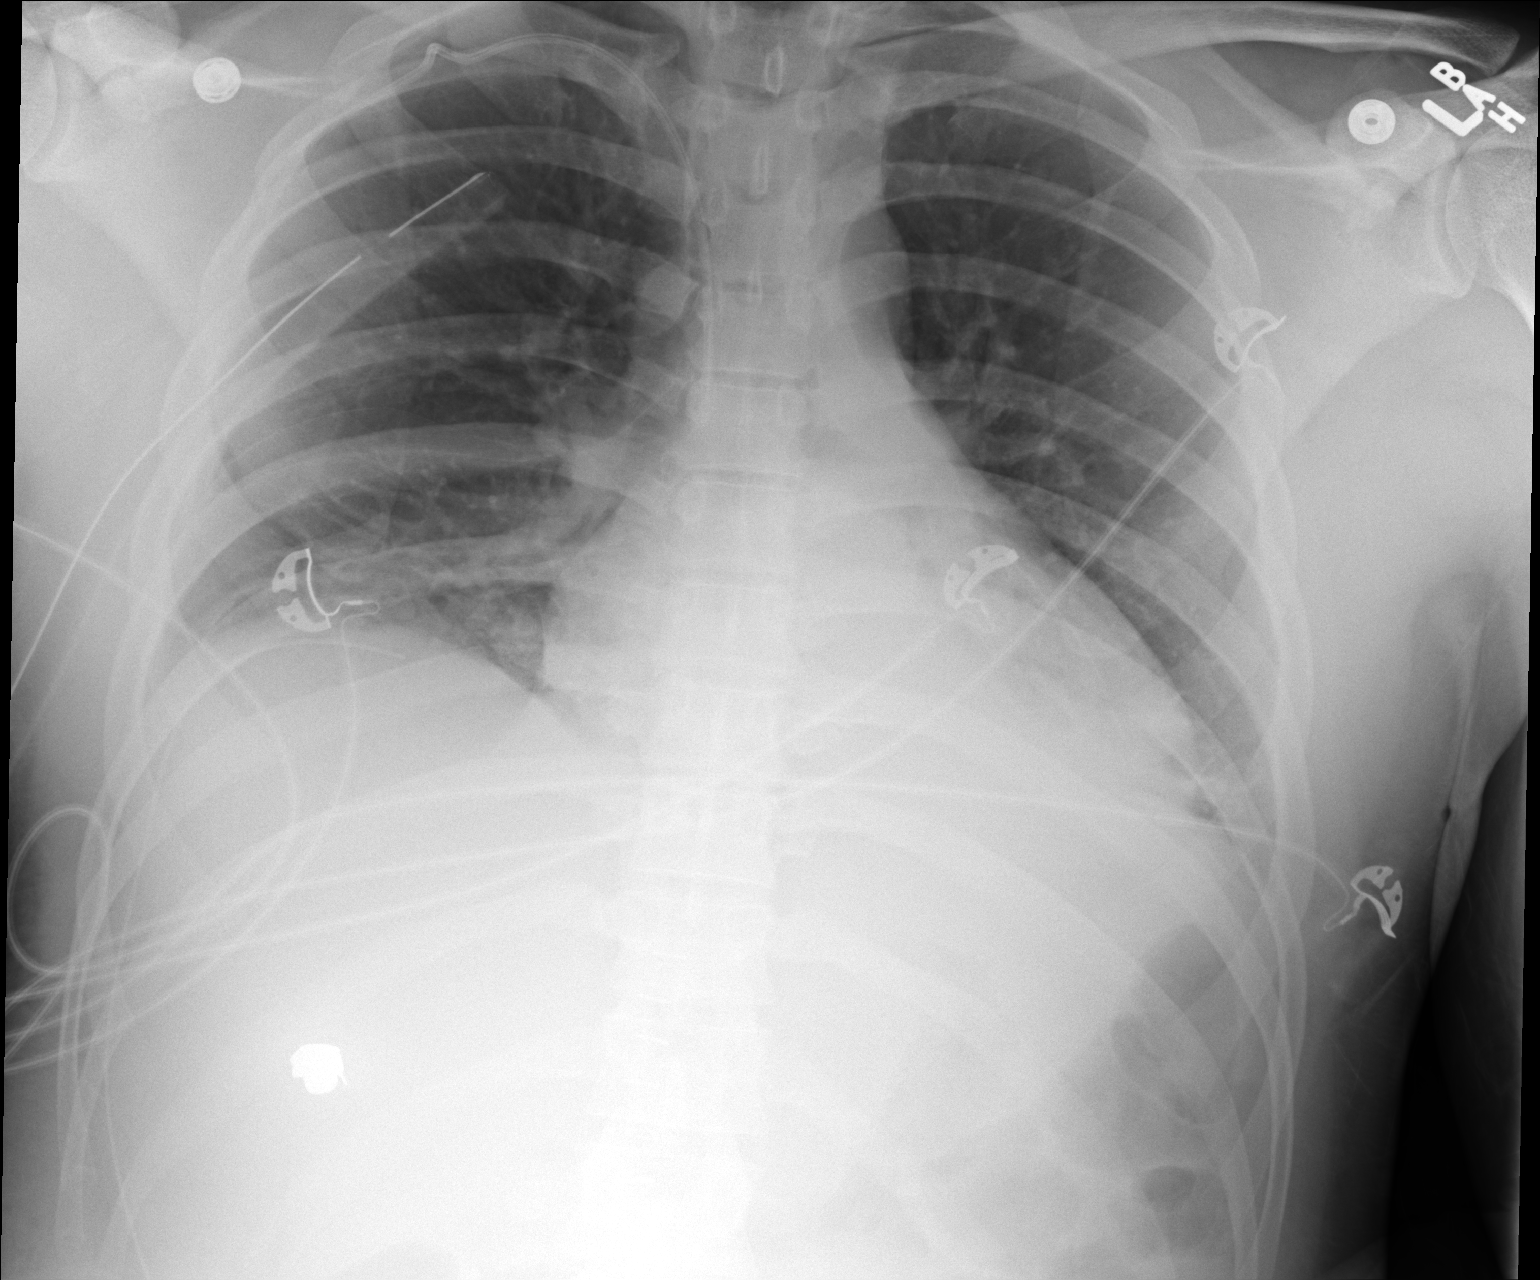

[AP (2 of 2)]
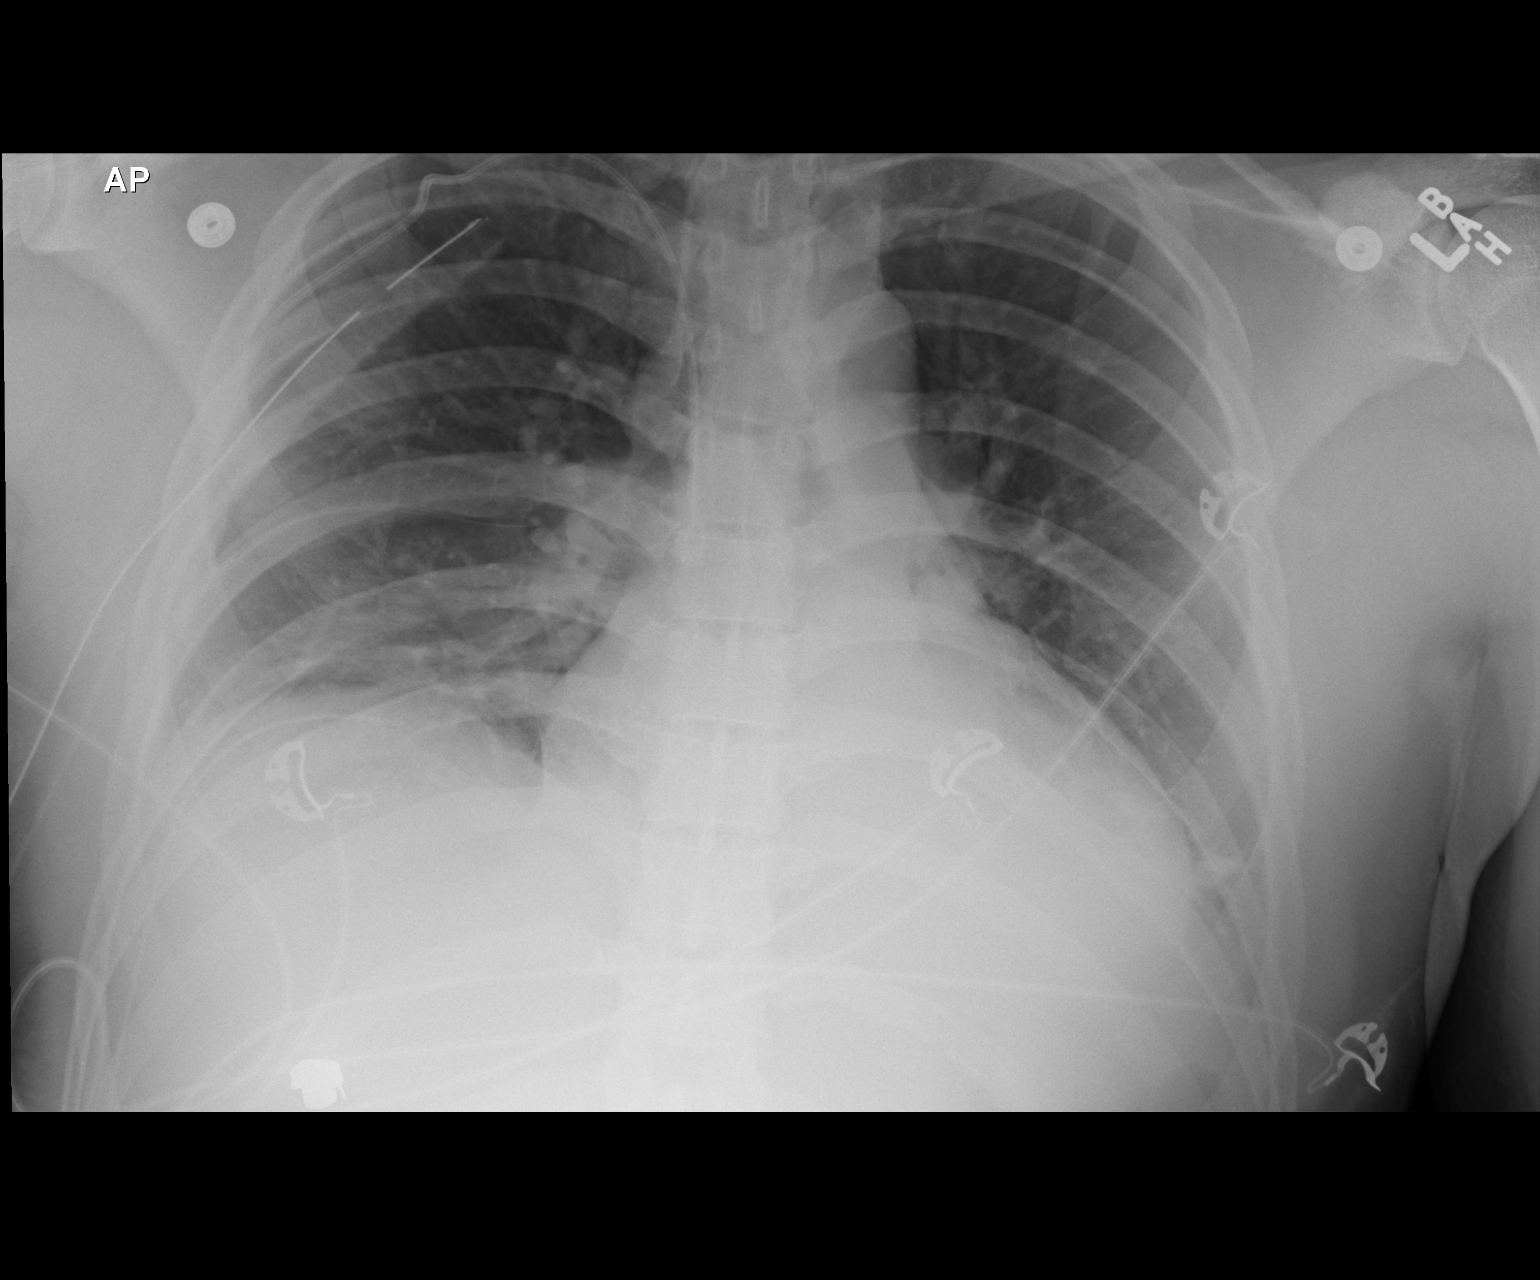

[2 of 2 positions shown; findings below may reference images not displayed]

FINDINGS: Right thoracostomy tube unchanged.
Right side central venous catheter tip projects over SVC.
Small right apical pneumothorax little changed.
Upper normal heart size.
Left lower lobe atelectasis versus consolidation persists.
Increased right basilar atelectasis.
No acute osseous findings.
IMPRESSION: Persistent small right apex pneumothorax.
Persistent left lower lobe atelectasis versus consolidation with
slightly increased right basilar atelectasis.

## 2015-05-15 IMAGING — CR DG CHEST 1V PORT
1 series · 1 of 1 positions shown · non-contrast
Comparison: Portable exam 1662 hours compared to 01/21/2013

CLINICAL DATA: Pneumothorax, follow-up

PORTABLE CHEST - 1 VIEW

[AP]
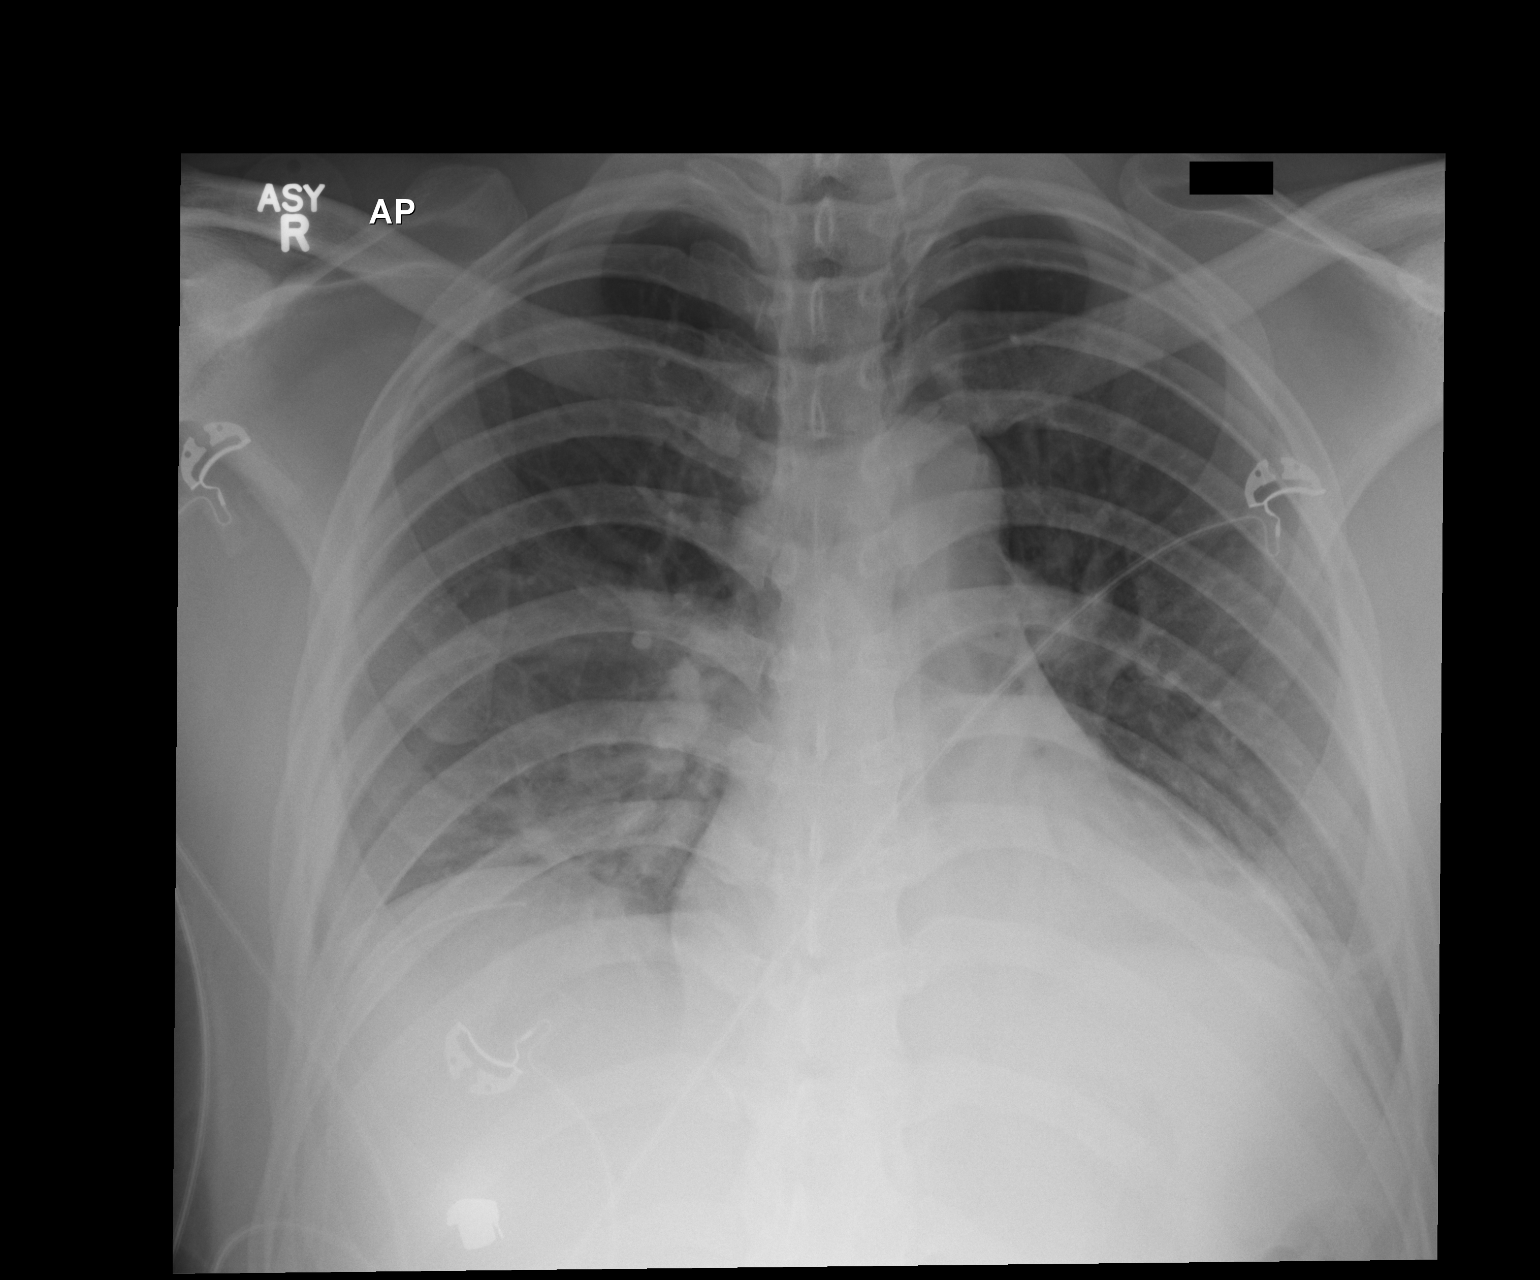

[1 of 1 positions shown; findings below may reference images not displayed]

FINDINGS: Superior right thoracostomy tube removed.
Basilar right thoracostomy tube persists.
Tiny residual right apex pneumothorax.
Upper normal-sized cardiac silhouette.
Persistent bibasilar opacities greater on left unchanged.
Upper lungs otherwise clear.
IMPRESSION: Persistent bibasilar opacities and tiny residual right apex
pneumothorax.

## 2015-05-19 IMAGING — CR DG CHEST 1V PORT
1 series · 1 of 1 positions shown · non-contrast
Comparison: 01/25/2013

CLINICAL DATA: Respiratory failure

PORTABLE CHEST - 1 VIEW

[AP]
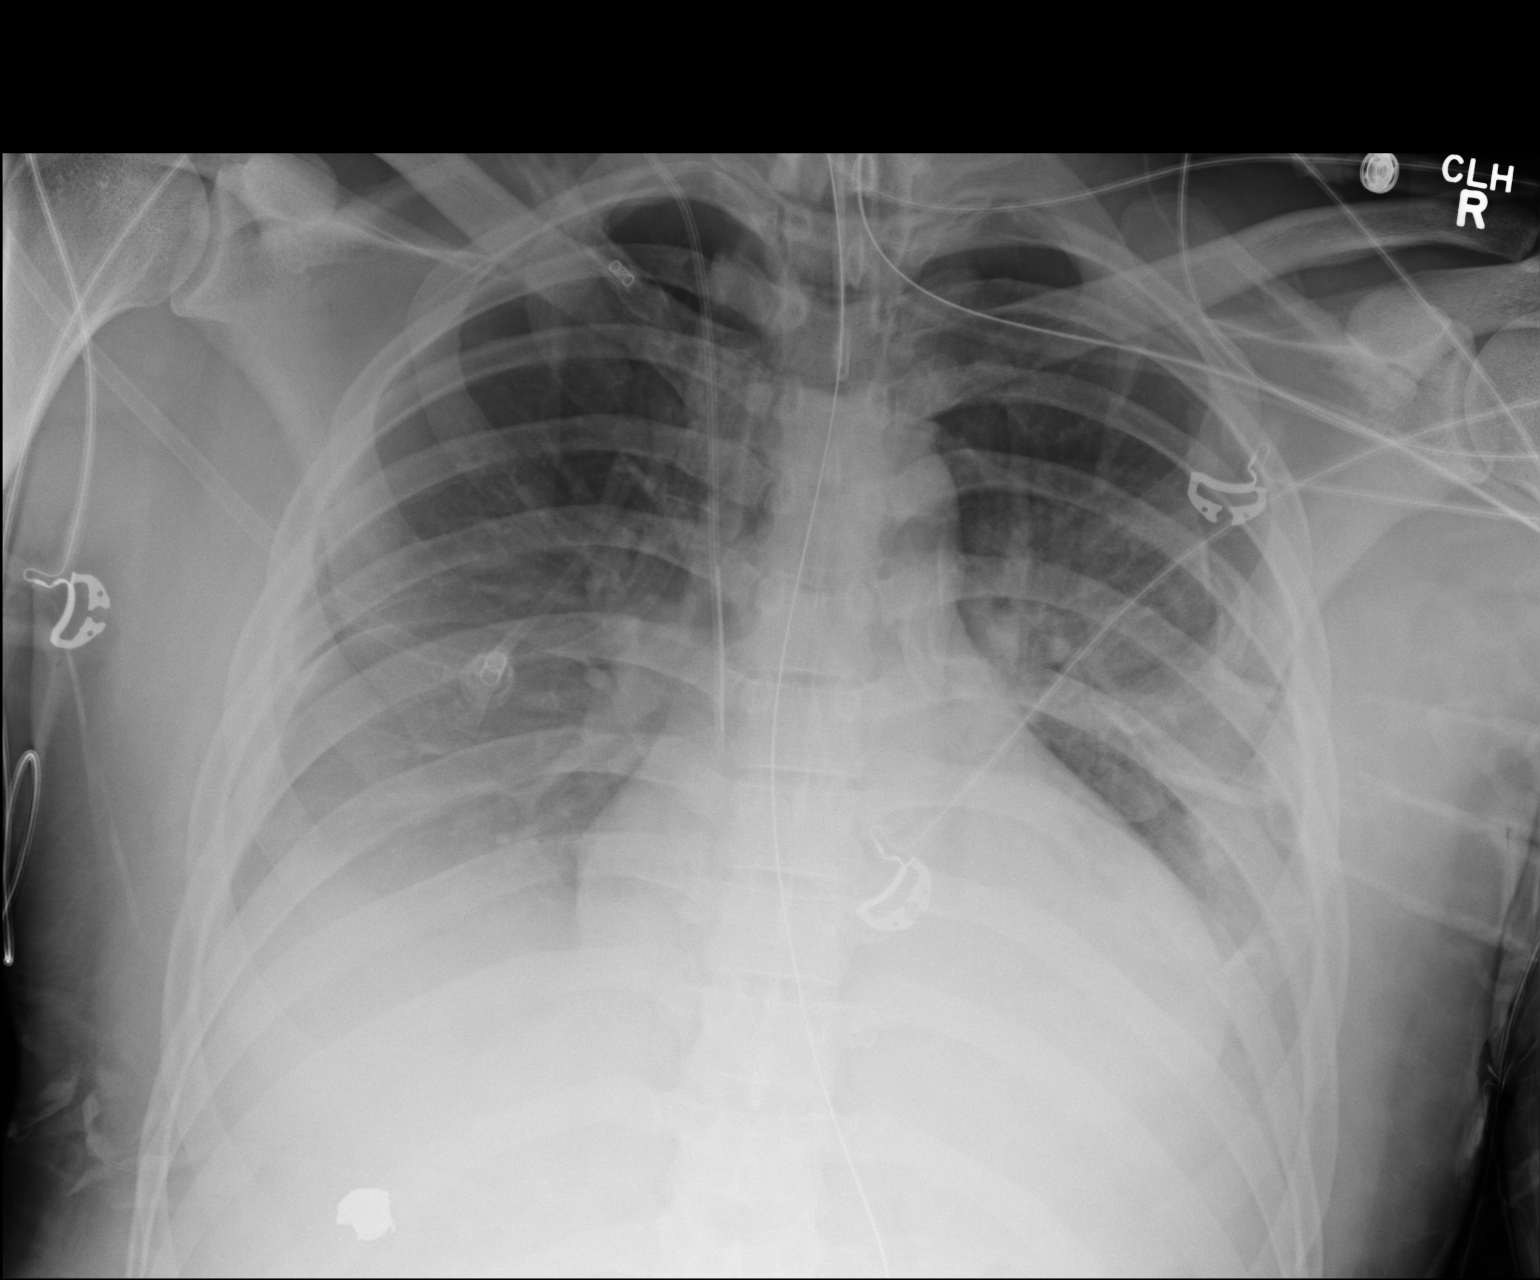

[1 of 1 positions shown; findings below may reference images not displayed]

FINDINGS: There is hazy perihilar airspace opacity that extends to
the lung bases where the hemidiaphragms are obscured.  This has the
appearance of pulmonary edema with associated pleural effusions.
It has increased from the prior study.

No pneumothorax.

Endotracheal tube tip now lies 5 cm above carina.

Right internal jugular central venous line and nasogastric tube are
stable well-positioned.
IMPRESSION: Worsened lung aeration with an increase in the size of the pleural
effusions and new perihilar and lower lung zone airspace opacity
that is likely pulmonary edema.

Support apparatus remains well positioned.
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
## 2015-06-24 ENCOUNTER — Encounter: Payer: Self-pay | Admitting: Neurology

## 2015-06-24 ENCOUNTER — Ambulatory Visit (INDEPENDENT_AMBULATORY_CARE_PROVIDER_SITE_OTHER): Payer: Federal, State, Local not specified - PPO | Admitting: Neurology

## 2015-06-24 VITALS — BP 114/70 | HR 92 | Ht 72.5 in | Wt 201.0 lb

## 2015-06-24 DIAGNOSIS — G40209 Localization-related (focal) (partial) symptomatic epilepsy and epileptic syndromes with complex partial seizures, not intractable, without status epilepticus: Secondary | ICD-10-CM

## 2015-06-24 NOTE — Patient Instructions (Signed)
Continue: Keppra 1000mg  twice daily Tegretol CR 400mg  twice daily  We will schedule 72 hour ambulatory EEG to capture the spells.  If it shows anything suspicious, we may need to adjust medications a little more Otherwise, follow up in 6 months.
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
## 2015-07-01 ENCOUNTER — Ambulatory Visit: Payer: 59 | Admitting: Neurology

## 2015-08-16 ENCOUNTER — Telehealth: Payer: Self-pay | Admitting: Neurology

## 2015-08-16 NOTE — Telephone Encounter (Signed)
Pt will need to resch his 72 hour eeg please call him at [phone]

## 2015-08-17 ENCOUNTER — Ambulatory Visit (INDEPENDENT_AMBULATORY_CARE_PROVIDER_SITE_OTHER): Payer: Federal, State, Local not specified - PPO | Admitting: Neurology

## 2015-08-17 DIAGNOSIS — R569 Unspecified convulsions: Secondary | ICD-10-CM | POA: Diagnosis not present

## 2015-08-20 ENCOUNTER — Encounter: Payer: Self-pay | Admitting: Neurology

## 2015-08-21 ENCOUNTER — Telehealth: Payer: Self-pay

## 2015-08-21 NOTE — Telephone Encounter (Signed)
Message relayed to patient. Verbalized understanding and denied questions.   
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
## 2015-08-21 NOTE — Telephone Encounter (Signed)
-----   Message from Pieter Partridge, DO sent at 08/21/2015 12:43 PM EST ----- EEG is normal

## 2015-10-06 ENCOUNTER — Telehealth: Payer: Self-pay | Admitting: *Deleted

## 2015-10-06 NOTE — Telephone Encounter (Signed)
Unable to reach patient at time of pre-visit call. Left message for patient to return call when available.  

## 2015-10-07 ENCOUNTER — Ambulatory Visit: Payer: Federal, State, Local not specified - PPO | Admitting: Physician Assistant

## 2015-10-07 ENCOUNTER — Telehealth: Payer: Self-pay | Admitting: Physician Assistant

## 2015-10-08 ENCOUNTER — Encounter: Payer: Self-pay | Admitting: Physician Assistant

## 2015-10-08 NOTE — Telephone Encounter (Signed)
Pt was no show 10/07/15 1:30pm for new pt appt, pt was originally to establish with Dr. Birdie Riddle 12/2014. Pt has been in for 1 acute and 1 follow up appt (both with Percell Miller), pt was no show for new pt and cpe with Dr. Birdie Riddle and now no show with you for new pt.   [place] or no charge?   Ebony - Please advise if we should reschedule pt with [facility] if he calls in?

## 2015-10-08 NOTE — Telephone Encounter (Signed)
Marked to charge, mailing no show letter, noted chart not to reschedule with [facility]. Ebony - please advise for practice

## 2015-10-08 NOTE — Telephone Encounter (Signed)
Charge. Do not reschedule with me. Will leave up to management whether or not he should be rescheduled with practice.

## 2015-10-09 NOTE — Telephone Encounter (Signed)
Per Charlena Cross pt had 3 no shows and never established as pt - do NOT reschedule at [facility]

## 2015-11-14 ENCOUNTER — Emergency Department (HOSPITAL_COMMUNITY)
Admission: EM | Admit: 2015-11-14 | Discharge: 2015-11-14 | Disposition: A | Discharge status: Home / Self Care | Payer: Federal, State, Local not specified - PPO | Attending: Emergency Medicine | Admitting: Emergency Medicine

## 2015-11-14 ENCOUNTER — Encounter (HOSPITAL_COMMUNITY): Payer: Self-pay | Admitting: Nurse Practitioner

## 2015-11-14 DIAGNOSIS — F329 Major depressive disorder, single episode, unspecified: Secondary | ICD-10-CM | POA: Diagnosis not present

## 2015-11-14 DIAGNOSIS — D171 Benign lipomatous neoplasm of skin and subcutaneous tissue of trunk: Secondary | ICD-10-CM

## 2015-11-14 DIAGNOSIS — Z79899 Other long term (current) drug therapy: Secondary | ICD-10-CM | POA: Insufficient documentation

## 2015-11-14 DIAGNOSIS — Z87891 Personal history of nicotine dependence: Secondary | ICD-10-CM | POA: Insufficient documentation

## 2015-11-14 DIAGNOSIS — Z87828 Personal history of other (healed) physical injury and trauma: Secondary | ICD-10-CM | POA: Diagnosis not present

## 2015-11-14 DIAGNOSIS — Z8619 Personal history of other infectious and parasitic diseases: Secondary | ICD-10-CM | POA: Insufficient documentation

## 2015-11-14 DIAGNOSIS — Z8679 Personal history of other diseases of the circulatory system: Secondary | ICD-10-CM | POA: Diagnosis not present

## 2015-11-14 DIAGNOSIS — I10 Essential (primary) hypertension: Secondary | ICD-10-CM | POA: Diagnosis not present

## 2015-11-14 DIAGNOSIS — L989 Disorder of the skin and subcutaneous tissue, unspecified: Secondary | ICD-10-CM | POA: Diagnosis present

## 2015-11-14 MED ORDER — NAPROXEN 500 MG PO TABS: 500.0000 mg | ORAL_TABLET | Freq: Two times a day (BID) | ORAL | Status: DC

## 2015-11-14 MED ORDER — CEPHALEXIN 500 MG PO CAPS: 500.0000 mg | ORAL_CAPSULE | Freq: Four times a day (QID) | ORAL | Status: DC

## 2015-11-14 NOTE — Discharge Instructions (Signed)
Lipoma A lipoma is a noncancerous (benign) tumor that is made up of fat cells. This is a very common type of soft-tissue growth. Lipomas are usually found under the skin (subcutaneous). They may occur in any tissue of the body that contains fat. Common areas for lipomas to appear include the back, shoulders, buttocks, and thighs. Lipomas grow slowly, and they are usually painless. Most lipomas do not cause problems and do not require treatment. CAUSES The cause of this condition is not known. RISK FACTORS This condition is more likely to develop in:  People who are 37-65 years old.  People who have a family history of lipomas. SYMPTOMS A lipoma usually appears as a small, round bump under the skin. It may feel soft or rubbery, but the firmness can vary. Most lipomas are not painful. However, a lipoma may become painful if it is located in an area where it pushes on nerves. DIAGNOSIS A lipoma can usually be diagnosed with a physical exam. You may also have tests to confirm the diagnosis and to rule out other conditions. Tests may include:  Imaging tests, such as a CT scan or MRI.  Removal of a tissue sample to be looked at under a microscope (biopsy). TREATMENT Treatment is not needed for small lipomas that are not causing problems. If a lipoma continues to get bigger or it causes problems, removal is often the best option. Lipomas can also be removed to improve appearance. Removal of a lipoma is usually done with a surgery in which the fatty cells and the surrounding capsule are removed. Most often, a medicine that numbs the area (local anesthetic) is used for this procedure. HOME CARE INSTRUCTIONS  Keep all follow-up visits as directed by your health care provider. This is important. SEEK MEDICAL CARE IF:  Your lipoma becomes larger or hard.  Your lipoma becomes painful, red, or increasingly swollen. These could be signs of infection or a more serious condition.   This information is  not intended to replace advice given to you by your health care provider. Make sure you discuss any questions you have with your health care provider.   Call [facility] and schedule a follow up appointment for consultation and possible removal of your lipoma. Take antibiotics as prescribed. Return to the ED if you experience redness or swelling around your lipoma, increased pain, fever.
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
## 2015-12-02 ENCOUNTER — Ambulatory Visit: Payer: Self-pay | Admitting: General Surgery

## 2015-12-02 DIAGNOSIS — S20459A Superficial foreign body of unspecified back wall of thorax, initial encounter: Secondary | ICD-10-CM | POA: Diagnosis not present

## 2015-12-21 ENCOUNTER — Telehealth: Payer: Self-pay

## 2015-12-21 DIAGNOSIS — G40209 Localization-related (focal) (partial) symptomatic epilepsy and epileptic syndromes with complex partial seizures, not intractable, without status epilepticus: Secondary | ICD-10-CM

## 2015-12-21 NOTE — Telephone Encounter (Signed)
-----   Message from Pieter Partridge, DO sent at 12/21/2015  7:45 AM EDT ----- I am seeing Mr. Nawrot on Wednesday.  If possible, I would like to get CBC and CMP prior to follow up.

## 2015-12-22 NOTE — Telephone Encounter (Signed)
Pt's listed numbers are for a Education officer, museum. Left her a message on Monday with no response.

## 2015-12-23 ENCOUNTER — Ambulatory Visit: Payer: Federal, State, Local not specified - PPO | Admitting: Neurology

## 2015-12-25 ENCOUNTER — Other Ambulatory Visit: Payer: Self-pay | Admitting: Neurology

## 2015-12-31 ENCOUNTER — Inpatient Hospital Stay (HOSPITAL_COMMUNITY)
Admission: RE | Admit: 2015-12-31 | Discharge: 2015-12-31 | Disposition: A | Discharge status: Home / Self Care | Payer: Federal, State, Local not specified - PPO | Source: Ambulatory Visit

## 2015-12-31 NOTE — Pre-Procedure Instructions (Signed)
    [facility]  12/31/2015      [facility] # [place], [place] [address] Phone: [phone] Fax: [phone]  [facility] - [place], [place] [address] Phone: [phone] Fax: [phone]  [facility] [place], [place] [place] [address] Phone: [phone] Fax: [phone]    Your procedure is scheduled on January 07, 2016.  Report to [facility] Admitting at 9 A.M.  Call this number if you have problems the morning of surgery:  [phone]   Remember:  Do not eat food or drink liquids after midnight.  Take these medicines the morning of surgery with A SIP OF WATER : levETIRAcetam (KEPPRA), carbamazepine (TEGRETOL XR)   Do not wear jewelry.  Do not wear lotions, powders, or colognes.  You may wear deodorant.  Men may shave face and neck.  Do not bring valuables to the hospital.  [facility] is not responsible for any belongings or valuables.  Contacts, dentures or bridgework may not be worn into surgery.  Leave your suitcase in the car.  After surgery it may be brought to your room.  For patients admitted to the hospital, discharge time will be determined by your treatment team.  Patients discharged the day of surgery will not be allowed to drive home.   Name and phone number of your driver:    Special instructions:  "Preparing for Surgery"  Please read over the following fact sheets that you were given. Pain Booklet, Coughing and Deep Breathing and Surgical Site Infection Prevention

## 2016-01-06 ENCOUNTER — Encounter (HOSPITAL_COMMUNITY): Payer: Self-pay | Admitting: *Deleted

## 2016-01-06 MED ORDER — CEFAZOLIN SODIUM-DEXTROSE 2-4 GM/100ML-% IV SOLN
2.0000 g | INTRAVENOUS | Status: AC
  Administered 2016-01-07: 2 g via INTRAVENOUS
  Filled 2016-01-06: qty 100

## 2016-01-06 NOTE — Progress Notes (Signed)
Pt denies cardiac history, chest pain or sob. 
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
## 2016-01-07 ENCOUNTER — Encounter (HOSPITAL_COMMUNITY): Payer: Self-pay | Admitting: *Deleted

## 2016-01-07 ENCOUNTER — Ambulatory Visit (HOSPITAL_COMMUNITY): Payer: Federal, State, Local not specified - PPO | Admitting: Certified Registered"

## 2016-01-07 ENCOUNTER — Ambulatory Visit (HOSPITAL_COMMUNITY)
Admission: RE | Admit: 2016-01-07 | Discharge: 2016-01-07 | Disposition: A | Discharge status: Home / Self Care | Payer: Federal, State, Local not specified - PPO | Source: Ambulatory Visit | Attending: General Surgery | Admitting: General Surgery

## 2016-01-07 ENCOUNTER — Encounter (HOSPITAL_COMMUNITY)
Admission: RE | Disposition: A | Discharge status: Home / Self Care | Payer: Self-pay | Source: Ambulatory Visit | Attending: General Surgery

## 2016-01-07 DIAGNOSIS — S20451A Superficial foreign body of right back wall of thorax, initial encounter: Secondary | ICD-10-CM | POA: Diagnosis not present

## 2016-01-07 DIAGNOSIS — G40909 Epilepsy, unspecified, not intractable, without status epilepticus: Secondary | ICD-10-CM | POA: Diagnosis not present

## 2016-01-07 DIAGNOSIS — F419 Anxiety disorder, unspecified: Secondary | ICD-10-CM | POA: Insufficient documentation

## 2016-01-07 DIAGNOSIS — S31040D Puncture wound with foreign body of lower back and pelvis without penetration into retroperitoneum, subsequent encounter: Secondary | ICD-10-CM | POA: Insufficient documentation

## 2016-01-07 DIAGNOSIS — I10 Essential (primary) hypertension: Secondary | ICD-10-CM | POA: Diagnosis not present

## 2016-01-07 DIAGNOSIS — Z87891 Personal history of nicotine dependence: Secondary | ICD-10-CM | POA: Insufficient documentation

## 2016-01-07 DIAGNOSIS — M795 Residual foreign body in soft tissue: Secondary | ICD-10-CM | POA: Diagnosis not present

## 2016-01-07 DIAGNOSIS — S30850A Superficial foreign body of lower back and pelvis, initial encounter: Secondary | ICD-10-CM | POA: Diagnosis not present

## 2016-01-07 HISTORY — DX: Other specified postprocedural states: Z98.890

## 2016-01-07 HISTORY — PX: FOREIGN BODY REMOVAL: SHX962

## 2016-01-07 LAB — CBC
HCT: 49.3 % (ref 39.0–52.0)
Hemoglobin: 16.1 g/dL (ref 13.0–17.0)
MCH: 28.3 pg (ref 26.0–34.0)
MCHC: 32.7 g/dL (ref 30.0–36.0)
MCV: 86.8 fL (ref 78.0–100.0)
PLATELETS: 191 10*3/uL (ref 150–400)
RBC: 5.68 MIL/uL (ref 4.22–5.81)
RDW: 12.8 % (ref 11.5–15.5)
WBC: 5.9 10*3/uL (ref 4.0–10.5)

## 2016-01-07 LAB — BASIC METABOLIC PANEL
Anion gap: 9 (ref 5–15)
BUN: 9 mg/dL (ref 6–20)
CHLORIDE: 104 mmol/L (ref 101–111)
CO2: 26 mmol/L (ref 22–32)
CREATININE: 1.17 mg/dL (ref 0.61–1.24)
Calcium: 9.6 mg/dL (ref 8.9–10.3)
Glucose, Bld: 92 mg/dL (ref 65–99)
Potassium: 4.3 mmol/L (ref 3.5–5.1)
SODIUM: 139 mmol/L (ref 135–145)

## 2016-01-07 SURGERY — FOREIGN BODY REMOVAL ADULT
Anesthesia: General | Site: Back | Laterality: Right

## 2016-01-07 MED ORDER — MIDAZOLAM HCL 2 MG/2ML IJ SOLN
INTRAMUSCULAR | Status: AC
  Filled 2016-01-07: qty 2

## 2016-01-07 MED ORDER — OXYCODONE HCL 5 MG PO TABS: 5.0000 mg | ORAL_TABLET | Freq: Four times a day (QID) | ORAL | Status: DC | PRN

## 2016-01-07 MED ORDER — SUGAMMADEX SODIUM 200 MG/2ML IV SOLN
INTRAVENOUS | Status: DC | PRN
  Administered 2016-01-07: 200 mg via INTRAVENOUS

## 2016-01-07 MED ORDER — LIDOCAINE 2% (20 MG/ML) 5 ML SYRINGE
INTRAMUSCULAR | Status: AC
  Filled 2016-01-07: qty 5

## 2016-01-07 MED ORDER — PROPOFOL 10 MG/ML IV BOLUS
INTRAVENOUS | Status: AC
  Filled 2016-01-07: qty 20

## 2016-01-07 MED ORDER — LACTATED RINGERS IV SOLN: INTRAVENOUS | Status: DC

## 2016-01-07 MED ORDER — FENTANYL CITRATE (PF) 250 MCG/5ML IJ SOLN
INTRAMUSCULAR | Status: AC
  Filled 2016-01-07: qty 5

## 2016-01-07 MED ORDER — CHLORHEXIDINE GLUCONATE 4 % EX LIQD: 1.0000 "application " | Freq: Once | CUTANEOUS | Status: DC

## 2016-01-07 MED ORDER — FENTANYL CITRATE (PF) 100 MCG/2ML IJ SOLN: 25.0000 ug | INTRAMUSCULAR | Status: DC | PRN

## 2016-01-07 MED ORDER — LIDOCAINE HCL (CARDIAC) 20 MG/ML IV SOLN
INTRAVENOUS | Status: DC | PRN
  Administered 2016-01-07: 80 mg via INTRAVENOUS

## 2016-01-07 MED ORDER — BUPIVACAINE-EPINEPHRINE 0.5% -1:200000 IJ SOLN
INTRAMUSCULAR | Status: DC | PRN
  Administered 2016-01-07: 10 mL

## 2016-01-07 MED ORDER — LACTATED RINGERS IV SOLN
INTRAVENOUS | Status: DC
  Administered 2016-01-07: 09:00:00 via INTRAVENOUS

## 2016-01-07 MED ORDER — ONDANSETRON HCL 4 MG/2ML IJ SOLN
INTRAMUSCULAR | Status: DC | PRN
  Administered 2016-01-07: 4 mg via INTRAVENOUS

## 2016-01-07 MED ORDER — 0.9 % SODIUM CHLORIDE (POUR BTL) OPTIME
TOPICAL | Status: DC | PRN
  Administered 2016-01-07: 1000 mL

## 2016-01-07 MED ORDER — MIDAZOLAM HCL 5 MG/5ML IJ SOLN
INTRAMUSCULAR | Status: DC | PRN
  Administered 2016-01-07: 2 mg via INTRAVENOUS

## 2016-01-07 MED ORDER — BUPIVACAINE-EPINEPHRINE (PF) 0.5% -1:200000 IJ SOLN
INTRAMUSCULAR | Status: AC
  Filled 2016-01-07: qty 30

## 2016-01-07 MED ORDER — ROCURONIUM BROMIDE 100 MG/10ML IV SOLN
INTRAVENOUS | Status: DC | PRN
  Administered 2016-01-07: 40 mg via INTRAVENOUS

## 2016-01-07 MED ORDER — PROPOFOL 10 MG/ML IV BOLUS
INTRAVENOUS | Status: DC | PRN
  Administered 2016-01-07: 200 mg via INTRAVENOUS

## 2016-01-07 MED ORDER — FENTANYL CITRATE (PF) 100 MCG/2ML IJ SOLN
INTRAMUSCULAR | Status: DC | PRN
  Administered 2016-01-07: 50 ug via INTRAVENOUS
  Administered 2016-01-07: 100 ug via INTRAVENOUS

## 2016-01-07 SURGICAL SUPPLY — 38 items
CANISTER SUCTION 2500CC (MISCELLANEOUS) IMPLANT
CHLORAPREP W/TINT 26ML (MISCELLANEOUS) ×2 IMPLANT
COVER SURGICAL LIGHT HANDLE (MISCELLANEOUS) ×2 IMPLANT
DECANTER SPIKE VIAL GLASS SM (MISCELLANEOUS) ×2 IMPLANT
DRAPE LAPAROTOMY T 102X78X121 (DRAPES) ×2 IMPLANT
DRAPE UTILITY XL STRL (DRAPES) ×4 IMPLANT
ELECT CAUTERY BLADE 6.4 (BLADE) ×2 IMPLANT
ELECT REM PT RETURN 9FT ADLT (ELECTROSURGICAL) ×2
ELECTRODE REM PT RTRN 9FT ADLT (ELECTROSURGICAL) ×1 IMPLANT
GAUZE SPONGE 4X4 12PLY STRL (GAUZE/BANDAGES/DRESSINGS) IMPLANT
GAUZE SPONGE 4X4 16PLY XRAY LF (GAUZE/BANDAGES/DRESSINGS) ×2 IMPLANT
GLOVE BIO SURGEON STRL SZ7 (GLOVE) ×2 IMPLANT
GLOVE BIO SURGEON STRL SZ8 (GLOVE) ×2 IMPLANT
GLOVE BIOGEL PI IND STRL 8 (GLOVE) ×1 IMPLANT
GLOVE BIOGEL PI INDICATOR 8 (GLOVE) ×1
GOWN STRL REUS W/ TWL LRG LVL3 (GOWN DISPOSABLE) ×1 IMPLANT
GOWN STRL REUS W/ TWL XL LVL3 (GOWN DISPOSABLE) ×1 IMPLANT
GOWN STRL REUS W/TWL LRG LVL3 (GOWN DISPOSABLE) ×1
GOWN STRL REUS W/TWL XL LVL3 (GOWN DISPOSABLE) ×1
KIT BASIN OR (CUSTOM PROCEDURE TRAY) ×2 IMPLANT
KIT ROOM TURNOVER OR (KITS) ×2 IMPLANT
LIQUID BAND (GAUZE/BANDAGES/DRESSINGS) ×2 IMPLANT
NEEDLE 22X1 1/2 (OR ONLY) (NEEDLE) ×2 IMPLANT
NS IRRIG 1000ML POUR BTL (IV SOLUTION) ×2 IMPLANT
PACK SURGICAL SETUP 50X90 (CUSTOM PROCEDURE TRAY) ×2 IMPLANT
PAD ARMBOARD 7.5X6 YLW CONV (MISCELLANEOUS) ×2 IMPLANT
PENCIL BUTTON HOLSTER BLD 10FT (ELECTRODE) ×2 IMPLANT
SPECIMEN JAR SMALL (MISCELLANEOUS) ×2 IMPLANT
STRIP CLOSURE SKIN 1/2X4 (GAUZE/BANDAGES/DRESSINGS) IMPLANT
SUT MNCRL AB 4-0 PS2 18 (SUTURE) ×2 IMPLANT
SUT VIC AB 3-0 SH 27 (SUTURE) ×1
SUT VIC AB 3-0 SH 27X BRD (SUTURE) ×1 IMPLANT
SYR BULB 3OZ (MISCELLANEOUS) ×2 IMPLANT
SYR CONTROL 10ML LL (SYRINGE) ×2 IMPLANT
TOWEL OR 17X24 6PK STRL BLUE (TOWEL DISPOSABLE) ×2 IMPLANT
TOWEL OR 17X26 10 PK STRL BLUE (TOWEL DISPOSABLE) ×2 IMPLANT
TUBE CONNECTING 12X1/4 (SUCTIONS) IMPLANT
YANKAUER SUCT BULB TIP NO VENT (SUCTIONS) IMPLANT

## 2016-01-07 NOTE — Transfer of Care (Signed)
Immediate Anesthesia Transfer of Care Note  Patient: Dakota Evans  Procedure(s) Performed: Procedure(s): FOREIGN BODY REMOVAL RIGHT BACK  ADULT (Right)  Patient Location: PACU  Anesthesia Type:General  Level of Consciousness: awake and patient cooperative  Airway & Oxygen Therapy: Patient Spontanous Breathing and Patient connected to nasal cannula oxygen  Post-op Assessment: Report given to RN  Post vital signs: stable  Last Vitals:  Filed Vitals:   01/07/16 0855  BP: 136/62  Pulse: 74  Temp: 36.9 C  Resp: 18    Last Pain: There were no vitals filed for this visit.    Patients Stated Pain Goal: 3 ([id number])  Complications: No apparent anesthesia complications

## 2016-01-07 NOTE — Anesthesia Procedure Notes (Signed)
Procedure Name: Intubation Date/Time: 01/07/2016 10:03 AM Performed by: Oletta Lamas Pre-anesthesia Checklist: Patient identified, Emergency Drugs available, Suction available and Patient being monitored Patient Re-evaluated:Patient Re-evaluated prior to inductionOxygen Delivery Method: Circle System Utilized Preoxygenation: Pre-oxygenation with 100% oxygen Intubation Type: IV induction Ventilation: Mask ventilation without difficulty Laryngoscope Size: Mac and 3 Tube type: Oral Number of attempts: 1 Airway Equipment and Method: Stylet Placement Confirmation: ETT inserted through vocal cords under direct vision,  positive ETCO2 and breath sounds checked- equal and bilateral Secured at: 22 cm Tube secured with: Tape Dental Injury: Teeth and Oropharynx as per pre-operative assessment and Injury to lip
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
## 2016-01-07 NOTE — Interval H&P Note (Signed)
History and Physical Interval Note:  01/07/2016 9:16 AM  [employer]  has presented today for surgery, with the diagnosis of Foreign body right back   The various methods of treatment have been discussed with the patient and family. After consideration of risks, benefits and other options for treatment, the patient has consented to  Procedure(s): FOREIGN BODY REMOVAL RIGHT BACK  ADULT (Right) as a surgical intervention .  The patient's history has been reviewed, patient examined, site marked, no change in status, stable for surgery.  I have reviewed the patient's chart and labs.  Questions were answered to the patient's satisfaction.     Dakota Evans

## 2016-01-08 ENCOUNTER — Encounter (HOSPITAL_COMMUNITY): Payer: Self-pay | Admitting: General Surgery

## 2016-01-08 NOTE — Anesthesia Postprocedure Evaluation (Signed)
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  Procedure(s) Performed: Procedure(s) (LRB): FOREIGN BODY REMOVAL RIGHT BACK  ADULT (Right)  Patient location during evaluation: PACU Anesthesia Type: General Level of consciousness: awake and alert Pain management: pain level controlled Vital Signs Assessment: post-procedure vital signs reviewed and stable Respiratory status: spontaneous breathing, nonlabored ventilation, respiratory function stable and patient connected to nasal cannula oxygen Cardiovascular status: blood pressure returned to baseline and stable Postop Assessment: no signs of nausea or vomiting Anesthetic complications: no    Last Vitals:  Filed Vitals:   01/07/16 1152 01/07/16 1156  BP:  121/77  Pulse:  62  Temp: 36.5 C   Resp:      Last Pain: There were no vitals filed for this visit.               Kaisyn Reinhold L

## 2016-02-12 ENCOUNTER — Ambulatory Visit (INDEPENDENT_AMBULATORY_CARE_PROVIDER_SITE_OTHER): Payer: Federal, State, Local not specified - PPO | Admitting: Neurology

## 2016-02-12 ENCOUNTER — Encounter: Payer: Self-pay | Admitting: Neurology

## 2016-02-12 VITALS — BP 114/76 | HR 77 | Ht 72.5 in | Wt 201.0 lb

## 2016-02-12 DIAGNOSIS — R569 Unspecified convulsions: Secondary | ICD-10-CM | POA: Diagnosis not present

## 2016-02-12 MED ORDER — CARBAMAZEPINE ER 400 MG PO TB12: 400.0000 mg | ORAL_TABLET | Freq: Two times a day (BID) | ORAL | Status: DC

## 2016-02-12 MED ORDER — LEVETIRACETAM 750 MG PO TABS: 1500.0000 mg | ORAL_TABLET | Freq: Two times a day (BID) | ORAL | Status: DC

## 2016-02-12 NOTE — Patient Instructions (Signed)
1.  We will increase levetiracetam to 1500mg  twice daily.  I will prescribe 750mg  tablets, so take 2 tablets twice daily. 2.  Continue Tegretol 400mg  twice daily 3.  We will check CBC with diff, CMP, Keppra level and Tegretol level. 4.  [place] law, no driving until seizure-free for 6 months. 5.  Follow up in 6 months.  1. If medication has been prescribed for you to prevent seizures, take it exactly as directed.  Do not stop taking the medicine without talking to your doctor first, even if you have not had a seizure in a long time.   2. Avoid activities in which a seizure would cause danger to yourself or to others.  Don't operate dangerous machinery, swim alone, or climb in high or dangerous places, such as on ladders, roofs, or girders.  Do not drive unless your doctor says you may.  3. If you have any warning that you may have a seizure, lay down in a safe place where you can't hurt yourself.    4.  No driving for 6 months from last seizure, as per [facility].   Please refer to the following link on the [place] website for more information: http://www.epilepsyfoundation.org/[email or web address]/driving/drivingu.cfm   5.  Maintain good sleep hygiene.  6.  Notify your neurology if you are planning pregnancy or if you become pregnant.  7.  Contact your doctor if you have any problems that may be related to the medicine you are taking.  8.  Call 911 and bring the patient back to the ED if:        A.  The seizure lasts longer than 5 minutes.       B.  The patient doesn't awaken shortly after the seizure  C.  The patient has new problems such as difficulty seeing, speaking or moving  D.  The patient was injured during the seizure  E.  The patient has a temperature over 102 F (39C)  F.  The patient vomited and now is having trouble breathing
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
## 2016-04-27 ENCOUNTER — Emergency Department (HOSPITAL_BASED_OUTPATIENT_CLINIC_OR_DEPARTMENT_OTHER)
Admission: EM | Admit: 2016-04-27 | Discharge: 2016-04-27 | Disposition: A | Discharge status: Home / Self Care | Payer: Federal, State, Local not specified - PPO | Attending: Emergency Medicine | Admitting: Emergency Medicine

## 2016-04-27 ENCOUNTER — Encounter (HOSPITAL_BASED_OUTPATIENT_CLINIC_OR_DEPARTMENT_OTHER): Payer: Self-pay | Admitting: *Deleted

## 2016-04-27 DIAGNOSIS — S39012A Strain of muscle, fascia and tendon of lower back, initial encounter: Secondary | ICD-10-CM | POA: Insufficient documentation

## 2016-04-27 DIAGNOSIS — Y990 Civilian activity done for income or pay: Secondary | ICD-10-CM | POA: Diagnosis not present

## 2016-04-27 DIAGNOSIS — Z87891 Personal history of nicotine dependence: Secondary | ICD-10-CM | POA: Insufficient documentation

## 2016-04-27 DIAGNOSIS — S3992XA Unspecified injury of lower back, initial encounter: Secondary | ICD-10-CM | POA: Diagnosis present

## 2016-04-27 DIAGNOSIS — X58XXXA Exposure to other specified factors, initial encounter: Secondary | ICD-10-CM | POA: Insufficient documentation

## 2016-04-27 DIAGNOSIS — I10 Essential (primary) hypertension: Secondary | ICD-10-CM | POA: Diagnosis not present

## 2016-04-27 DIAGNOSIS — Y939 Activity, unspecified: Secondary | ICD-10-CM | POA: Insufficient documentation

## 2016-04-27 DIAGNOSIS — Y929 Unspecified place or not applicable: Secondary | ICD-10-CM | POA: Insufficient documentation

## 2016-04-27 MED ORDER — NAPROXEN 500 MG PO TABS: 500.0000 mg | ORAL_TABLET | Freq: Two times a day (BID) | ORAL | 0 refills | Status: DC

## 2016-04-27 MED ORDER — METHOCARBAMOL 500 MG PO TABS: 1000.0000 mg | ORAL_TABLET | Freq: Four times a day (QID) | ORAL | 0 refills | Status: DC

## 2016-04-27 NOTE — ED Triage Notes (Addendum)
Pt states he was working out two days ago and thinks he may have injured his back. Some tingling in hamstrings at times. Denies other symptoms. Hx back surgery.

## 2016-04-27 NOTE — Discharge Instructions (Signed)
Please read and follow all provided instructions.  Your diagnoses today include:  1. Lumbar strain, initial encounter     Tests performed today include:  Vital signs - see below for your results today  Medications prescribed:   Naproxen - anti-inflammatory pain medication  Do not exceed 500mg  naproxen every 12 hours, take with food  You have been prescribed an anti-inflammatory medication or NSAID. Take with food. Take smallest effective dose for the shortest duration needed for your pain. Stop taking if you experience stomach pain or vomiting.    Robaxin (methocarbamol) - muscle relaxer medication  DO NOT drive or perform any activities that require you to be awake and alert because this medicine can make you drowsy.   Take any prescribed medications only as directed.  Home care instructions:   Follow any educational materials contained in this packet  Please rest, use ice or heat on your back for the next several days  Do not lift, push, pull anything more than 10 pounds for the next week  Follow-up instructions: Please follow-up with your primary care provider in the next 1 week for further evaluation of your symptoms.   Return instructions:  SEEK IMMEDIATE MEDICAL ATTENTION IF YOU HAVE:  New numbness, tingling, weakness, or problem with the use of your arms or legs  Severe back pain not relieved with medications  Loss control of your bowels or bladder  Increasing pain in any areas of the body (such as chest or abdominal pain)  Shortness of breath, dizziness, or fainting.   Worsening nausea (feeling sick to your stomach), vomiting, fever, or sweats  Any other emergent concerns regarding your health   Additional Information:  Your vital signs today were: BP 126/83 (BP Location: Left Arm)    Pulse 72    Temp 98.1 F (36.7 C) (Oral)    Resp 18    Ht 6' (1.829 m)    Wt 90.7 kg    SpO2 100%    BMI 27.12 kg/m  If your blood pressure (BP) was elevated above  135/85 this visit, please have this repeated by your doctor within one month. --------------
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
## 2016-07-28 ENCOUNTER — Encounter: Payer: Self-pay | Admitting: Neurology

## 2016-07-28 ENCOUNTER — Ambulatory Visit: Payer: Federal, State, Local not specified - PPO | Admitting: Neurology

## 2016-07-28 ENCOUNTER — Telehealth: Payer: Self-pay | Admitting: Neurology

## 2016-07-28 NOTE — Telephone Encounter (Signed)
Patient dismissed from [facility] by Metta Clines DO , effective July 28, 2016. Dismissal letter sent out by certified / registered mail.  DAJ

## 2016-08-04 ENCOUNTER — Encounter: Payer: Self-pay | Admitting: Family Medicine

## 2016-08-04 ENCOUNTER — Telehealth: Payer: Self-pay | Admitting: Family Medicine

## 2016-08-04 ENCOUNTER — Ambulatory Visit: Payer: Federal, State, Local not specified - PPO | Admitting: Family Medicine

## 2016-08-04 ENCOUNTER — Ambulatory Visit (INDEPENDENT_AMBULATORY_CARE_PROVIDER_SITE_OTHER): Payer: Federal, State, Local not specified - PPO | Admitting: Family Medicine

## 2016-08-04 ENCOUNTER — Telehealth: Payer: Self-pay | Admitting: Neurology

## 2016-08-04 VITALS — BP 100/68 | HR 75 | Temp 97.7°F | Ht 72.0 in | Wt 214.8 lb

## 2016-08-04 DIAGNOSIS — R569 Unspecified convulsions: Secondary | ICD-10-CM

## 2016-08-04 DIAGNOSIS — R51 Headache: Secondary | ICD-10-CM | POA: Diagnosis not present

## 2016-08-04 DIAGNOSIS — G40909 Epilepsy, unspecified, not intractable, without status epilepticus: Secondary | ICD-10-CM | POA: Diagnosis not present

## 2016-08-04 DIAGNOSIS — R519 Headache, unspecified: Secondary | ICD-10-CM

## 2016-08-04 MED ORDER — CARBAMAZEPINE ER 400 MG PO TB12: 400.0000 mg | ORAL_TABLET | Freq: Two times a day (BID) | ORAL | 0 refills | Status: DC

## 2016-08-04 NOTE — Telephone Encounter (Signed)
Teddi/ Dr. Tomi Likens - please advise on refill protocol on dismissed patient.

## 2016-08-04 NOTE — Telephone Encounter (Signed)
Pt dropped off document to be filled out by PCP (FMLA 4 pages). Pt ok to have documents mailed (envelope with Documents). Documents put at front office tray.

## 2016-08-04 NOTE — Telephone Encounter (Signed)
The medical records depart marks the account when the patient is past the 33 days we give for discharged patients.  We have to treat the patient during these 33 days.  If he is out of his meds we can refill for a month and that will get him through the 33 days and he will have gotten his dismissal letter as well.

## 2016-08-04 NOTE — Telephone Encounter (Signed)
Patient needs a refill on the tegreter xr patient phone number [phone]

## 2016-08-04 NOTE — Patient Instructions (Signed)
Take magnesium 400-500 mg daily. This is available over the counter.

## 2016-08-04 NOTE — Addendum Note (Signed)
Addended byAnnamaria Helling on: 08/04/2016 04:22 PM   Modules accepted: Orders
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
## 2016-08-04 NOTE — Telephone Encounter (Signed)
One month supply of Tegretol sent to pharmacy.

## 2016-08-04 NOTE — Telephone Encounter (Signed)
I know but he has 30 days according to teddi so you may want to ask teddi

## 2016-08-04 NOTE — Telephone Encounter (Signed)
Patient dismissed from the practice.

## 2016-08-04 NOTE — Progress Notes (Signed)
Pre visit review using our clinic review tool, if applicable. No additional management support is needed unless otherwise documented below in the visit note. 

## 2016-08-10 NOTE — Telephone Encounter (Signed)
Received completed and signed FMLA forms from Dr. Nani Ravens.   The original completed forms placed in envelope to be mailed.  Copy made to be sent for scanning.//AB/CMA

## 2016-08-10 NOTE — Telephone Encounter (Signed)
Received signed domestic return receipt verifying delivery of certified letter on August 02, 2016. Article number [id number]

## 2016-08-11 ENCOUNTER — Encounter: Payer: Self-pay | Admitting: Neurology

## 2016-08-11 ENCOUNTER — Ambulatory Visit (INDEPENDENT_AMBULATORY_CARE_PROVIDER_SITE_OTHER): Payer: Federal, State, Local not specified - PPO | Admitting: Neurology

## 2016-08-11 VITALS — BP 131/80 | HR 81 | Ht 72.0 in | Wt 214.0 lb

## 2016-08-11 DIAGNOSIS — R561 Post traumatic seizures: Secondary | ICD-10-CM | POA: Diagnosis not present

## 2016-08-11 DIAGNOSIS — Z5181 Encounter for therapeutic drug level monitoring: Secondary | ICD-10-CM

## 2016-08-11 DIAGNOSIS — G40209 Localization-related (focal) (partial) symptomatic epilepsy and epileptic syndromes with complex partial seizures, not intractable, without status epilepticus: Secondary | ICD-10-CM

## 2016-08-11 DIAGNOSIS — R569 Unspecified convulsions: Secondary | ICD-10-CM

## 2016-08-11 MED ORDER — CARBAMAZEPINE ER 400 MG PO TB12: 400.0000 mg | ORAL_TABLET | Freq: Two times a day (BID) | ORAL | 3 refills | Status: DC

## 2016-08-11 MED ORDER — LEVETIRACETAM 750 MG PO TABS: 1500.0000 mg | ORAL_TABLET | Freq: Two times a day (BID) | ORAL | 3 refills | Status: DC
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
## 2016-08-11 NOTE — Patient Instructions (Signed)
    With the Keppra 750 mg tablets, take one in the morning and 2 in the evening for 2 weeks, then take 2 tablets twice a day.

## 2016-08-12 LAB — CBC WITH DIFFERENTIAL/PLATELET
Basophils Absolute: 0 10*3/uL (ref 0.0–0.2)
Basos: 0 %
EOS (ABSOLUTE): 0.1 10*3/uL (ref 0.0–0.4)
Eos: 2 %
HEMOGLOBIN: 14.7 g/dL (ref 13.0–17.7)
Hematocrit: 44 % (ref 37.5–51.0)
Immature Grans (Abs): 0 10*3/uL (ref 0.0–0.1)
Immature Granulocytes: 0 %
LYMPHS ABS: 1.4 10*3/uL (ref 0.7–3.1)
Lymphs: 30 %
MCH: 28.7 pg (ref 26.6–33.0)
MCHC: 33.4 g/dL (ref 31.5–35.7)
MCV: 86 fL (ref 79–97)
MONOCYTES: 13 %
Monocytes Absolute: 0.6 10*3/uL (ref 0.1–0.9)
NEUTROS ABS: 2.6 10*3/uL (ref 1.4–7.0)
Neutrophils: 55 %
Platelets: 251 10*3/uL (ref 150–379)
RBC: 5.13 x10E6/uL (ref 4.14–5.80)
RDW: 14.1 % (ref 12.3–15.4)
WBC: 4.7 10*3/uL (ref 3.4–10.8)

## 2016-08-12 LAB — COMPREHENSIVE METABOLIC PANEL
ALBUMIN: 4.7 g/dL (ref 3.5–5.5)
ALT: 40 IU/L (ref 0–44)
AST: 34 IU/L (ref 0–40)
Albumin/Globulin Ratio: 1.5 (ref 1.2–2.2)
Alkaline Phosphatase: 106 IU/L (ref 39–117)
BUN / CREAT RATIO: 8 — AB (ref 9–20)
BUN: 9 mg/dL (ref 6–20)
Bilirubin Total: <0.2 mg/dL (ref 0.0–1.2)
CO2: 26 mmol/L (ref 18–29)
CREATININE: 1.1 mg/dL (ref 0.76–1.27)
Calcium: 9.5 mg/dL (ref 8.7–10.2)
Chloride: 101 mmol/L (ref 96–106)
GFR calc non Af Amer: 91 mL/min/{1.73_m2} (ref >59)
GFR, EST AFRICAN AMERICAN: 105 mL/min/{1.73_m2} (ref >59)
GLUCOSE: 107 mg/dL — AB (ref 65–99)
Globulin, Total: 3.1 g/dL (ref 1.5–4.5)
Potassium: 4.4 mmol/L (ref 3.5–5.2)
Sodium: 144 mmol/L (ref 134–144)
TOTAL PROTEIN: 7.8 g/dL (ref 6.0–8.5)

## 2016-08-12 LAB — CARBAMAZEPINE LEVEL, TOTAL: Carbamazepine (Tegretol), S: 9.6 ug/mL (ref 4.0–12.0)

## 2016-08-15 ENCOUNTER — Ambulatory Visit: Payer: Federal, State, Local not specified - PPO | Admitting: Neurology

## 2016-08-18 ENCOUNTER — Telehealth: Payer: Self-pay | Admitting: Family Medicine

## 2016-08-18 NOTE — Telephone Encounter (Signed)
Pt dropped off documents to be filled out (FMLA paperwork). Pt would like documents mailed. Pt would like to be informed when documents mailed. (documents are with envelope attached with it). Document put at front office tray.

## 2016-09-13 ENCOUNTER — Emergency Department (HOSPITAL_BASED_OUTPATIENT_CLINIC_OR_DEPARTMENT_OTHER): Payer: Federal, State, Local not specified - PPO

## 2016-09-13 ENCOUNTER — Emergency Department (HOSPITAL_BASED_OUTPATIENT_CLINIC_OR_DEPARTMENT_OTHER)
Admission: EM | Admit: 2016-09-13 | Discharge: 2016-09-13 | Disposition: A | Discharge status: Home / Self Care | Payer: Federal, State, Local not specified - PPO | Attending: Emergency Medicine | Admitting: Emergency Medicine

## 2016-09-13 ENCOUNTER — Encounter (HOSPITAL_BASED_OUTPATIENT_CLINIC_OR_DEPARTMENT_OTHER): Payer: Self-pay | Admitting: Emergency Medicine

## 2016-09-13 DIAGNOSIS — I10 Essential (primary) hypertension: Secondary | ICD-10-CM | POA: Insufficient documentation

## 2016-09-13 DIAGNOSIS — R1013 Epigastric pain: Secondary | ICD-10-CM | POA: Diagnosis not present

## 2016-09-13 DIAGNOSIS — Z79899 Other long term (current) drug therapy: Secondary | ICD-10-CM | POA: Insufficient documentation

## 2016-09-13 DIAGNOSIS — R109 Unspecified abdominal pain: Secondary | ICD-10-CM | POA: Diagnosis not present

## 2016-09-13 DIAGNOSIS — F1290 Cannabis use, unspecified, uncomplicated: Secondary | ICD-10-CM | POA: Insufficient documentation

## 2016-09-13 DIAGNOSIS — Z87891 Personal history of nicotine dependence: Secondary | ICD-10-CM | POA: Diagnosis not present

## 2016-09-13 LAB — COMPREHENSIVE METABOLIC PANEL
ALT: 28 U/L (ref 17–63)
AST: 26 U/L (ref 15–41)
Albumin: 4.5 g/dL (ref 3.5–5.0)
Alkaline Phosphatase: 86 U/L (ref 38–126)
Anion gap: 7 (ref 5–15)
BILIRUBIN TOTAL: 0.3 mg/dL (ref 0.3–1.2)
BUN: 12 mg/dL (ref 6–20)
CHLORIDE: 101 mmol/L (ref 101–111)
CO2: 31 mmol/L (ref 22–32)
CREATININE: 1.14 mg/dL (ref 0.61–1.24)
Calcium: 9.1 mg/dL (ref 8.9–10.3)
GFR calc Af Amer: >60 mL/min (ref >60)
Glucose, Bld: 91 mg/dL (ref 65–99)
Potassium: 3.8 mmol/L (ref 3.5–5.1)
Sodium: 139 mmol/L (ref 135–145)
TOTAL PROTEIN: 7.8 g/dL (ref 6.5–8.1)

## 2016-09-13 LAB — CBC WITH DIFFERENTIAL/PLATELET
BASOS ABS: 0 10*3/uL (ref 0.0–0.1)
Basophils Relative: 0 %
Eosinophils Absolute: 0.1 10*3/uL (ref 0.0–0.7)
Eosinophils Relative: 2 %
HCT: 43.1 % (ref 39.0–52.0)
Hemoglobin: 14.7 g/dL (ref 13.0–17.0)
LYMPHS PCT: 16 %
Lymphs Abs: 1 10*3/uL (ref 0.7–4.0)
MCH: 28.9 pg (ref 26.0–34.0)
MCHC: 34.1 g/dL (ref 30.0–36.0)
MCV: 84.7 fL (ref 78.0–100.0)
Monocytes Absolute: 0.7 10*3/uL (ref 0.1–1.0)
Monocytes Relative: 10 %
NEUTROS ABS: 4.6 10*3/uL (ref 1.7–7.7)
NEUTROS PCT: 72 %
Platelets: 241 10*3/uL (ref 150–400)
RBC: 5.09 MIL/uL (ref 4.22–5.81)
RDW: 12.6 % (ref 11.5–15.5)
WBC: 6.4 10*3/uL (ref 4.0–10.5)

## 2016-09-13 LAB — TROPONIN I: Troponin I: <0.03 ng/mL (ref <0.03)

## 2016-09-13 LAB — LIPASE, BLOOD: LIPASE: 24 U/L (ref 11–51)

## 2016-09-13 MED ORDER — IOPAMIDOL (ISOVUE-300) INJECTION 61%
100.0000 mL | Freq: Once | INTRAVENOUS | Status: AC | PRN
  Administered 2016-09-13: 100 mL via INTRAVENOUS

## 2016-09-13 MED ORDER — FAMOTIDINE 20 MG PO TABS: 20.0000 mg | ORAL_TABLET | Freq: Two times a day (BID) | ORAL | 0 refills | Status: DC

## 2016-09-13 MED ORDER — ESOMEPRAZOLE MAGNESIUM 40 MG PO CPDR: 40.0000 mg | DELAYED_RELEASE_CAPSULE | Freq: Every day | ORAL | 0 refills | Status: DC

## 2016-09-13 MED ORDER — MORPHINE SULFATE (PF) 4 MG/ML IV SOLN
4.0000 mg | Freq: Once | INTRAVENOUS | Status: AC
  Administered 2016-09-13: 4 mg via INTRAVENOUS
  Filled 2016-09-13: qty 1

## 2016-09-13 MED ORDER — PANTOPRAZOLE SODIUM 40 MG IV SOLR
40.0000 mg | Freq: Once | INTRAVENOUS | Status: AC
  Administered 2016-09-13: 40 mg via INTRAVENOUS
  Filled 2016-09-13: qty 40

## 2016-09-13 MED ORDER — GI COCKTAIL ~~LOC~~
30.0000 mL | Freq: Once | ORAL | Status: AC
  Administered 2016-09-13: 30 mL via ORAL
  Filled 2016-09-13: qty 30

## 2016-09-13 MED ORDER — SODIUM CHLORIDE 0.9 % IV BOLUS (SEPSIS)
1000.0000 mL | Freq: Once | INTRAVENOUS | Status: AC
  Administered 2016-09-13: 1000 mL via INTRAVENOUS

## 2016-09-13 MED FILL — ESOMEPRAZOLE MAG DR 40 MG C: 40 | 30 days supply | Qty: 30 | Fill #0

## 2016-09-13 NOTE — ED Triage Notes (Signed)
Patient states that he has a "whole lone" of intermittent burning to his epigastric region, but for the last week he has had more consistent  pain to to his epigastric region. It burns mostly after he eats
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
## 2016-09-13 NOTE — Discharge Instructions (Signed)
Take pepcid twice daily,.   Take nexium 40 mg daily.   Try and eat healthy, less fatty or fried foods.   Call GI doctor next week if you still have pain. You may need endoscopy for further evaluation   Return to ER if you have severe abdominal pain, vomiting, dehydration, fevers

## 2016-10-12 DIAGNOSIS — R198 Other specified symptoms and signs involving the digestive system and abdomen: Secondary | ICD-10-CM | POA: Diagnosis not present

## 2016-10-21 ENCOUNTER — Encounter: Payer: Self-pay | Admitting: Family Medicine

## 2016-10-21 ENCOUNTER — Ambulatory Visit (INDEPENDENT_AMBULATORY_CARE_PROVIDER_SITE_OTHER): Payer: Federal, State, Local not specified - PPO | Admitting: Family Medicine

## 2016-10-21 VITALS — BP 122/75 | HR 79 | Temp 98.0°F | Ht 72.0 in | Wt 218.2 lb

## 2016-10-21 DIAGNOSIS — R454 Irritability and anger: Secondary | ICD-10-CM | POA: Diagnosis not present

## 2016-10-21 NOTE — Patient Instructions (Addendum)
Counseling: Contact [phone] to schedule an appointment or inquire about cost/insurance coverage. This is a second resort if you don't hear anything from our office in the next 1-2 weeks.
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
## 2016-10-21 NOTE — Progress Notes (Signed)
Pre visit review using our clinic review tool, if applicable. No additional management support is needed unless otherwise documented below in the visit note. 

## 2016-10-25 ENCOUNTER — Telehealth: Payer: Self-pay | Admitting: *Deleted

## 2016-10-25 NOTE — Telephone Encounter (Signed)
Received completed and signed FMLA paperwork from Dr. Nani Ravens.  Called and spoke with the pt and informed him that the FMLA is completed and ready to be picked up.  Pt verbalized understanding and stated that he will come by today to pick the forms up.  Informed the pt that I will leave the forms up front.  Pt agreed.  Copy made to be scanned in the chart, and the original placed up front.//AB/CMA

## 2016-11-16 DIAGNOSIS — R198 Other specified symptoms and signs involving the digestive system and abdomen: Secondary | ICD-10-CM | POA: Diagnosis not present

## 2016-11-16 DIAGNOSIS — R1084 Generalized abdominal pain: Secondary | ICD-10-CM | POA: Diagnosis not present

## 2016-11-25 ENCOUNTER — Ambulatory Visit (INDEPENDENT_AMBULATORY_CARE_PROVIDER_SITE_OTHER): Payer: Federal, State, Local not specified - PPO | Admitting: Family Medicine

## 2016-11-25 ENCOUNTER — Encounter: Payer: Self-pay | Admitting: Family Medicine

## 2016-11-25 ENCOUNTER — Other Ambulatory Visit (HOSPITAL_COMMUNITY)
Admission: RE | Admit: 2016-11-25 | Discharge: 2016-11-25 | Disposition: A | Discharge status: Home / Self Care | Payer: Federal, State, Local not specified - PPO | Source: Ambulatory Visit | Attending: Family Medicine | Admitting: Family Medicine

## 2016-11-25 VITALS — BP 120/78 | HR 79 | Temp 98.7°F | Ht 72.0 in | Wt 218.8 lb

## 2016-11-25 DIAGNOSIS — R32 Unspecified urinary incontinence: Secondary | ICD-10-CM

## 2016-11-25 LAB — CBC
HEMATOCRIT: 44.6 % (ref 39.0–52.0)
HEMOGLOBIN: 14.8 g/dL (ref 13.0–17.0)
MCHC: 33.2 g/dL (ref 30.0–36.0)
MCV: 86.3 fl (ref 78.0–100.0)
PLATELETS: 266 10*3/uL (ref 150.0–400.0)
RBC: 5.17 Mil/uL (ref 4.22–5.81)
RDW: 13 % (ref 11.5–15.5)
WBC: 4.8 10*3/uL (ref 4.0–10.5)

## 2016-11-25 LAB — COMPREHENSIVE METABOLIC PANEL
ALBUMIN: 4.5 g/dL (ref 3.5–5.2)
ALK PHOS: 96 U/L (ref 39–117)
ALT: 27 U/L (ref 0–53)
AST: 25 U/L (ref 0–37)
BILIRUBIN TOTAL: 0.2 mg/dL (ref 0.2–1.2)
BUN: 9 mg/dL (ref 6–23)
CALCIUM: 9.4 mg/dL (ref 8.4–10.5)
CO2: 32 mEq/L (ref 19–32)
Chloride: 103 mEq/L (ref 96–112)
Creatinine, Ser: 1.25 mg/dL (ref 0.40–1.50)
GFR: 87.87 mL/min (ref >60.00)
GLUCOSE: 98 mg/dL (ref 70–99)
Potassium: 3.4 mEq/L — ABNORMAL LOW (ref 3.5–5.1)
Sodium: 141 mEq/L (ref 135–145)
TOTAL PROTEIN: 7.7 g/dL (ref 6.0–8.3)

## 2016-11-25 LAB — HEMOGLOBIN A1C: Hgb A1c MFr Bld: 5.9 % (ref 4.6–6.5)

## 2016-11-25 LAB — POC URINALSYSI DIPSTICK (AUTOMATED)
Bilirubin, UA: NEGATIVE
Glucose, UA: NEGATIVE
Ketones, UA: NEGATIVE
LEUKOCYTES UA: NEGATIVE
NITRITE UA: NEGATIVE
Spec Grav, UA: 1.025 (ref 1.010–1.025)
UROBILINOGEN UA: 0.2 U/dL
pH, UA: 7 (ref 5.0–8.0)

## 2016-11-25 NOTE — Progress Notes (Signed)
Pre visit review using our clinic review tool, if applicable. No additional management support is needed unless otherwise documented below in the visit note. 
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
## 2016-11-25 NOTE — Patient Instructions (Signed)
Give us 2-3 business days to get the results of your labs back.  ° °

## 2016-11-27 LAB — URINE CULTURE

## 2016-11-28 ENCOUNTER — Other Ambulatory Visit: Payer: Self-pay | Admitting: Family Medicine

## 2016-11-28 LAB — URINE CYTOLOGY ANCILLARY ONLY
Chlamydia: NEGATIVE
Neisseria Gonorrhea: NEGATIVE
Trichomonas: NEGATIVE

## 2016-11-28 MED ORDER — POTASSIUM CHLORIDE ER 10 MEQ PO TBCR: 10.0000 meq | EXTENDED_RELEASE_TABLET | Freq: Two times a day (BID) | ORAL | 0 refills | Status: DC

## 2016-11-29 ENCOUNTER — Other Ambulatory Visit: Payer: Self-pay | Admitting: Family Medicine

## 2016-11-29 DIAGNOSIS — E876 Hypokalemia: Secondary | ICD-10-CM

## 2016-12-05 ENCOUNTER — Other Ambulatory Visit (INDEPENDENT_AMBULATORY_CARE_PROVIDER_SITE_OTHER): Payer: Federal, State, Local not specified - PPO

## 2016-12-05 DIAGNOSIS — E876 Hypokalemia: Secondary | ICD-10-CM

## 2016-12-05 LAB — BASIC METABOLIC PANEL
BUN: 11 mg/dL (ref 6–23)
CHLORIDE: 102 meq/L (ref 96–112)
CO2: 31 meq/L (ref 19–32)
Calcium: 9.4 mg/dL (ref 8.4–10.5)
Creatinine, Ser: 1.17 mg/dL (ref 0.40–1.50)
GFR: 94.83 mL/min (ref >60.00)
GLUCOSE: 113 mg/dL — AB (ref 70–99)
POTASSIUM: 3.7 meq/L (ref 3.5–5.1)
SODIUM: 139 meq/L (ref 135–145)

## 2017-02-08 ENCOUNTER — Encounter (HOSPITAL_BASED_OUTPATIENT_CLINIC_OR_DEPARTMENT_OTHER): Payer: Self-pay | Admitting: Emergency Medicine

## 2017-02-08 ENCOUNTER — Emergency Department (HOSPITAL_BASED_OUTPATIENT_CLINIC_OR_DEPARTMENT_OTHER)
Admission: EM | Admit: 2017-02-08 | Discharge: 2017-02-08 | Disposition: A | Discharge status: Home / Self Care | Payer: Federal, State, Local not specified - PPO | Attending: Emergency Medicine | Admitting: Emergency Medicine

## 2017-02-08 ENCOUNTER — Emergency Department (HOSPITAL_BASED_OUTPATIENT_CLINIC_OR_DEPARTMENT_OTHER): Payer: Federal, State, Local not specified - PPO

## 2017-02-08 DIAGNOSIS — E860 Dehydration: Secondary | ICD-10-CM | POA: Diagnosis not present

## 2017-02-08 DIAGNOSIS — I10 Essential (primary) hypertension: Secondary | ICD-10-CM | POA: Insufficient documentation

## 2017-02-08 DIAGNOSIS — R51 Headache: Secondary | ICD-10-CM | POA: Insufficient documentation

## 2017-02-08 DIAGNOSIS — Z79899 Other long term (current) drug therapy: Secondary | ICD-10-CM | POA: Diagnosis not present

## 2017-02-08 DIAGNOSIS — R519 Headache, unspecified: Secondary | ICD-10-CM

## 2017-02-08 DIAGNOSIS — F17290 Nicotine dependence, other tobacco product, uncomplicated: Secondary | ICD-10-CM | POA: Diagnosis not present

## 2017-02-08 LAB — CBC WITH DIFFERENTIAL/PLATELET
BASOS ABS: 0 10*3/uL (ref 0.0–0.1)
BASOS PCT: 0 %
Eosinophils Absolute: 0 10*3/uL (ref 0.0–0.7)
Eosinophils Relative: 0 %
HEMATOCRIT: 41.1 % (ref 39.0–52.0)
HEMOGLOBIN: 13.9 g/dL (ref 13.0–17.0)
LYMPHS PCT: 34 %
Lymphs Abs: 1.2 10*3/uL (ref 0.7–4.0)
MCH: 28.7 pg (ref 26.0–34.0)
MCHC: 33.8 g/dL (ref 30.0–36.0)
MCV: 84.9 fL (ref 78.0–100.0)
MONOS PCT: 20 %
Monocytes Absolute: 0.7 10*3/uL (ref 0.1–1.0)
NEUTROS ABS: 1.7 10*3/uL (ref 1.7–7.7)
NEUTROS PCT: 46 %
Platelets: 166 10*3/uL (ref 150–400)
RBC: 4.84 MIL/uL (ref 4.22–5.81)
RDW: 12.8 % (ref 11.5–15.5)
WBC: 3.6 10*3/uL — ABNORMAL LOW (ref 4.0–10.5)

## 2017-02-08 LAB — URINALYSIS, ROUTINE W REFLEX MICROSCOPIC
Bilirubin Urine: NEGATIVE
GLUCOSE, UA: NEGATIVE mg/dL
Ketones, ur: NEGATIVE mg/dL
LEUKOCYTES UA: NEGATIVE
NITRITE: NEGATIVE
PH: 6.5 (ref 5.0–8.0)
Protein, ur: NEGATIVE mg/dL
SPECIFIC GRAVITY, URINE: 1.01 (ref 1.005–1.030)

## 2017-02-08 LAB — BASIC METABOLIC PANEL
ANION GAP: 10 (ref 5–15)
BUN: 9 mg/dL (ref 6–20)
CALCIUM: 8.4 mg/dL — AB (ref 8.9–10.3)
CO2: 26 mmol/L (ref 22–32)
Chloride: 98 mmol/L — ABNORMAL LOW (ref 101–111)
Creatinine, Ser: 1.39 mg/dL — ABNORMAL HIGH (ref 0.61–1.24)
GFR calc non Af Amer: >60 mL/min (ref >60)
Glucose, Bld: 167 mg/dL — ABNORMAL HIGH (ref 65–99)
POTASSIUM: 2.8 mmol/L — AB (ref 3.5–5.1)
Sodium: 134 mmol/L — ABNORMAL LOW (ref 135–145)

## 2017-02-08 LAB — URINALYSIS, MICROSCOPIC (REFLEX)

## 2017-02-08 MED ORDER — ONDANSETRON HCL 4 MG/2ML IJ SOLN
4.0000 mg | Freq: Once | INTRAMUSCULAR | Status: AC
  Administered 2017-02-08: 4 mg via INTRAVENOUS
  Filled 2017-02-08: qty 2

## 2017-02-08 MED ORDER — KETOROLAC TROMETHAMINE 10 MG PO TABS: 10.0000 mg | ORAL_TABLET | Freq: Four times a day (QID) | ORAL | 0 refills | Status: DC | PRN

## 2017-02-08 MED ORDER — SODIUM CHLORIDE 0.9 % IV BOLUS (SEPSIS)
500.0000 mL | Freq: Once | INTRAVENOUS | Status: AC
  Administered 2017-02-08: 500 mL via INTRAVENOUS

## 2017-02-08 MED ORDER — KETOROLAC TROMETHAMINE 30 MG/ML IJ SOLN
30.0000 mg | Freq: Once | INTRAMUSCULAR | Status: AC
  Administered 2017-02-08: 30 mg via INTRAVENOUS
  Filled 2017-02-08: qty 1
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
## 2017-02-08 NOTE — ED Triage Notes (Signed)
Patient reports that he was in [place] last week. Did a lot of activity. Since Monday he has had a "sluggish" feeling and lack of ability to sleep cause he keeps waking up.

## 2017-02-08 NOTE — ED Notes (Signed)
Patient transported to CT 

## 2017-02-10 LAB — ZIKA VIRUS NAA COMPREHENSIVE
ZIKA VIRUS, NAA, SERUM: NEGATIVE
Zika Virus, NAA, Urine: NEGATIVE

## 2017-02-13 ENCOUNTER — Ambulatory Visit (INDEPENDENT_AMBULATORY_CARE_PROVIDER_SITE_OTHER): Payer: Federal, State, Local not specified - PPO | Admitting: Adult Health

## 2017-02-13 ENCOUNTER — Encounter: Payer: Self-pay | Admitting: Adult Health

## 2017-02-13 VITALS — BP 125/84 | HR 69 | Wt 215.8 lb

## 2017-02-13 DIAGNOSIS — Z87820 Personal history of traumatic brain injury: Secondary | ICD-10-CM

## 2017-02-13 DIAGNOSIS — R561 Post traumatic seizures: Secondary | ICD-10-CM | POA: Diagnosis not present

## 2017-02-13 DIAGNOSIS — Z5181 Encounter for therapeutic drug level monitoring: Secondary | ICD-10-CM | POA: Diagnosis not present

## 2017-02-13 NOTE — Progress Notes (Signed)
I have read the note, and I agree with the clinical assessment and plan.  Rosa Wyly KEITH   

## 2017-02-13 NOTE — Patient Instructions (Signed)
Your Plan:  Continue Keppra 1500 mg twice a day and carbamazepine 400 mg twice a day If you have any seizure events please let us know  Thank you for coming to see [place] at [facility]. I hope we have been able to provide you high quality care today.  You may receive a patient satisfaction survey over the next few weeks. We would appreciate your feedback and comments so that we may continue to improve ourselves and the health of our patients.
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
## 2017-02-14 ENCOUNTER — Telehealth: Payer: Self-pay | Admitting: *Deleted

## 2017-02-14 LAB — COMPREHENSIVE METABOLIC PANEL
ALT: 40 IU/L (ref 0–44)
AST: 37 IU/L (ref 0–40)
Albumin/Globulin Ratio: 1.6 (ref 1.2–2.2)
Albumin: 4.8 g/dL (ref 3.5–5.5)
Alkaline Phosphatase: 116 IU/L (ref 39–117)
BUN/Creatinine Ratio: 6 — ABNORMAL LOW (ref 9–20)
BUN: 7 mg/dL (ref 6–20)
Bilirubin Total: 0.2 mg/dL (ref 0.0–1.2)
CHLORIDE: 100 mmol/L (ref 96–106)
CO2: 29 mmol/L (ref 20–29)
CREATININE: 1.11 mg/dL (ref 0.76–1.27)
Calcium: 9.4 mg/dL (ref 8.7–10.2)
GFR, EST AFRICAN AMERICAN: 103 mL/min/{1.73_m2} (ref >59)
GFR, EST NON AFRICAN AMERICAN: 89 mL/min/{1.73_m2} (ref >59)
GLUCOSE: 99 mg/dL (ref 65–99)
Globulin, Total: 3 g/dL (ref 1.5–4.5)
Potassium: 4.1 mmol/L (ref 3.5–5.2)
Sodium: 142 mmol/L (ref 134–144)
TOTAL PROTEIN: 7.8 g/dL (ref 6.0–8.5)

## 2017-02-14 LAB — CBC WITH DIFFERENTIAL/PLATELET
BASOS ABS: 0 10*3/uL (ref 0.0–0.2)
Basos: 1 %
EOS (ABSOLUTE): 0 10*3/uL (ref 0.0–0.4)
Eos: 1 %
HEMOGLOBIN: 14.6 g/dL (ref 13.0–17.7)
Hematocrit: 42.4 % (ref 37.5–51.0)
IMMATURE GRANS (ABS): 0 10*3/uL (ref 0.0–0.1)
Immature Granulocytes: 0 %
LYMPHS: 39 %
Lymphocytes Absolute: 1.3 10*3/uL (ref 0.7–3.1)
MCH: 28.1 pg (ref 26.6–33.0)
MCHC: 34.4 g/dL (ref 31.5–35.7)
MCV: 82 fL (ref 79–97)
MONOCYTES: 13 %
Monocytes Absolute: 0.5 10*3/uL (ref 0.1–0.9)
NEUTROS ABS: 1.6 10*3/uL (ref 1.4–7.0)
Neutrophils: 46 %
Platelets: 196 10*3/uL (ref 150–379)
RBC: 5.2 x10E6/uL (ref 4.14–5.80)
RDW: 14 % (ref 12.3–15.4)
WBC: 3.4 10*3/uL (ref 3.4–10.8)

## 2017-02-14 LAB — CARBAMAZEPINE LEVEL, TOTAL: CARBAMAZEPINE LVL: 5.1 ug/mL (ref 4.0–12.0)

## 2017-02-14 NOTE — Telephone Encounter (Signed)
Spoke with patient and informed him that his lab work is unremarkable. Advised him his Bun is slightly decreased and his Creatinine is in normal range, which are indicators of kidney function. He verbalized understanding, appreciation, had no questions.

## 2017-03-28 ENCOUNTER — Emergency Department (HOSPITAL_BASED_OUTPATIENT_CLINIC_OR_DEPARTMENT_OTHER)
Admission: EM | Admit: 2017-03-28 | Discharge: 2017-03-29 | Disposition: A | Discharge status: Home / Self Care | Payer: Federal, State, Local not specified - PPO | Attending: Emergency Medicine | Admitting: Emergency Medicine

## 2017-03-28 ENCOUNTER — Encounter (HOSPITAL_BASED_OUTPATIENT_CLINIC_OR_DEPARTMENT_OTHER): Payer: Self-pay | Admitting: *Deleted

## 2017-03-28 DIAGNOSIS — Z87891 Personal history of nicotine dependence: Secondary | ICD-10-CM | POA: Insufficient documentation

## 2017-03-28 DIAGNOSIS — Z79899 Other long term (current) drug therapy: Secondary | ICD-10-CM | POA: Diagnosis not present

## 2017-03-28 DIAGNOSIS — K529 Noninfective gastroenteritis and colitis, unspecified: Secondary | ICD-10-CM | POA: Insufficient documentation

## 2017-03-28 DIAGNOSIS — R1110 Vomiting, unspecified: Secondary | ICD-10-CM | POA: Diagnosis not present

## 2017-03-28 LAB — COMPREHENSIVE METABOLIC PANEL
ALK PHOS: 106 U/L (ref 38–126)
ALT: 31 U/L (ref 17–63)
AST: 31 U/L (ref 15–41)
Albumin: 4.8 g/dL (ref 3.5–5.0)
Anion gap: 10 (ref 5–15)
BUN: 11 mg/dL (ref 6–20)
CALCIUM: 9.2 mg/dL (ref 8.9–10.3)
CO2: 27 mmol/L (ref 22–32)
CREATININE: 1.25 mg/dL — AB (ref 0.61–1.24)
Chloride: 100 mmol/L — ABNORMAL LOW (ref 101–111)
Glucose, Bld: 110 mg/dL — ABNORMAL HIGH (ref 65–99)
Potassium: 3.4 mmol/L — ABNORMAL LOW (ref 3.5–5.1)
Sodium: 137 mmol/L (ref 135–145)
TOTAL PROTEIN: 9 g/dL — AB (ref 6.5–8.1)
Total Bilirubin: 0.5 mg/dL (ref 0.3–1.2)

## 2017-03-28 LAB — LIPASE, BLOOD: Lipase: 29 U/L (ref 11–51)

## 2017-03-28 LAB — URINALYSIS, ROUTINE W REFLEX MICROSCOPIC
Glucose, UA: NEGATIVE mg/dL
Ketones, ur: 15 mg/dL — AB
LEUKOCYTES UA: NEGATIVE
NITRITE: NEGATIVE
PROTEIN: 30 mg/dL — AB
Specific Gravity, Urine: 1.036 — ABNORMAL HIGH (ref 1.005–1.030)
pH: 5.5 (ref 5.0–8.0)

## 2017-03-28 LAB — URINALYSIS, MICROSCOPIC (REFLEX)
SQUAMOUS EPITHELIAL / LPF: NONE SEEN
WBC, UA: NONE SEEN WBC/hpf (ref 0–5)

## 2017-03-28 LAB — CBC
HCT: 46.5 % (ref 39.0–52.0)
Hemoglobin: 15.8 g/dL (ref 13.0–17.0)
MCH: 28.4 pg (ref 26.0–34.0)
MCHC: 34 g/dL (ref 30.0–36.0)
MCV: 83.6 fL (ref 78.0–100.0)
PLATELETS: 233 10*3/uL (ref 150–400)
RBC: 5.56 MIL/uL (ref 4.22–5.81)
RDW: 13.4 % (ref 11.5–15.5)
WBC: 10 10*3/uL (ref 4.0–10.5)

## 2017-03-28 MED ORDER — SODIUM CHLORIDE 0.9 % IV BOLUS (SEPSIS)
1000.0000 mL | Freq: Once | INTRAVENOUS | Status: AC
  Administered 2017-03-28: 1000 mL via INTRAVENOUS

## 2017-03-28 MED ORDER — ACETAMINOPHEN 500 MG PO TABS
1000.0000 mg | ORAL_TABLET | Freq: Once | ORAL | Status: AC
Start: 2017-03-29 — End: 2017-03-28
  Administered 2017-03-28: 1000 mg via ORAL
  Filled 2017-03-28: qty 2

## 2017-03-28 MED ORDER — ONDANSETRON HCL 4 MG/2ML IJ SOLN
4.0000 mg | Freq: Once | INTRAMUSCULAR | Status: AC | PRN
  Administered 2017-03-28: 4 mg via INTRAVENOUS
  Filled 2017-03-28: qty 2
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
## 2017-03-28 NOTE — ED Triage Notes (Addendum)
Pt c/o n/v/d x 12 hrs , family at home with same

## 2017-03-29 MED ORDER — ONDANSETRON 8 MG PO TBDP: 8.0000 mg | ORAL_TABLET | Freq: Three times a day (TID) | ORAL | 0 refills | Status: DC | PRN

## 2017-03-29 NOTE — ED Notes (Signed)
Pt verbalizes he feels better, is tolerating PO fluids

## 2017-03-29 NOTE — ED Notes (Signed)
Pt verbalizes understanding of d/c instructions and denies any further needs at this time. 

## 2017-03-29 NOTE — ED Notes (Signed)
ED Provider at bedside. 

## 2017-08-21 ENCOUNTER — Telehealth: Payer: Self-pay | Admitting: *Deleted

## 2017-08-21 ENCOUNTER — Ambulatory Visit: Payer: Federal, State, Local not specified - PPO | Admitting: Adult Health

## 2017-08-21 NOTE — Telephone Encounter (Signed)
Patient was no show for follow up with NP today.  

## 2017-08-22 ENCOUNTER — Encounter: Payer: Self-pay | Admitting: Adult Health

## 2017-08-29 ENCOUNTER — Other Ambulatory Visit: Payer: Self-pay | Admitting: Neurology

## 2017-08-29 ENCOUNTER — Other Ambulatory Visit: Payer: Self-pay | Admitting: *Deleted

## 2017-08-29 DIAGNOSIS — R569 Unspecified convulsions: Secondary | ICD-10-CM

## 2017-08-29 MED ORDER — LEVETIRACETAM 750 MG PO TABS: 1500.0000 mg | ORAL_TABLET | Freq: Two times a day (BID) | ORAL | 0 refills | Status: DC

## 2017-08-29 MED ORDER — CARBAMAZEPINE ER 400 MG PO TB12: 400.0000 mg | ORAL_TABLET | Freq: Two times a day (BID) | ORAL | 0 refills | Status: DC

## 2017-11-21 ENCOUNTER — Encounter: Payer: Self-pay | Admitting: Adult Health

## 2017-11-27 ENCOUNTER — Ambulatory Visit: Payer: Federal, State, Local not specified - PPO | Admitting: Adult Health

## 2017-11-28 ENCOUNTER — Other Ambulatory Visit: Payer: Self-pay | Admitting: Adult Health

## 2017-11-28 DIAGNOSIS — R569 Unspecified convulsions: Secondary | ICD-10-CM

## 2017-12-04 ENCOUNTER — Encounter: Payer: Self-pay | Admitting: Adult Health

## 2017-12-04 ENCOUNTER — Ambulatory Visit: Payer: Federal, State, Local not specified - PPO | Admitting: Adult Health

## 2017-12-04 VITALS — BP 123/72 | HR 78 | Ht 72.0 in | Wt 228.6 lb

## 2017-12-04 DIAGNOSIS — Z87820 Personal history of traumatic brain injury: Secondary | ICD-10-CM | POA: Diagnosis not present

## 2017-12-04 DIAGNOSIS — Z5181 Encounter for therapeutic drug level monitoring: Secondary | ICD-10-CM

## 2017-12-04 DIAGNOSIS — R569 Unspecified convulsions: Secondary | ICD-10-CM

## 2017-12-04 MED ORDER — LEVETIRACETAM 750 MG PO TABS: 1500.0000 mg | ORAL_TABLET | Freq: Two times a day (BID) | ORAL | 3 refills | Status: DC

## 2017-12-04 MED ORDER — CARBAMAZEPINE ER 400 MG PO TB12: 400.0000 mg | ORAL_TABLET | Freq: Two times a day (BID) | ORAL | 3 refills | Status: DC

## 2017-12-04 NOTE — Progress Notes (Signed)
I have read the note, and I agree with the clinical assessment and plan.  Mikah Rottinghaus K Trana Ressler   
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
## 2017-12-05 ENCOUNTER — Telehealth: Payer: Self-pay | Admitting: *Deleted

## 2017-12-05 LAB — CBC WITH DIFFERENTIAL/PLATELET
BASOS ABS: 0 10*3/uL (ref 0.0–0.2)
Basos: 0 %
EOS (ABSOLUTE): 0.2 10*3/uL (ref 0.0–0.4)
EOS: 3 %
HEMATOCRIT: 43.8 % (ref 37.5–51.0)
Hemoglobin: 14.9 g/dL (ref 13.0–17.7)
Immature Grans (Abs): 0 10*3/uL (ref 0.0–0.1)
Immature Granulocytes: 0 %
LYMPHS ABS: 1.8 10*3/uL (ref 0.7–3.1)
Lymphs: 38 %
MCH: 29.3 pg (ref 26.6–33.0)
MCHC: 34 g/dL (ref 31.5–35.7)
MCV: 86 fL (ref 79–97)
MONOS ABS: 0.4 10*3/uL (ref 0.1–0.9)
Monocytes: 10 %
NEUTROS ABS: 2.3 10*3/uL (ref 1.4–7.0)
Neutrophils: 49 %
Platelets: 202 10*3/uL (ref 150–379)
RBC: 5.08 x10E6/uL (ref 4.14–5.80)
RDW: 14.1 % (ref 12.3–15.4)
WBC: 4.6 10*3/uL (ref 3.4–10.8)

## 2017-12-05 LAB — COMPREHENSIVE METABOLIC PANEL
ALBUMIN: 4.7 g/dL (ref 3.5–5.5)
ALK PHOS: 110 IU/L (ref 39–117)
ALT: 24 IU/L (ref 0–44)
AST: 25 IU/L (ref 0–40)
Albumin/Globulin Ratio: 1.7 (ref 1.2–2.2)
BUN / CREAT RATIO: 8 — AB (ref 9–20)
BUN: 9 mg/dL (ref 6–20)
CHLORIDE: 103 mmol/L (ref 96–106)
CO2: 26 mmol/L (ref 20–29)
Calcium: 9.5 mg/dL (ref 8.7–10.2)
Creatinine, Ser: 1.12 mg/dL (ref 0.76–1.27)
GFR calc Af Amer: 102 mL/min/{1.73_m2} (ref >59)
GFR calc non Af Amer: 88 mL/min/{1.73_m2} (ref >59)
GLOBULIN, TOTAL: 2.7 g/dL (ref 1.5–4.5)
Glucose: 101 mg/dL — ABNORMAL HIGH (ref 65–99)
Potassium: 4.2 mmol/L (ref 3.5–5.2)
SODIUM: 142 mmol/L (ref 134–144)
Total Protein: 7.4 g/dL (ref 6.0–8.5)

## 2017-12-05 LAB — CARBAMAZEPINE LEVEL, TOTAL: Carbamazepine (Tegretol), S: 7.5 ug/mL (ref 4.0–12.0)

## 2017-12-05 NOTE — Telephone Encounter (Signed)
LVM informing patient his blood work is relatively unremarkable. Advised he continue to take prescribed medications as ordered. Left number for any questions.

## 2017-12-05 NOTE — Telephone Encounter (Signed)
Attempted to reach patient on cell. Phone was answered but no one spoke. If patient calls back phone staff may inform him his lab results are unremarkable.

## 2018-01-15 DIAGNOSIS — K080 Exfoliation of teeth due to systemic causes: Secondary | ICD-10-CM | POA: Diagnosis not present

## 2018-01-29 DIAGNOSIS — K080 Exfoliation of teeth due to systemic causes: Secondary | ICD-10-CM | POA: Diagnosis not present

## 2018-02-19 DIAGNOSIS — K080 Exfoliation of teeth due to systemic causes: Secondary | ICD-10-CM | POA: Diagnosis not present

## 2018-02-26 DIAGNOSIS — K080 Exfoliation of teeth due to systemic causes: Secondary | ICD-10-CM | POA: Diagnosis not present

## 2018-03-05 DIAGNOSIS — K080 Exfoliation of teeth due to systemic causes: Secondary | ICD-10-CM | POA: Diagnosis not present

## 2018-03-12 DIAGNOSIS — K080 Exfoliation of teeth due to systemic causes: Secondary | ICD-10-CM | POA: Diagnosis not present

## 2018-06-14 ENCOUNTER — Ambulatory Visit: Payer: Federal, State, Local not specified - PPO | Admitting: Family Medicine

## 2018-06-14 DIAGNOSIS — Z0289 Encounter for other administrative examinations: Secondary | ICD-10-CM

## 2018-06-15 ENCOUNTER — Encounter: Payer: Self-pay | Admitting: Family Medicine

## 2018-11-21 ENCOUNTER — Telehealth: Payer: Self-pay | Admitting: Neurology

## 2018-11-21 NOTE — Telephone Encounter (Signed)
This is a [facility]/Megan NP patient who needs to be rescheduled due to MM being out. Patient can be called and converted to video visit with Judson Roch NP.

## 2018-11-22 NOTE — Telephone Encounter (Signed)
Unable to get in contact with the patient to schedule a webex visit with Butler Denmark, NP for 11/29/2018. I left a voicemail asking him to return my call. Office number was provided.

## 2018-11-29 ENCOUNTER — Other Ambulatory Visit: Payer: Self-pay | Admitting: Adult Health

## 2018-11-29 DIAGNOSIS — R569 Unspecified convulsions: Secondary | ICD-10-CM

## 2018-12-05 NOTE — Telephone Encounter (Signed)
Attempted to contact patient to reschedule as he was on Megan NP's schedule while she is out of office. Patient did not answer but I left a voicemail advising patient to reschedule. Patient's appointment was cancelled.

## 2018-12-06 ENCOUNTER — Ambulatory Visit: Payer: Federal, State, Local not specified - PPO | Admitting: Adult Health

## 2019-01-24 ENCOUNTER — Telehealth (INDEPENDENT_AMBULATORY_CARE_PROVIDER_SITE_OTHER): Payer: Federal, State, Local not specified - PPO | Admitting: Adult Health

## 2019-01-24 DIAGNOSIS — R569 Unspecified convulsions: Secondary | ICD-10-CM

## 2019-01-24 MED ORDER — LEVETIRACETAM 750 MG PO TABS: 1500.0000 mg | ORAL_TABLET | Freq: Two times a day (BID) | ORAL | 3 refills | Status: DC

## 2019-01-24 MED ORDER — CARBAMAZEPINE ER 400 MG PO TB12: 400.0000 mg | ORAL_TABLET | Freq: Two times a day (BID) | ORAL | 3 refills | Status: DC
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
## 2019-01-24 NOTE — Progress Notes (Signed)
I have read the note, and I agree with the clinical assessment and plan.  Charles K Willis   

## 2019-01-28 ENCOUNTER — Telehealth: Payer: Self-pay

## 2019-01-28 NOTE — Telephone Encounter (Signed)
DMV form has been signed by the NP and sent to medical records for processing.

## 2019-02-01 ENCOUNTER — Other Ambulatory Visit: Payer: Self-pay

## 2019-02-01 ENCOUNTER — Encounter (HOSPITAL_BASED_OUTPATIENT_CLINIC_OR_DEPARTMENT_OTHER): Payer: Self-pay | Admitting: *Deleted

## 2019-02-01 ENCOUNTER — Emergency Department (HOSPITAL_BASED_OUTPATIENT_CLINIC_OR_DEPARTMENT_OTHER)
Admission: EM | Admit: 2019-02-01 | Discharge: 2019-02-01 | Disposition: A | Discharge status: Home / Self Care | Payer: Federal, State, Local not specified - PPO | Attending: Emergency Medicine | Admitting: Emergency Medicine

## 2019-02-01 DIAGNOSIS — U071 COVID-19: Secondary | ICD-10-CM

## 2019-02-01 DIAGNOSIS — R509 Fever, unspecified: Secondary | ICD-10-CM | POA: Diagnosis not present

## 2019-02-01 DIAGNOSIS — Z20828 Contact with and (suspected) exposure to other viral communicable diseases: Secondary | ICD-10-CM | POA: Insufficient documentation

## 2019-02-01 DIAGNOSIS — Z87891 Personal history of nicotine dependence: Secondary | ICD-10-CM | POA: Diagnosis not present

## 2019-02-01 NOTE — ED Notes (Signed)
C/o low grade temp 100.4, loss of test and smell x 2-3 days  Had neg covid test around the 24  Has been w someone that is pos
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
## 2019-02-01 NOTE — ED Triage Notes (Addendum)
Fever, loss of taste, headache. He was exposed to someone with Covid 19. He has had a test that was negative.

## 2019-03-06 DIAGNOSIS — S6992XA Unspecified injury of left wrist, hand and finger(s), initial encounter: Secondary | ICD-10-CM | POA: Diagnosis not present

## 2019-03-06 DIAGNOSIS — M25532 Pain in left wrist: Secondary | ICD-10-CM | POA: Diagnosis not present

## 2019-03-06 DIAGNOSIS — S59912A Unspecified injury of left forearm, initial encounter: Secondary | ICD-10-CM | POA: Diagnosis not present

## 2019-03-06 DIAGNOSIS — M79632 Pain in left forearm: Secondary | ICD-10-CM | POA: Diagnosis not present

## 2019-08-23 ENCOUNTER — Ambulatory Visit: Payer: Federal, State, Local not specified - PPO | Attending: Internal Medicine

## 2019-08-23 DIAGNOSIS — Z20822 Contact with and (suspected) exposure to covid-19: Secondary | ICD-10-CM

## 2019-08-24 LAB — NOVEL CORONAVIRUS, NAA: SARS-CoV-2, NAA: NOT DETECTED

## 2019-11-15 ENCOUNTER — Ambulatory Visit: Payer: Federal, State, Local not specified - PPO | Attending: Internal Medicine

## 2019-11-15 DIAGNOSIS — Z23 Encounter for immunization: Secondary | ICD-10-CM

## 2019-11-15 NOTE — Progress Notes (Signed)
   Covid-19 Vaccination Clinic  Name:  Dakota Evans    MRN: [id number] DOB: 1986-05-05  11/15/2019  Dakota Evans was observed post Covid-19 immunization for 15 minutes without incident. He was provided with Vaccine Information Sheet and instruction to access the V-Safe system.   Dakota Evans was instructed to call 911 with any severe reactions post vaccine: Marland Kitchen Difficulty breathing  . Swelling of face and throat  . A fast heartbeat  . A bad rash all over body  . Dizziness and weakness   Immunizations Administered    Name Date Dose VIS Date Route   Pfizer COVID-19 Vaccine 11/15/2019 12:54 PM 0.3 mL 07/19/2019 Intramuscular   Manufacturer: [place]   Lot: [id number]   [place]: [id number]

## 2019-12-10 ENCOUNTER — Ambulatory Visit: Payer: Federal, State, Local not specified - PPO | Attending: Internal Medicine

## 2019-12-10 DIAGNOSIS — Z23 Encounter for immunization: Secondary | ICD-10-CM

## 2019-12-10 NOTE — Progress Notes (Signed)
   Covid-19 Vaccination Clinic  Name:  Dakota Evans    MRN: [id number] DOB: 07/03/1986  12/10/2019  [name] was observed post Covid-19 immunization for 15 minutes without incident. He was provided with Vaccine Information Sheet and instruction to access the V-Safe system.   [name] was instructed to call 911 with any severe reactions post vaccine: Marland Kitchen Difficulty breathing  . Swelling of face and throat  . A fast heartbeat  . A bad rash all over body  . Dizziness and weakness   Immunizations Administered    Name Date Dose VIS Date Route   Pfizer COVID-19 Vaccine 12/10/2019  1:03 PM 0.3 mL 10/02/2018 Intramuscular   Manufacturer: [place]   Lot: [id number]   [place]: [id number]

## 2020-01-20 ENCOUNTER — Ambulatory Visit: Payer: Federal, State, Local not specified - PPO | Admitting: Adult Health

## 2020-01-20 ENCOUNTER — Encounter: Payer: Self-pay | Admitting: Adult Health

## 2020-01-30 ENCOUNTER — Encounter: Payer: Self-pay | Admitting: Adult Health

## 2020-01-30 ENCOUNTER — Ambulatory Visit: Payer: Federal, State, Local not specified - PPO | Admitting: Adult Health

## 2020-01-30 ENCOUNTER — Other Ambulatory Visit: Payer: Self-pay

## 2020-01-30 VITALS — BP 121/74 | HR 82 | Ht 72.0 in | Wt 225.0 lb

## 2020-01-30 DIAGNOSIS — R569 Unspecified convulsions: Secondary | ICD-10-CM

## 2020-01-30 MED ORDER — CARBAMAZEPINE ER 400 MG PO TB12: 400.0000 mg | ORAL_TABLET | Freq: Two times a day (BID) | ORAL | 3 refills | Status: DC

## 2020-01-30 MED ORDER — LEVETIRACETAM 750 MG PO TABS: 1500.0000 mg | ORAL_TABLET | Freq: Two times a day (BID) | ORAL | 3 refills | Status: DC

## 2020-01-30 NOTE — Patient Instructions (Signed)
Continue keppra and Carbamazepine  Blood work today If you have any seizure events please let us know.
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
## 2020-01-31 LAB — COMPREHENSIVE METABOLIC PANEL
ALT: 25 IU/L (ref 0–44)
AST: 25 IU/L (ref 0–40)
Albumin/Globulin Ratio: 1.6 (ref 1.2–2.2)
Albumin: 4.7 g/dL (ref 4.0–5.0)
Alkaline Phosphatase: 123 IU/L — ABNORMAL HIGH (ref 48–121)
BUN/Creatinine Ratio: 8 — ABNORMAL LOW (ref 9–20)
BUN: 10 mg/dL (ref 6–20)
Bilirubin Total: <0.2 mg/dL (ref 0.0–1.2)
CO2: 25 mmol/L (ref 20–29)
Calcium: 9.6 mg/dL (ref 8.7–10.2)
Chloride: 102 mmol/L (ref 96–106)
Creatinine, Ser: 1.23 mg/dL (ref 0.76–1.27)
GFR calc Af Amer: 89 mL/min/{1.73_m2} (ref >59)
GFR calc non Af Amer: 77 mL/min/{1.73_m2} (ref >59)
Globulin, Total: 2.9 g/dL (ref 1.5–4.5)
Glucose: 96 mg/dL (ref 65–99)
Potassium: 4.1 mmol/L (ref 3.5–5.2)
Sodium: 140 mmol/L (ref 134–144)
Total Protein: 7.6 g/dL (ref 6.0–8.5)

## 2020-01-31 LAB — CBC WITH DIFFERENTIAL/PLATELET
Basophils Absolute: 0 10*3/uL (ref 0.0–0.2)
Basos: 0 %
EOS (ABSOLUTE): 0.1 10*3/uL (ref 0.0–0.4)
Eos: 2 %
Hematocrit: 46.1 % (ref 37.5–51.0)
Hemoglobin: 15.3 g/dL (ref 13.0–17.7)
Immature Grans (Abs): 0 10*3/uL (ref 0.0–0.1)
Immature Granulocytes: 0 %
Lymphocytes Absolute: 1.6 10*3/uL (ref 0.7–3.1)
Lymphs: 32 %
MCH: 28.3 pg (ref 26.6–33.0)
MCHC: 33.2 g/dL (ref 31.5–35.7)
MCV: 85 fL (ref 79–97)
Monocytes Absolute: 0.6 10*3/uL (ref 0.1–0.9)
Monocytes: 12 %
Neutrophils Absolute: 2.6 10*3/uL (ref 1.4–7.0)
Neutrophils: 54 %
Platelets: 244 10*3/uL (ref 150–450)
RBC: 5.41 x10E6/uL (ref 4.14–5.80)
RDW: 13.3 % (ref 11.6–15.4)
WBC: 4.9 10*3/uL (ref 3.4–10.8)

## 2020-01-31 LAB — CARBAMAZEPINE LEVEL, TOTAL: Carbamazepine (Tegretol), S: 4.6 ug/mL (ref 4.0–12.0)

## 2020-02-02 DIAGNOSIS — Z20822 Contact with and (suspected) exposure to covid-19: Secondary | ICD-10-CM | POA: Diagnosis not present

## 2020-02-03 ENCOUNTER — Ambulatory Visit: Payer: Federal, State, Local not specified - PPO | Attending: Internal Medicine

## 2020-02-04 ENCOUNTER — Telehealth: Payer: Self-pay

## 2020-02-04 NOTE — Telephone Encounter (Signed)
-----   Message from Ward Givens, NP sent at 02/03/2020  1:33 PM EDT ----- Labs results are unremarkable. Please call patient with results.

## 2020-02-04 NOTE — Telephone Encounter (Signed)
Attempted to call pt, Mailbox full  mychart message sent

## 2020-08-19 ENCOUNTER — Encounter: Payer: Self-pay | Admitting: Adult Health

## 2020-12-17 ENCOUNTER — Other Ambulatory Visit: Payer: Self-pay

## 2020-12-17 ENCOUNTER — Emergency Department (HOSPITAL_BASED_OUTPATIENT_CLINIC_OR_DEPARTMENT_OTHER)
Admission: EM | Admit: 2020-12-17 | Discharge: 2020-12-17 | Disposition: A | Discharge status: Home / Self Care | Payer: Federal, State, Local not specified - PPO | Attending: Emergency Medicine | Admitting: Emergency Medicine

## 2020-12-17 ENCOUNTER — Encounter (HOSPITAL_BASED_OUTPATIENT_CLINIC_OR_DEPARTMENT_OTHER): Payer: Self-pay | Admitting: Emergency Medicine

## 2020-12-17 DIAGNOSIS — I10 Essential (primary) hypertension: Secondary | ICD-10-CM | POA: Insufficient documentation

## 2020-12-17 DIAGNOSIS — Z20822 Contact with and (suspected) exposure to covid-19: Secondary | ICD-10-CM | POA: Insufficient documentation

## 2020-12-17 DIAGNOSIS — J0100 Acute maxillary sinusitis, unspecified: Secondary | ICD-10-CM | POA: Diagnosis not present

## 2020-12-17 DIAGNOSIS — J0101 Acute recurrent maxillary sinusitis: Secondary | ICD-10-CM | POA: Diagnosis not present

## 2020-12-17 DIAGNOSIS — Z87891 Personal history of nicotine dependence: Secondary | ICD-10-CM | POA: Diagnosis not present

## 2020-12-17 DIAGNOSIS — R059 Cough, unspecified: Secondary | ICD-10-CM | POA: Diagnosis not present

## 2020-12-17 MED ORDER — PSEUDOEPHEDRINE HCL 30 MG PO TABS: 30.0000 mg | ORAL_TABLET | ORAL | 0 refills | Status: DC | PRN

## 2020-12-17 MED ORDER — AFRIN NASAL SPRAY 0.05 % NA SOLN: 1.0000 | Freq: Two times a day (BID) | NASAL | 0 refills | Status: DC

## 2020-12-17 NOTE — ED Triage Notes (Signed)
Pt arrives pov with c/o cough, congestion and chills x 3 days. Pt denies shob. Temp 98.5 in triage

## 2020-12-17 NOTE — Discharge Instructions (Signed)
Please read and follow all provided instructions.  Your diagnoses today include:  1. Acute non-recurrent maxillary sinusitis     You appear to have an upper respiratory infection (URI). An upper respiratory tract infection, or cold, is a viral infection of the air passages leading to the lungs. It should improve gradually after 5-7 days. You may have a lingering cough that lasts for 2- 4 weeks after the infection.  Tests performed today include:  Vital signs. See below for your results today.   Medications prescribed:   Oxymetazoline - nasal spray for congestion. Do not use for more than 3 days because this medicine can cause rebound congestion. Take a week off before using again.    Sudafed - decongestant medication  Take any prescribed medications only as directed. Treatment for your infection is aimed at treating the symptoms. There are no medications, such as antibiotics, that will cure your infection.   Home care instructions:  Follow any educational materials contained in this packet.   Your illness is contagious and can be spread to others, especially during the first 3 or 4 days. It cannot be cured by antibiotics or other medicines. Take basic precautions such as washing your hands often, covering your mouth when you cough or sneeze, and avoiding public places where you could spread your illness to others.   Please continue drinking plenty of fluids.  Use over-the-counter medicines as needed as directed on packaging for symptom relief.  You may also use ibuprofen or tylenol as directed on packaging for pain or fever.  Do not take multiple medicines containing Tylenol or acetaminophen to avoid taking too much of this medication.  Follow-up instructions: Please follow-up with your primary care provider in the next 3 days for further evaluation of your symptoms if you are not feeling better.   Return instructions:   Please return to the Emergency Department if you experience  worsening symptoms.   RETURN IMMEDIATELY IF you develop shortness of breath, confusion or altered mental status, a new rash, become dizzy, faint, or poorly responsive, or are unable to be cared for at home.  Please return if you have persistent vomiting and cannot keep down fluids or develop a fever that is not controlled by tylenol or motrin.    Please return if you have any other emergent concerns.  Additional Information:  Your vital signs today were: BP (!) 130/96 (BP Location: Left Arm)   Pulse 97   Temp 98.5 F (36.9 C)   Resp 16   Ht 6' (1.829 m)   Wt 99.8 kg   SpO2 99%   BMI 29.84 kg/m  If your blood pressure (BP) was elevated above 135/85 this visit, please have this repeated by your doctor within one month. --------------
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
## 2020-12-18 LAB — SARS CORONAVIRUS 2 (TAT 6-24 HRS): SARS Coronavirus 2: NEGATIVE

## 2020-12-24 ENCOUNTER — Other Ambulatory Visit: Payer: Self-pay

## 2020-12-24 ENCOUNTER — Encounter (HOSPITAL_BASED_OUTPATIENT_CLINIC_OR_DEPARTMENT_OTHER): Payer: Self-pay | Admitting: *Deleted

## 2020-12-24 DIAGNOSIS — R0789 Other chest pain: Secondary | ICD-10-CM | POA: Diagnosis not present

## 2020-12-24 DIAGNOSIS — R0981 Nasal congestion: Secondary | ICD-10-CM | POA: Diagnosis not present

## 2020-12-24 DIAGNOSIS — I10 Essential (primary) hypertension: Secondary | ICD-10-CM | POA: Insufficient documentation

## 2020-12-24 DIAGNOSIS — Z87891 Personal history of nicotine dependence: Secondary | ICD-10-CM | POA: Insufficient documentation

## 2020-12-24 DIAGNOSIS — J3489 Other specified disorders of nose and nasal sinuses: Secondary | ICD-10-CM | POA: Diagnosis not present

## 2020-12-24 NOTE — ED Triage Notes (Signed)
Sinus congestion x 1 week

## 2020-12-25 ENCOUNTER — Emergency Department (HOSPITAL_BASED_OUTPATIENT_CLINIC_OR_DEPARTMENT_OTHER)
Admission: EM | Admit: 2020-12-25 | Discharge: 2020-12-25 | Disposition: A | Discharge status: Home / Self Care | Payer: Federal, State, Local not specified - PPO | Attending: Emergency Medicine | Admitting: Emergency Medicine

## 2020-12-25 DIAGNOSIS — J349 Unspecified disorder of nose and nasal sinuses: Secondary | ICD-10-CM

## 2020-12-25 MED ORDER — AMOXICILLIN-POT CLAVULANATE 875-125 MG PO TABS: 1.0000 | ORAL_TABLET | Freq: Two times a day (BID) | ORAL | 0 refills | Status: DC
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
## 2020-12-25 NOTE — Discharge Instructions (Signed)
Call your primary care doctor or specialist as discussed in the next 2-3 days.   Return immediately back to the ER if:  Your symptoms worsen within the next 12-24 hours. You develop new symptoms such as new fevers, persistent vomiting, new pain, shortness of breath, or new weakness or numbness, or if you have any other concerns.  

## 2020-12-25 NOTE — ED Notes (Signed)
Patient refused covid test

## 2021-03-02 ENCOUNTER — Telehealth: Payer: Federal, State, Local not specified - PPO | Admitting: Adult Health

## 2021-03-09 ENCOUNTER — Telehealth: Payer: Federal, State, Local not specified - PPO | Admitting: Adult Health

## 2021-03-14 ENCOUNTER — Other Ambulatory Visit: Payer: Self-pay | Admitting: Adult Health

## 2021-03-14 ENCOUNTER — Telehealth: Payer: Self-pay | Admitting: Neurology

## 2021-03-14 DIAGNOSIS — R569 Unspecified convulsions: Secondary | ICD-10-CM

## 2021-03-14 NOTE — Telephone Encounter (Signed)
Seizure Patient stated his medication refills were all expired in spite of a recent visit at [facility]/ Needs urgent refill of keppra and tegretol.   Refilled today, Sunday 03-14-2021. CD

## 2021-07-05 ENCOUNTER — Other Ambulatory Visit: Payer: Self-pay

## 2021-07-05 ENCOUNTER — Ambulatory Visit
Admission: EM | Admit: 2021-07-05 | Discharge: 2021-07-05 | Disposition: A | Discharge status: Home / Self Care | Payer: Federal, State, Local not specified - PPO | Attending: Emergency Medicine | Admitting: Emergency Medicine

## 2021-07-05 DIAGNOSIS — J101 Influenza due to other identified influenza virus with other respiratory manifestations: Secondary | ICD-10-CM | POA: Diagnosis not present

## 2021-07-05 DIAGNOSIS — R051 Acute cough: Secondary | ICD-10-CM | POA: Diagnosis not present

## 2021-07-05 DIAGNOSIS — R52 Pain, unspecified: Secondary | ICD-10-CM

## 2021-07-05 LAB — POCT INFLUENZA A/B
Influenza A, POC: POSITIVE — AB
Influenza B, POC: NEGATIVE

## 2021-07-05 MED ORDER — IBUPROFEN 800 MG PO TABS: 800.0000 mg | ORAL_TABLET | Freq: Three times a day (TID) | ORAL | 0 refills | Status: DC

## 2021-07-05 MED ORDER — IBUPROFEN 800 MG PO TABS
800.0000 mg | ORAL_TABLET | Freq: Once | ORAL | Status: AC
  Administered 2021-07-05: 13:00:00 800 mg via ORAL

## 2021-07-05 NOTE — ED Triage Notes (Signed)
5 day h/o HA, sinus pressure, cough, congestion, chills and back pain. Body aches are interfering with his sleep. Has been taking ibuprofen w/temporary relief. Has also taken mucinex but stopped because he felt like he was having an allergic reaction. Negative flu and covid rapid tests completed at [facility]. Denies nausea and emesis.
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
## 2021-07-05 NOTE — Discharge Instructions (Addendum)
Your symptoms are most consistent with a viral upper respiratory illness.  Rapid influenza testing today was positive.  Because you have had symptoms for the past 5 days, Tamiflu would be of no benefit for you.  The mainstay of therapy for influenza is conservative care.  Please remain home from work, school, public places until you have been fever free for 24 hours without the use of antifever medications such as Tylenol or ibuprofen.  Conservative care is recommended at this time.  This includes rest, pushing clear fluids and activity as tolerated.  You may also noticed that your appetite is reduced, this is okay as long as they are drinking plenty of clear fluids.  Acetaminophen (Tylenol): This is a good fever reducer.  If there body temperature rises above 101.5 as measured with a thermometer, it is recommended that you give them 1,000 mg every 6-8 hours until they are temperature falls below 101.5, please not take more than 3,000 mg of acetaminophen either as a separate medication or as in ingredient in an over-the-counter cold/flu preparation within a 24-hour period  Ibuprofen  (Advil, Motrin): This is a good anti-inflammatory medication which addresses aches and pains and, to some degree, congestion in the nasal passages.  I recommend giving between 400 to 600 mg every 6-8 hours as needed.  Pseudoephedrine (Sudafed): This is a decongestant.  This medication has to be purchased from the pharmacist counter, I recommend giving 2 tablets, 60 mg, 2-3 times a day as needed to relieve runny nose and sinus drainage.  Guaifenesin (Robitussin, Mucinex): This is an expectorant.  This helps break up chest congestion and loosen up thick nasal drainage making phlegm and drainage more liquid and therefore easier to remove.  I recommend being 400 mg three times daily as needed.  Dextromethorphan (any cough medicine with the letters "DM" added to it's name such as Robitussin DM): This is a cough suppressant.   This is often recommended to be taken at nighttime to suppress cough and help children sleep.  Give dosage as directed on the bottle.   Chloraseptic Throat Spray: Spray 5 sprays into affected area every 2 hours, hold for 15 seconds and either swallow or spit it out.  This is a excellent numbing medication because it is a spray, you can put it right where you needed and so sucking on a lozenge and numbing your entire mouth.  Based on my physical exam findings and the history provided  today, I do not see any evidence of bacterial infection therefore treatment with antibiotics would be of no benefit.  Please follow-up within the next 3 to 5 days either with your primary care provider or urgent care if your symptoms do not resolve.  If you do not have a primary care provider, we will assist you in finding one.

## 2021-10-06 ENCOUNTER — Encounter: Payer: Self-pay | Admitting: Family Medicine

## 2021-10-06 ENCOUNTER — Ambulatory Visit: Payer: Federal, State, Local not specified - PPO | Admitting: Family Medicine

## 2021-10-06 VITALS — BP 127/81 | HR 74 | Ht 72.0 in | Wt 230.0 lb

## 2021-10-06 DIAGNOSIS — Z0000 Encounter for general adult medical examination without abnormal findings: Secondary | ICD-10-CM | POA: Diagnosis not present

## 2021-10-06 DIAGNOSIS — Z114 Encounter for screening for human immunodeficiency virus [HIV]: Secondary | ICD-10-CM | POA: Diagnosis not present

## 2021-10-06 DIAGNOSIS — R7889 Finding of other specified substances, not normally found in blood: Secondary | ICD-10-CM | POA: Diagnosis not present

## 2021-10-06 DIAGNOSIS — R569 Unspecified convulsions: Secondary | ICD-10-CM

## 2021-10-06 LAB — COMPREHENSIVE METABOLIC PANEL
ALT: 22 U/L (ref 0–53)
AST: 19 U/L (ref 0–37)
Albumin: 4.4 g/dL (ref 3.5–5.2)
Alkaline Phosphatase: 96 U/L (ref 39–117)
BUN: 10 mg/dL (ref 6–23)
CO2: 31 mEq/L (ref 19–32)
Calcium: 9.3 mg/dL (ref 8.4–10.5)
Chloride: 101 mEq/L (ref 96–112)
Creatinine, Ser: 1.21 mg/dL (ref 0.40–1.50)
GFR: 78.52 mL/min (ref >60.00)
Glucose, Bld: 95 mg/dL (ref 70–99)
Potassium: 3.8 mEq/L (ref 3.5–5.1)
Sodium: 138 mEq/L (ref 135–145)
Total Bilirubin: 0.3 mg/dL (ref 0.2–1.2)
Total Protein: 7.4 g/dL (ref 6.0–8.3)

## 2021-10-06 LAB — CBC
HCT: 42.2 % (ref 39.0–52.0)
Hemoglobin: 14 g/dL (ref 13.0–17.0)
MCHC: 33.3 g/dL (ref 30.0–36.0)
MCV: 87.2 fl (ref 78.0–100.0)
Platelets: 241 10*3/uL (ref 150.0–400.0)
RBC: 4.84 Mil/uL (ref 4.22–5.81)
RDW: 13.6 % (ref 11.5–15.5)
WBC: 5.2 10*3/uL (ref 4.0–10.5)

## 2021-10-06 LAB — LIPID PANEL
Cholesterol: 202 mg/dL — ABNORMAL HIGH (ref 0–200)
HDL: 34.6 mg/dL — ABNORMAL LOW (ref >39.00)
Total CHOL/HDL Ratio: 6
Triglycerides: 588 mg/dL — ABNORMAL HIGH (ref 0.0–149.0)

## 2021-10-06 LAB — HEMOGLOBIN A1C: Hgb A1c MFr Bld: 5.8 % (ref 4.6–6.5)

## 2021-10-06 LAB — TSH: TSH: 3.93 u[IU]/mL (ref 0.35–5.50)

## 2021-10-06 LAB — LDL CHOLESTEROL, DIRECT: Direct LDL: 87 mg/dL

## 2021-10-06 MED ORDER — CARBAMAZEPINE ER 400 MG PO TB12: 400.0000 mg | ORAL_TABLET | Freq: Two times a day (BID) | ORAL | 3 refills | Status: DC

## 2021-10-06 NOTE — Patient Instructions (Signed)
Thank you for choosing [facility] at [facility] for your Primary Care needs. I am excited for the opportunity to partner with you to meet your health care goals. It was a pleasure meeting you today!    Information on diet, exercise, and health maintenance recommendations are listed below. This is information to help you be sure you are on track for optimal health and monitoring.   Please look over this and let us know if you have any questions or if you have completed any of the health maintenance outside of [place] so that we can be sure your records are up to date.  ___________________________________________________________  MyChart:  For all urgent or time sensitive needs we ask that you please call the office to avoid delays. Our number is [phone]. MyChart is not constantly monitored and due to the large volume of messages a day, replies may take up to 72 business hours.  MyChart Policy: MyChart allows for you to see your visit notes, after visit summary, provider recommendations, lab and tests results, make an appointment, request refills, and contact your provider or the office for non-urgent questions or concerns. Providers are seeing patients during normal business hours and do not have built in time to review MyChart messages.  We ask that you allow a minimum of 3 business days for responses to [employer]. For this reason, please do not send urgent requests through [place]. Please call the office at [phone]. New and ongoing conditions may require a visit. We have virtual and in-person visits available for your convenience.  Complex MyChart concerns may require a visit. Your provider may request you schedule a virtual or in-person visit to ensure we are providing the best care possible. MyChart messages sent after 11:00 AM on Friday will not be received by the provider until Monday morning.    Lab and Test Results: You will receive your lab and  test results on MyChart as soon as they are completed and results have been sent by the lab or testing facility. Due to this service, you will receive your results BEFORE your provider.  I review lab and test results each morning prior to seeing patients. Some results require collaboration with other providers to ensure you are receiving the most appropriate care. For this reason, we ask that you please allow a minimum of 3-5 business days from the time that ALL results have been received for your provider to receive and review lab and test results and contact you about these.  Most lab and test result comments from the provider will be sent through [place]. Your provider may recommend changes to the plan of care, follow-up visits, repeat testing, ask questions, or request an office visit to discuss these results. You may reply directly to this message or call the office to provide information for the provider or set up an appointment. In some instances, you will be called with test results and recommendations. Please let us know if this is preferred and we will make note of this in your chart to provide this for you.    If you have not heard a response to your lab or test results in 5 business days from all results returning to [place], please call the office to let us know. We ask that you please avoid calling prior to this time unless there is an emergent concern. Due to high call volumes, this can delay the resulting process.  After Hours: For all non-emergency after hours  needs, please call the office at [phone] and select the option to reach the on-call  service. On-call services are shared between multiple [place] offices and therefore it will not be possible to speak directly with your provider. On-call providers may provide medical advice and recommendations, but are unable to provide refills for maintenance medications.  For all emergency or urgent medical needs after normal business  hours, we recommend that you seek care at the closest Urgent Care or Emergency Department to ensure appropriate treatment in a timely manner.  [facility] has a 24 hour emergency room located on the ground floor for your convenience.   Urgent Concerns During the Business Day Providers are seeing patients from 8AM to [place] with a busy schedule and are most often not able to respond to non-urgent calls until the end of the day or the next business day. If you should have URGENT concerns during the day, please call and speak to the nurse or schedule a same day appointment so that we can address your concern without delay.   Thank you, again, for choosing me as your health care partner. I appreciate your trust and look forward to learning more about you.   [facility] Olevia Bowens, DNP, FNP-C  ___________________________________________________________  Health Maintenance Recommendations Screening Testing Mammogram Every 1-2 years based on history and risk factors Starting at age 19 Pap Smear Ages 21-39 every 3 years Ages 6-65 every 5 years with HPV testing More frequent testing may be required based on results and history Colon Cancer Screening Every 1-10 years based on test performed, risk factors, and history Starting at age 43 Bone Density Screening Every 2-10 years based on history Starting at age 15 for women Recommendations for men differ based on medication usage, history, and risk factors AAA Screening One time ultrasound Men 48-65 years old who have ever smoked Lung Cancer Screening Low Dose Lung CT every 12 months Age 50-80 years with a 20 pack-year smoking history who still smoke or who have quit within the last 15 years  Screening Labs Routine  Labs: Complete Blood Count (CBC), Complete Metabolic Panel (CMP), Cholesterol (Lipid Panel) Every 6-12 months based on history and medications May be recommended more frequently based on current conditions or  previous results Hemoglobin A1c Lab Every 3-12 months based on history and previous results Starting at age 65 or earlier with diagnosis of diabetes, high cholesterol, BMI >26, and/or risk factors Frequent monitoring for patients with diabetes to ensure blood sugar control Thyroid Panel (TSH w/ T3 & T4) Every 6 months based on history, symptoms, and risk factors May be repeated more often if on medication HIV One time testing for all patients 49 and older May be repeated more frequently for patients with increased risk factors or exposure Hepatitis C One time testing for all patients 42 and older May be repeated more frequently for patients with increased risk factors or exposure Gonorrhea, Chlamydia Every 12 months for all sexually active persons 13-24 years Additional monitoring may be recommended for those who are considered high risk or who have symptoms PSA Men 69-28 years old with risk factors Additional screening may be recommended from age 21-69 based on risk factors, symptoms, and history  Vaccine Recommendations Tetanus Booster All adults every 10 years Flu Vaccine All patients 6 months and older every year COVID Vaccine All patients 12 years and older Initial dosing with booster May recommend additional booster based on age and health history HPV Vaccine 2 doses all  patients age 2-26 Dosing may be considered for patients over 26 Shingles Vaccine (Shingrix) 2 doses all adults 78 years and older Pneumonia (Pneumovax 66) All adults 79 years and older May recommend earlier dosing based on health history Pneumonia (Prevnar 52) All adults 43 years and older Dosed 1 year after Pneumovax 23 Pneumonia (Prevnar 44) All adults 65 years and older (adults 16-10 with certain conditions or risk factors) 1 dose  For those who have no received Prevnar 13 vaccine previously   Additional Screening, Testing, and Vaccinations may be recommended on an individualized basis based on  family history, health history, risk factors, and/or exposure.  __________________________________________________________  Diet Recommendations for All Patients  I recommend that all patients maintain a diet low in saturated fats, carbohydrates, and cholesterol. While this can be challenging at first, it is not impossible and small changes can make big differences.  Things to try: Decreasing the amount of soda, sweet tea, and/or juice to one or less per day and replace with water While water is always the first choice, if you do not like water you may consider adding a water additive without sugar to improve the taste other sugar free drinks Replace potatoes with a brightly colored vegetable at dinner Use healthy oils, such as canola oil or olive oil, instead of butter or hard margarine Limit your bread intake to two pieces or less a day Replace regular pasta with low carb pasta options Bake, broil, or grill foods instead of frying Monitor portion sizes  Eat smaller, more frequent meals throughout the day instead of large meals  An important thing to remember is, if you love foods that are not great for your health, you don't have to give them up completely. Instead, allow these foods to be a reward when you have done well. Allowing yourself to still have special treats every once in a while is a nice way to tell yourself thank you for working hard to keep yourself healthy.   Also remember that every day is a new day. If you have a bad day and "fall off the wagon", you can still climb right back up and keep moving along on your journey!  We have resources available to help you!  Some websites that may be helpful include: www.http://carter.biz/  Www.VeryWellFit.com _____________________________________________________________  Activity Recommendations for All Patients  I recommend that all adults get at least 20 minutes of moderate physical activity that elevates your heart rate at least 5  days out of the week.  Some examples include: Walking or jogging at a pace that allows you to carry on a conversation Cycling (stationary bike or outdoors) Water aerobics Yoga Weight lifting Dancing If physical limitations prevent you from putting stress on your joints, exercise in a pool or seated in a chair are excellent options.  Do determine your MAXIMUM heart rate for activity: YOUR AGE - 220 = MAX HeartRate   Remember! Do not push yourself too hard.  Start slowly and build up your pace, speed, weight, time in exercise, etc.  Allow your body to rest between exercise and get good sleep. You will need more water than normal when you are exerting yourself. Do not wait until you are thirsty to drink. Drink with a purpose of getting in at least 8, 8 ounce glasses of water a day plus more depending on how much you exercise and sweat.    If you begin to develop dizziness, chest pain, abdominal pain, jaw pain, shortness of breath, headache,  vision changes, lightheadedness, or other concerning symptoms, stop the activity and allow your body to rest. If your symptoms are severe, seek emergency evaluation immediately. If your symptoms are concerning, but not severe, please let us know so that we can recommend further evaluation.
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
## 2021-10-07 LAB — CARBAMAZEPINE LEVEL, TOTAL: Carbamazepine Lvl: 3.5 mg/L — ABNORMAL LOW (ref 4.0–12.0)

## 2021-10-07 LAB — HIV ANTIBODY (ROUTINE TESTING W REFLEX): HIV 1&2 Ab, 4th Generation: NONREACTIVE

## 2021-10-08 NOTE — Addendum Note (Signed)
Addended by: Caleen Jobs B on: 10/08/2021 03:28 PM ? ? Modules accepted: Orders ? ?

## 2022-01-09 NOTE — Progress Notes (Deleted)
   Established Patient Office Visit  Subjective   Patient ID: Dakota Evans, male    DOB: Mar 15, 1986  Age: 36 y.o. MRN: [id number]  No chief complaint on file.   HPI  Patient presents for 37-monthfollow-up. Reports he is doing very well overall. ***  (Pending appointment - precharting)***  Hx of Seizures: *** - Currently taking Tegretol 400 mg BID and Keppra 1500 mg BID.  - No recent seizure like activity. Last seizure was in 2017. - At last appointment, referral was made to neuro ([facility]) for him to reestablish care (he had previously been seeing them once per year) as his carbamazepine levels were not therapeutic despite reported medication compliance. Per referral note, they attempted to call him in March, but were unable to leave a message. ***    - At physical in March, triglycerides were elevated. Recommended adding Omega-3 and encouraged lifestyle modifications.  - At that time, A1c was also elevated at 5.8% - discussed lifestyle modifications.      {History (Optional):23778}  ROS    Objective:     There were no vitals taken for this visit. {Vitals History (Optional):23777}  Physical Exam   No results found for any visits on 01/10/22.  {Labs (Optional):23779}  The ASCVD Risk score (Arnett DK, et al., 2019) failed to calculate for the following reasons:   The 2019 ASCVD risk score is only valid for ages 44to 741   Assessment & Plan:   Problem List Items Addressed This Visit   None   No follow-ups on file.    TTerrilyn Saver NP

## 2022-01-10 ENCOUNTER — Telehealth: Payer: Federal, State, Local not specified - PPO | Admitting: Family Medicine

## 2022-01-10 ENCOUNTER — Telehealth: Payer: Self-pay | Admitting: *Deleted

## 2022-01-10 NOTE — Telephone Encounter (Signed)
Appt rescheduled for 01/14/22

## 2022-01-10 NOTE — Telephone Encounter (Signed)
Caller Name [facility] Phone Number [phone] Patient Name Dakota Evans Patient DOB April 13, 1986 Call Type Message Only Information Provided Reason for Call Request to Reschedule Office Appointment Initial Comment Caller states he needs to reschedule his appointment that is set for Monday Patient request to speak to RN No Disp. Time Disposition Final User 01/09/2022 3:48:07 AM General Information Provided Yes Spell, Olivia Mackie

## 2022-01-14 ENCOUNTER — Ambulatory Visit: Payer: Federal, State, Local not specified - PPO | Admitting: Family Medicine

## 2022-03-26 DIAGNOSIS — G4489 Other headache syndrome: Secondary | ICD-10-CM | POA: Diagnosis not present

## 2022-03-26 DIAGNOSIS — R5381 Other malaise: Secondary | ICD-10-CM | POA: Diagnosis not present

## 2022-03-26 DIAGNOSIS — R569 Unspecified convulsions: Secondary | ICD-10-CM | POA: Diagnosis not present

## 2022-03-26 DIAGNOSIS — R000 Tachycardia, unspecified: Secondary | ICD-10-CM | POA: Diagnosis not present

## 2022-04-01 ENCOUNTER — Ambulatory Visit: Payer: Federal, State, Local not specified - PPO | Admitting: Family

## 2022-04-01 VITALS — BP 114/70 | HR 84 | Temp 98.0°F | Resp 18 | Ht 72.0 in | Wt 225.0 lb

## 2022-04-01 DIAGNOSIS — G40909 Epilepsy, unspecified, not intractable, without status epilepticus: Secondary | ICD-10-CM | POA: Diagnosis not present

## 2022-04-01 NOTE — Patient Instructions (Signed)
[facility]
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
## 2022-04-27 ENCOUNTER — Ambulatory Visit: Payer: Federal, State, Local not specified - PPO | Admitting: Adult Health

## 2022-04-27 ENCOUNTER — Encounter: Payer: Self-pay | Admitting: Adult Health

## 2022-04-27 VITALS — BP 133/86 | HR 81 | Ht 72.0 in | Wt 227.0 lb

## 2022-04-27 DIAGNOSIS — Z5181 Encounter for therapeutic drug level monitoring: Secondary | ICD-10-CM | POA: Diagnosis not present

## 2022-04-27 DIAGNOSIS — G40109 Localization-related (focal) (partial) symptomatic epilepsy and epileptic syndromes with simple partial seizures, not intractable, without status epilepticus: Secondary | ICD-10-CM | POA: Diagnosis not present

## 2022-04-27 DIAGNOSIS — R569 Unspecified convulsions: Secondary | ICD-10-CM

## 2022-04-27 MED ORDER — LEVETIRACETAM 750 MG PO TABS: 1500.0000 mg | ORAL_TABLET | Freq: Two times a day (BID) | ORAL | 4 refills | Status: DC

## 2022-04-27 MED ORDER — CARBAMAZEPINE ER 400 MG PO TB12: 400.0000 mg | ORAL_TABLET | Freq: Two times a day (BID) | ORAL | 4 refills | Status: DC
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
## 2022-04-29 LAB — CBC WITH DIFFERENTIAL/PLATELET
Basophils Absolute: 0 10*3/uL (ref 0.0–0.2)
Basos: 0 %
EOS (ABSOLUTE): 0.1 10*3/uL (ref 0.0–0.4)
Eos: 3 %
Hematocrit: 42.6 % (ref 37.5–51.0)
Hemoglobin: 14.4 g/dL (ref 13.0–17.7)
Immature Grans (Abs): 0 10*3/uL (ref 0.0–0.1)
Immature Granulocytes: 0 %
Lymphocytes Absolute: 1.7 10*3/uL (ref 0.7–3.1)
Lymphs: 37 %
MCH: 28.6 pg (ref 26.6–33.0)
MCHC: 33.8 g/dL (ref 31.5–35.7)
MCV: 85 fL (ref 79–97)
Monocytes Absolute: 0.5 10*3/uL (ref 0.1–0.9)
Monocytes: 11 %
Neutrophils Absolute: 2.2 10*3/uL (ref 1.4–7.0)
Neutrophils: 49 %
Platelets: 235 10*3/uL (ref 150–450)
RBC: 5.03 x10E6/uL (ref 4.14–5.80)
RDW: 13.2 % (ref 11.6–15.4)
WBC: 4.5 10*3/uL (ref 3.4–10.8)

## 2022-04-29 LAB — COMPREHENSIVE METABOLIC PANEL
ALT: 29 IU/L (ref 0–44)
AST: 29 IU/L (ref 0–40)
Albumin/Globulin Ratio: 1.6 (ref 1.2–2.2)
Albumin: 4.6 g/dL (ref 4.1–5.1)
Alkaline Phosphatase: 107 IU/L (ref 44–121)
BUN/Creatinine Ratio: 9 (ref 9–20)
BUN: 10 mg/dL (ref 6–20)
Bilirubin Total: <0.2 mg/dL (ref 0.0–1.2)
CO2: 25 mmol/L (ref 20–29)
Calcium: 9.4 mg/dL (ref 8.7–10.2)
Chloride: 103 mmol/L (ref 96–106)
Creatinine, Ser: 1.06 mg/dL (ref 0.76–1.27)
Globulin, Total: 2.8 g/dL (ref 1.5–4.5)
Glucose: 90 mg/dL (ref 70–99)
Potassium: 4.1 mmol/L (ref 3.5–5.2)
Sodium: 143 mmol/L (ref 134–144)
Total Protein: 7.4 g/dL (ref 6.0–8.5)
eGFR: 94 mL/min/{1.73_m2} (ref >59)

## 2022-04-29 LAB — CARBAMAZEPINE LEVEL, TOTAL: Carbamazepine (Tegretol), S: 5.7 ug/mL (ref 4.0–12.0)

## 2022-04-29 LAB — LEVETIRACETAM LEVEL: Levetiracetam Lvl: 11.6 ug/mL (ref 10.0–40.0)

## 2022-05-05 ENCOUNTER — Encounter (HOSPITAL_COMMUNITY): Payer: Self-pay

## 2022-05-05 ENCOUNTER — Observation Stay (HOSPITAL_BASED_OUTPATIENT_CLINIC_OR_DEPARTMENT_OTHER)
Admission: EM | Admit: 2022-05-05 | Discharge: 2022-05-06 | Disposition: A | Discharge status: Home / Self Care | Payer: Federal, State, Local not specified - PPO | Attending: Student | Admitting: Student

## 2022-05-05 DIAGNOSIS — E876 Hypokalemia: Secondary | ICD-10-CM | POA: Diagnosis not present

## 2022-05-05 DIAGNOSIS — Z9049 Acquired absence of other specified parts of digestive tract: Secondary | ICD-10-CM | POA: Insufficient documentation

## 2022-05-05 DIAGNOSIS — R569 Unspecified convulsions: Secondary | ICD-10-CM

## 2022-05-05 DIAGNOSIS — I10 Essential (primary) hypertension: Secondary | ICD-10-CM | POA: Diagnosis not present

## 2022-05-05 DIAGNOSIS — Z87891 Personal history of nicotine dependence: Secondary | ICD-10-CM | POA: Diagnosis not present

## 2022-05-05 DIAGNOSIS — K922 Gastrointestinal hemorrhage, unspecified: Principal | ICD-10-CM | POA: Diagnosis present

## 2022-05-05 DIAGNOSIS — Z79899 Other long term (current) drug therapy: Secondary | ICD-10-CM | POA: Diagnosis not present

## 2022-05-05 DIAGNOSIS — K625 Hemorrhage of anus and rectum: Secondary | ICD-10-CM

## 2022-05-05 LAB — CBC
HCT: 41.3 % (ref 39.0–52.0)
Hemoglobin: 14.1 g/dL (ref 13.0–17.0)
MCH: 28.8 pg (ref 26.0–34.0)
MCHC: 34.1 g/dL (ref 30.0–36.0)
MCV: 84.3 fL (ref 80.0–100.0)
Platelets: 250 10*3/uL (ref 150–400)
RBC: 4.9 MIL/uL (ref 4.22–5.81)
RDW: 12.6 % (ref 11.5–15.5)
WBC: 5.4 10*3/uL (ref 4.0–10.5)
nRBC: 0 % (ref 0.0–0.2)

## 2022-05-05 LAB — COMPREHENSIVE METABOLIC PANEL
ALT: 26 U/L (ref 0–44)
AST: 37 U/L (ref 15–41)
Albumin: 4.3 g/dL (ref 3.5–5.0)
Alkaline Phosphatase: 87 U/L (ref 38–126)
Anion gap: 11 (ref 5–15)
BUN: 9 mg/dL (ref 6–20)
CO2: 22 mmol/L (ref 22–32)
Calcium: 8.9 mg/dL (ref 8.9–10.3)
Chloride: 105 mmol/L (ref 98–111)
Creatinine, Ser: 1.19 mg/dL (ref 0.61–1.24)
GFR, Estimated: >60 mL/min (ref >60)
Glucose, Bld: 115 mg/dL — ABNORMAL HIGH (ref 70–99)
Potassium: 2.9 mmol/L — ABNORMAL LOW (ref 3.5–5.1)
Sodium: 138 mmol/L (ref 135–145)
Total Bilirubin: 0.3 mg/dL (ref 0.3–1.2)
Total Protein: 7.6 g/dL (ref 6.5–8.1)

## 2022-05-05 LAB — HEMOGLOBIN AND HEMATOCRIT, BLOOD
HCT: 41.8 % (ref 39.0–52.0)
Hemoglobin: 13.9 g/dL (ref 13.0–17.0)

## 2022-05-05 LAB — OCCULT BLOOD X 1 CARD TO LAB, STOOL: Fecal Occult Bld: NEGATIVE

## 2022-05-05 LAB — MAGNESIUM: Magnesium: 1.9 mg/dL (ref 1.7–2.4)

## 2022-05-05 MED ORDER — SODIUM CHLORIDE 0.9 % IV SOLN: INTRAVENOUS | Status: DC

## 2022-05-05 MED ORDER — PANTOPRAZOLE SODIUM 40 MG IV SOLR
40.0000 mg | Freq: Two times a day (BID) | INTRAVENOUS | Status: DC
  Administered 2022-05-05 – 2022-05-06 (×2): 40 mg via INTRAVENOUS
  Filled 2022-05-05 (×2): qty 10

## 2022-05-05 MED ORDER — LEVETIRACETAM 500 MG PO TABS
1500.0000 mg | ORAL_TABLET | Freq: Two times a day (BID) | ORAL | Status: DC
  Administered 2022-05-05 – 2022-05-06 (×2): 1500 mg via ORAL
  Filled 2022-05-05 (×2): qty 3

## 2022-05-05 MED ORDER — ACETAMINOPHEN 650 MG RE SUPP: 650.0000 mg | Freq: Four times a day (QID) | RECTAL | Status: DC | PRN

## 2022-05-05 MED ORDER — ONDANSETRON HCL 4 MG/2ML IJ SOLN: 4.0000 mg | Freq: Four times a day (QID) | INTRAMUSCULAR | Status: DC | PRN

## 2022-05-05 MED ORDER — POTASSIUM CHLORIDE 10 MEQ/100ML IV SOLN
10.0000 meq | INTRAVENOUS | Status: AC
  Administered 2022-05-05 (×3): 10 meq via INTRAVENOUS
  Filled 2022-05-05 (×3): qty 100

## 2022-05-05 MED ORDER — SODIUM CHLORIDE 0.9 % IV SOLN: Freq: Once | INTRAVENOUS | Status: AC

## 2022-05-05 MED ORDER — CARBAMAZEPINE ER 200 MG PO TB12
400.0000 mg | ORAL_TABLET | Freq: Two times a day (BID) | ORAL | Status: DC
  Administered 2022-05-05 – 2022-05-06 (×2): 400 mg via ORAL
  Filled 2022-05-05 (×2): qty 2

## 2022-05-05 MED ORDER — POTASSIUM CHLORIDE CRYS ER 20 MEQ PO TBCR
40.0000 meq | EXTENDED_RELEASE_TABLET | Freq: Two times a day (BID) | ORAL | Status: DC
  Administered 2022-05-05 – 2022-05-06 (×2): 40 meq via ORAL
  Filled 2022-05-05 (×2): qty 2

## 2022-05-05 MED ORDER — ACETAMINOPHEN 325 MG PO TABS: 650.0000 mg | ORAL_TABLET | Freq: Four times a day (QID) | ORAL | Status: DC | PRN

## 2022-05-05 MED ORDER — ONDANSETRON HCL 4 MG PO TABS: 4.0000 mg | ORAL_TABLET | Freq: Four times a day (QID) | ORAL | Status: DC | PRN

## 2022-05-05 NOTE — ED Notes (Signed)
Report given to [facility]
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
## 2022-05-05 NOTE — Progress Notes (Signed)
Plan of Care Note for accepted transfer   Patient: Dakota Evans MRN: [id number]   [place]: 05/05/2022  Facility requesting transfer: [facility] Requesting Provider: Dr. Billy Fischer Reason for transfer: GIB, hypokalemia Facility course: 36 yo M w/ PMHx of seizures. Presenting w/ rectal bleeding starting last night. Had 6 episodes of dark stools and dark red blood W/u revealed normal vitals, K+ 2.9, Hgb 14.1. EDP to call [facility] He was started on IV K+.   Plan of care: The patient is accepted for admission to Telemetry unit, at [facility]. While holding at [facility], medical decision making responsibilities remain with the [place] [facility]. Upon arrival to [facility], [facility] will assume care. Thank you.   Author: Jonnie Finner, DO 05/05/2022  Check [email or web address] for on-call coverage.  Nursing staff, Please call [place] number on Amion as soon as patient's arrival, so appropriate admitting provider can evaluate the pt.

## 2022-05-05 NOTE — ED Triage Notes (Signed)
Pt reports blood in stool that started yesterday Reports dark red blood when wipes. Reports hx colostomy bag 10 years ago.

## 2022-05-06 ENCOUNTER — Other Ambulatory Visit: Payer: Self-pay

## 2022-05-06 ENCOUNTER — Telehealth: Payer: Self-pay

## 2022-05-06 ENCOUNTER — Encounter (HOSPITAL_COMMUNITY): Payer: Self-pay | Admitting: Internal Medicine

## 2022-05-06 DIAGNOSIS — K625 Hemorrhage of anus and rectum: Secondary | ICD-10-CM | POA: Diagnosis not present

## 2022-05-06 DIAGNOSIS — E876 Hypokalemia: Secondary | ICD-10-CM

## 2022-05-06 DIAGNOSIS — K922 Gastrointestinal hemorrhage, unspecified: Secondary | ICD-10-CM | POA: Diagnosis not present

## 2022-05-06 DIAGNOSIS — R569 Unspecified convulsions: Secondary | ICD-10-CM | POA: Diagnosis not present

## 2022-05-06 LAB — COMPREHENSIVE METABOLIC PANEL
ALT: 23 U/L (ref 0–44)
AST: 26 U/L (ref 15–41)
Albumin: 3.7 g/dL (ref 3.5–5.0)
Alkaline Phosphatase: 73 U/L (ref 38–126)
Anion gap: 6 (ref 5–15)
BUN: 8 mg/dL (ref 6–20)
CO2: 24 mmol/L (ref 22–32)
Calcium: 8.8 mg/dL — ABNORMAL LOW (ref 8.9–10.3)
Chloride: 109 mmol/L (ref 98–111)
Creatinine, Ser: 1.08 mg/dL (ref 0.61–1.24)
GFR, Estimated: >60 mL/min (ref >60)
Glucose, Bld: 105 mg/dL — ABNORMAL HIGH (ref 70–99)
Potassium: 3.6 mmol/L (ref 3.5–5.1)
Sodium: 139 mmol/L (ref 135–145)
Total Bilirubin: 0.6 mg/dL (ref 0.3–1.2)
Total Protein: 6.5 g/dL (ref 6.5–8.1)

## 2022-05-06 LAB — CBC
HCT: 41.6 % (ref 39.0–52.0)
Hemoglobin: 13.6 g/dL (ref 13.0–17.0)
MCH: 28.6 pg (ref 26.0–34.0)
MCHC: 32.7 g/dL (ref 30.0–36.0)
MCV: 87.4 fL (ref 80.0–100.0)
Platelets: 211 10*3/uL (ref 150–400)
RBC: 4.76 MIL/uL (ref 4.22–5.81)
RDW: 12.6 % (ref 11.5–15.5)
WBC: 4.8 10*3/uL (ref 4.0–10.5)
nRBC: 0 % (ref 0.0–0.2)

## 2022-05-06 NOTE — TOC Progression Note (Signed)
Transition of Care [facility]) - Progression Note    Patient Details  Name: Dakota Evans MRN: [id number] Date of Birth: Dec 17, 1985  Transition of Care [facility]) CM/SW Contact  Servando Snare, [place] Phone Number: 05/06/2022, 10:15 AM  Clinical Narrative:    Transition of Care (TOC) Screening Note   Patient Details  Name: Dakota Evans Date of Birth: December 05, 1985   Transition of Care [facility]) CM/SW Contact:    Servando Snare, LCSW Phone Number: 05/06/2022, 10:16 AM    Transition of Care Department [facility]) has reviewed patient and no TOC needs have been identified at this time. We will continue to monitor patient advancement through interdisciplinary progression rounds. If new patient transition needs arise, please place a TOC consult.           Expected Discharge Plan and Services                                                 Social Determinants of Health (SDOH) Interventions    Readmission Risk Interventions     No data to display

## 2022-05-06 NOTE — Discharge Summary (Signed)
Physician Discharge Summary  [facility] GYK:[id number] DOB: [birth year] DOA: 05/05/2022  PCP: Terrilyn Saver, NP  Admit date: 05/05/2022 Discharge date: 05/06/2022 Admitted From: Home Disposition: Home Recommendations for Outpatient Follow-up:  Follow up with PCP in 1 to 2 weeks GI to arrange outpatient follow-up Check BMP and CBC in 1 to 2 weeks Please follow up on the following pending results: None  Home Health: Not indicated Equipment/Devices: Not indicated  Discharge Condition: Stable CODE STATUS: Full code  Follow-up Information     Terrilyn Saver, NP. Schedule an appointment as soon as possible for a visit in 1 week(s).   Specialties: Family Medicine, Emergency Medicine Contact information: [place] [address] [phone]                 Hospital course 36 year old M with PMH of seizure and multiple rib note abdominal surgeries including bowel resection, ileostomy and ileostomy closure presenting with hematochezia for 1 day.-No other GI symptoms.  No constipation.  Not on anticoagulation, antiplatelet or NSAID.  No trauma or fall or injury.  In ED, hemodynamically stable.  See individual problem list below for more.  CBC and CMP without significant finding other than hypokalemia to 2.9.  For Cal Hemoccult negative.    The next day, patient had a bowel movement without blood.  Hemoglobin remained stable.  He was evaluated by GI who recommended outpatient follow-up.  Hypokalemia resolved.  Problems addressed during this hospitalization Principal Problem:   GIB (gastrointestinal bleeding) Active Problems:   Seizure ([facility])   Hypokalemia   Rectal bleeding              Vital signs Vitals:   05/05/22 2054 05/06/22 0044 05/06/22 0458 05/06/22 0854  BP: 118/70 110/66 (!) 110/59 134/82  Pulse: 77 73 71 70  Temp: 98.3 F (36.8 C) 98.2 F (36.8 C) 97.9 F (36.6 C) 97.9 F (36.6 C)  Resp: '18 18 16 17  '$ Height:       Weight:      SpO2: 99% 100% 99% 99%  TempSrc: Oral Oral Oral Oral  BMI (Calculated):         Discharge exam  GENERAL: No apparent distress.  Nontoxic. HEENT: MMM.  Vision and hearing grossly intact.  NECK: Supple.  No apparent JVD.  RESP:  No IWOB.  Fair aeration bilaterally. CVS:  RRR. Heart sounds normal.  ABD/GI/GU: BS+. Abd soft, NTND.  Old scars from previous surgeries MSK/EXT:  Moves extremities. No apparent deformity. No edema.  SKIN: no apparent skin lesion or wound NEURO: Awake and alert. Oriented appropriately.  No apparent focal neuro deficit. PSYCH: Calm. Normal affect.   Discharge Instructions Discharge Instructions     Diet general   Complete by: As directed    Soft diet   Discharge instructions   Complete by: As directed    It has been a pleasure taking care of you!  You were hospitalized due to rectal bleed that seems to have subsided.  Your hemoglobin remained stable.  Gastroenterologist office will arrange outpatient follow-up for further evaluation.  We recommend soft diet for the next 2 to 3 days.   Take care,   Increase activity slowly   Complete by: As directed       Allergies as of 05/06/2022       Reactions   Zolpidem Tartrate Other (See Comments)   Caused patient to sleep so hard he would not get up to urinate Pt states "  I sleep too hard."         Medication List     TAKE these medications    carbamazepine 400 MG 12 hr tablet Commonly known as: TEGRETOL XR Take 1 tablet (400 mg total) by mouth 2 (two) times daily. What changed: when to take this   levETIRAcetam 750 MG tablet Commonly known as: KEPPRA Take 2 tablets (1,500 mg total) by mouth 2 (two) times daily. What changed: when to take this        Consultations: Gastroenterology  Procedures/Studies:   No results found.     The results of significant diagnostics from this hospitalization (including imaging, microbiology, ancillary and laboratory) are listed below  for reference.     Microbiology: No results found for this or any previous visit (from the past 240 hour(s)).   Labs:  CBC: Recent Labs  Lab 05/05/22 1217 05/05/22 1837 05/06/22 0042  WBC 5.4  --  4.8  HGB 14.1 13.9 13.6  HCT 41.3 41.8 41.6  MCV 84.3  --  87.4  PLT 250  --  211   BMP &GFR Recent Labs  Lab 05/05/22 1217 05/05/22 1837 05/06/22 0042  NA 138  --  139  K 2.9*  --  3.6  CL 105  --  109  CO2 22  --  24  GLUCOSE 115*  --  105*  BUN 9  --  8  CREATININE 1.19  --  1.08  CALCIUM 8.9  --  8.8*  MG  --  1.9  --    Estimated Creatinine Clearance: 119.1 mL/min (by C-G formula based on SCr of 1.08 mg/dL). Liver & Pancreas: Recent Labs  Lab 05/05/22 1217 05/06/22 0042  AST 37 26  ALT 26 23  ALKPHOS 87 73  BILITOT 0.3 0.6  PROT 7.6 6.5  ALBUMIN 4.3 3.7   No results for input(s): "LIPASE", "AMYLASE" in the last 168 hours. No results for input(s): "AMMONIA" in the last 168 hours. Diabetic: No results for input(s): "HGBA1C" in the last 72 hours. No results for input(s): "GLUCAP" in the last 168 hours. Cardiac Enzymes: No results for input(s): "CKTOTAL", "CKMB", "CKMBINDEX", "TROPONINI" in the last 168 hours. No results for input(s): "PROBNP" in the last 8760 hours. Coagulation Profile: No results for input(s): "INR", "PROTIME" in the last 168 hours. Thyroid Function Tests: No results for input(s): "TSH", "T4TOTAL", "FREET4", "T3FREE", "THYROIDAB" in the last 72 hours. Lipid Profile: No results for input(s): "CHOL", "HDL", "LDLCALC", "TRIG", "CHOLHDL", "LDLDIRECT" in the last 72 hours. Anemia Panel: No results for input(s): "VITAMINB12", "FOLATE", "FERRITIN", "TIBC", "IRON", "RETICCTPCT" in the last 72 hours. Urine analysis:    Component Value Date/Time   COLORURINE AMBER (A) 03/28/2017 2233   APPEARANCEUR CLEAR 03/28/2017 2233   LABSPEC 1.036 (H) 03/28/2017 2233   PHURINE 5.5 03/28/2017 2233   GLUCOSEU NEGATIVE 03/28/2017 2233   HGBUR SMALL (A)  03/28/2017 2233   BILIRUBINUR SMALL (A) 03/28/2017 2233   BILIRUBINUR neg 11/25/2016 1417   KETONESUR 15 (A) 03/28/2017 2233   PROTEINUR 30 (A) 03/28/2017 2233   UROBILINOGEN 0.2 11/25/2016 1417   UROBILINOGEN 0.2 04/21/2014 1753   NITRITE NEGATIVE 03/28/2017 2233   LEUKOCYTESUR NEGATIVE 03/28/2017 2233   Sepsis Labs: Invalid input(s): "PROCALCITONIN", "LACTICIDVEN"   SIGNED:  Mercy Riding, MD  [facility] 05/06/2022, 4:25 PM

## 2022-05-06 NOTE — Telephone Encounter (Signed)
-----   Message from Willia Craze, NP sent at 05/06/2022 10:50 AM EDT ----- [name],   Will you please schedule this patient for an outpatient colonoscopy with Dr. Loletha Carrow to be done in the next few weeks at the [facility]. Diagnosis is rectal bleeding. He is hospitalized now so you can call him Monday or so with a date / time.  Thanks Pg

## 2022-05-06 NOTE — Progress Notes (Signed)
Patient discharge instructions given verbal and written. All questions answered. Patient in stable condition at time of discharge. All personal belongings with patient at time of d/c.
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
## 2022-05-09 ENCOUNTER — Telehealth: Payer: Self-pay

## 2022-05-09 NOTE — Telephone Encounter (Signed)
Dr. Loletha Carrow, [facility] was referred for a direct colonoscopy due to rectal bleeding. In preparing his chart I saw that he has a documented hx of seizures. He had a neurology appointment on 04/27/22 and states that he had a seizure in Aug. 23. Prior to that seizure he had one on 12/24/21. Per [place] guidelines do you want this patient to have an OV or a hospital procedure? His PV appointment is tomorrow 05/10/22 his colonoscopy is scheduled for 10-12/23. Please advise. Thanks!   Riki Sheer, LPN ( PV )

## 2022-05-09 NOTE — Telephone Encounter (Signed)
Called and spoke with patient. He has been scheduled for a PV appt on Tuesday, 05/10/22 at 1 pm. Patient has been scheduled for a colonoscopy in the [place] with Dr. Loletha Carrow on Thursday, 05/19/22 at 3:30 pm. Patient is aware that he will need to arrive at 2:30 pm with a care partner. Patient is aware that I will send the appt information to his MyChart. Pt verbalized understanding and had no concerns at the end of the call.

## 2022-05-10 ENCOUNTER — Other Ambulatory Visit: Payer: Self-pay

## 2022-05-10 ENCOUNTER — Ambulatory Visit (AMBULATORY_SURGERY_CENTER): Payer: Self-pay | Admitting: *Deleted

## 2022-05-10 VITALS — Ht 72.0 in | Wt 225.0 lb

## 2022-05-10 DIAGNOSIS — K625 Hemorrhage of anus and rectum: Secondary | ICD-10-CM

## 2022-05-10 MED ORDER — PEG 3350-KCL-NA BICARB-NACL 420 G PO SOLR: 4000.0000 mL | Freq: Once | ORAL | 0 refills | Status: AC

## 2022-05-10 NOTE — Telephone Encounter (Signed)
Thank you for the note.  Please move the procedure to the end of my [place] endoscopy outpatient block that morning, 05/19/22  - H. Danis

## 2022-05-10 NOTE — Telephone Encounter (Signed)
[place] colonoscopy has been cancelled.  Patient's colonoscopy has been moved to [facility] on Thursday, 05/19/22 at 11:30 am. Pt would need to arrive by 10 am with a care partner. Pt has PV appt today.

## 2022-05-10 NOTE — Progress Notes (Signed)
No egg or soy allergy known to patient  No issues known to pt with past sedation with any surgeries or procedures Patient denies ever being told they had issues or difficulty with intubation  No FH of Malignant Hyperthermia Pt is not on diet pills Pt is not on  home 02  Pt is not on blood thinners  Pt denies issues with constipation  No A fib or A flutter Have any cardiac testing pending--no Pt instructed to use Singlecare.com or GoodRx for a price reduction on prep   Pt has had recent seizures.  Per Dr. Loletha Carrow will have procedure at [facility] 05/19/22.

## 2022-05-16 ENCOUNTER — Ambulatory Visit: Payer: Federal, State, Local not specified - PPO | Admitting: Family

## 2022-05-16 VITALS — BP 123/69 | HR 76 | Temp 97.9°F | Resp 16 | Wt 227.0 lb

## 2022-05-16 DIAGNOSIS — Z9889 Other specified postprocedural states: Secondary | ICD-10-CM | POA: Diagnosis not present

## 2022-05-16 DIAGNOSIS — Z302 Encounter for sterilization: Secondary | ICD-10-CM | POA: Insufficient documentation

## 2022-05-16 DIAGNOSIS — K922 Gastrointestinal hemorrhage, unspecified: Secondary | ICD-10-CM

## 2022-05-16 LAB — BASIC METABOLIC PANEL
BUN: 6 mg/dL (ref 6–23)
CO2: 32 mEq/L (ref 19–32)
Calcium: 9.4 mg/dL (ref 8.4–10.5)
Chloride: 102 mEq/L (ref 96–112)
Creatinine, Ser: 1.09 mg/dL (ref 0.40–1.50)
GFR: 88.63 mL/min (ref >60.00)
Glucose, Bld: 91 mg/dL (ref 70–99)
Potassium: 4.3 mEq/L (ref 3.5–5.1)
Sodium: 142 mEq/L (ref 135–145)

## 2022-05-16 LAB — CBC WITH DIFFERENTIAL/PLATELET
Basophils Absolute: 0 10*3/uL (ref 0.0–0.1)
Basophils Relative: 0.5 % (ref 0.0–3.0)
Eosinophils Absolute: 0.1 10*3/uL (ref 0.0–0.7)
Eosinophils Relative: 1.7 % (ref 0.0–5.0)
HCT: 41.5 % (ref 39.0–52.0)
Hemoglobin: 13.8 g/dL (ref 13.0–17.0)
Lymphocytes Relative: 42.3 % (ref 12.0–46.0)
Lymphs Abs: 1.9 10*3/uL (ref 0.7–4.0)
MCHC: 33.2 g/dL (ref 30.0–36.0)
MCV: 86.8 fl (ref 78.0–100.0)
Monocytes Absolute: 0.5 10*3/uL (ref 0.1–1.0)
Monocytes Relative: 11.6 % (ref 3.0–12.0)
Neutro Abs: 2 10*3/uL (ref 1.4–7.7)
Neutrophils Relative %: 43.9 % (ref 43.0–77.0)
Platelets: 234 10*3/uL (ref 150.0–400.0)
RBC: 4.78 Mil/uL (ref 4.22–5.81)
RDW: 13.3 % (ref 11.5–15.5)
WBC: 4.5 10*3/uL (ref 4.0–10.5)
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
## 2022-05-16 NOTE — Assessment & Plan Note (Signed)
Will refer to urology for consultation.
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
## 2022-05-16 NOTE — Assessment & Plan Note (Signed)
Clinically resolved. Pt is advised to keep upcoming appointment with GI for colonoscopy. Labs as below.

## 2022-05-19 ENCOUNTER — Encounter (HOSPITAL_COMMUNITY)
Admission: RE | Disposition: A | Discharge status: Home / Self Care | Payer: Self-pay | Source: Home / Self Care | Attending: Gastroenterology

## 2022-05-19 ENCOUNTER — Ambulatory Visit (HOSPITAL_COMMUNITY): Payer: Federal, State, Local not specified - PPO | Admitting: Certified Registered"

## 2022-05-19 ENCOUNTER — Other Ambulatory Visit: Payer: Self-pay

## 2022-05-19 ENCOUNTER — Ambulatory Visit (HOSPITAL_COMMUNITY)
Admission: RE | Admit: 2022-05-19 | Discharge: 2022-05-19 | Disposition: A | Discharge status: Home / Self Care | Payer: Federal, State, Local not specified - PPO | Attending: Gastroenterology | Admitting: Gastroenterology

## 2022-05-19 ENCOUNTER — Encounter: Payer: Federal, State, Local not specified - PPO | Admitting: Gastroenterology

## 2022-05-19 ENCOUNTER — Encounter (HOSPITAL_COMMUNITY): Payer: Self-pay | Admitting: Gastroenterology

## 2022-05-19 DIAGNOSIS — K625 Hemorrhage of anus and rectum: Secondary | ICD-10-CM

## 2022-05-19 DIAGNOSIS — K921 Melena: Secondary | ICD-10-CM | POA: Diagnosis not present

## 2022-05-19 DIAGNOSIS — Z980 Intestinal bypass and anastomosis status: Secondary | ICD-10-CM | POA: Insufficient documentation

## 2022-05-19 DIAGNOSIS — K648 Other hemorrhoids: Secondary | ICD-10-CM | POA: Insufficient documentation

## 2022-05-19 DIAGNOSIS — Z87891 Personal history of nicotine dependence: Secondary | ICD-10-CM | POA: Diagnosis not present

## 2022-05-19 DIAGNOSIS — I10 Essential (primary) hypertension: Secondary | ICD-10-CM | POA: Diagnosis not present

## 2022-05-19 HISTORY — PX: COLONOSCOPY WITH PROPOFOL: SHX5780

## 2022-05-19 SURGERY — COLONOSCOPY WITH PROPOFOL
Anesthesia: Monitor Anesthesia Care

## 2022-05-19 MED ORDER — LACTATED RINGERS IV SOLN: INTRAVENOUS | Status: DC | PRN

## 2022-05-19 MED ORDER — PROPOFOL 500 MG/50ML IV EMUL
INTRAVENOUS | Status: DC | PRN
  Administered 2022-05-19: 250 ug/kg/min via INTRAVENOUS

## 2022-05-19 MED ORDER — PROPOFOL 10 MG/ML IV BOLUS
INTRAVENOUS | Status: DC | PRN
  Administered 2022-05-19: 100 mg via INTRAVENOUS

## 2022-05-19 MED ORDER — SODIUM CHLORIDE 0.9 % IV SOLN: INTRAVENOUS | Status: DC

## 2022-05-19 MED ORDER — LIDOCAINE 2% (20 MG/ML) 5 ML SYRINGE
INTRAMUSCULAR | Status: DC | PRN
  Administered 2022-05-19: 100 mg via INTRAVENOUS

## 2022-05-19 SURGICAL SUPPLY — 22 items

## 2022-05-19 NOTE — Anesthesia Procedure Notes (Signed)
Procedure Name: MAC Date/Time: 05/19/2022 11:59 AM  Performed by: Reeves Dam, CRNAPre-anesthesia Checklist: Patient identified, Emergency Drugs available, Suction available and Patient being monitored Patient Re-evaluated:Patient Re-evaluated prior to induction Oxygen Delivery Method: Nasal cannula

## 2022-05-19 NOTE — Anesthesia Preprocedure Evaluation (Addendum)
Anesthesia Evaluation  Patient identified by MRN, date of birth, ID band Patient awake    Reviewed: Allergy & Precautions, NPO status , Patient's Chart, lab work & pertinent test results  Airway Mallampati: I       Dental no notable dental hx.    Pulmonary Patient abstained from smoking., former smoker,    Pulmonary exam normal        Cardiovascular hypertension, Normal cardiovascular exam     Neuro/Psych  Headaches, Seizures -, Well Controlled,  PSYCHIATRIC DISORDERS Depression    GI/Hepatic Neg liver ROS,   Endo/Other  negative endocrine ROS  Renal/GU negative Renal ROS  negative genitourinary   Musculoskeletal negative musculoskeletal ROS (+)   Abdominal Normal abdominal exam  (+)   Peds  Hematology negative hematology ROS (+)   Anesthesia Other Findings   Reproductive/Obstetrics                            Anesthesia Physical Anesthesia Plan  ASA: 2  Anesthesia Plan: MAC   Post-op Pain Management:    Induction:   PONV Risk Score and Plan: 1 and Treatment may vary due to age or medical condition  Airway Management Planned: Natural Airway, Nasal Cannula and Simple Face Mask  Additional Equipment: None  Intra-op Plan:   Post-operative Plan:   Informed Consent: I have reviewed the patients History and Physical, chart, labs and discussed the procedure including the risks, benefits and alternatives for the proposed anesthesia with the patient or authorized representative who has indicated his/her understanding and acceptance.       Plan Discussed with: CRNA  Anesthesia Plan Comments:         Anesthesia Quick Evaluation

## 2022-05-19 NOTE — Anesthesia Postprocedure Evaluation (Signed)
Anesthesia Post Note  Patient: Designer, television/film set) Performed: COLONOSCOPY WITH PROPOFOL     Patient location during evaluation: Endoscopy Anesthesia Type: MAC Level of consciousness: awake Pain management: pain level controlled Vital Signs Assessment: post-procedure vital signs reviewed and stable Respiratory status: spontaneous breathing Cardiovascular status: stable Postop Assessment: no apparent nausea or vomiting Anesthetic complications: no   No notable events documented.  Last Vitals:  Vitals:   05/19/22 1225 05/19/22 1240  BP: (!) 119/96 116/87  Pulse: 75 71  Resp: 18 16  Temp: 36.6 C 36.7 C  SpO2: 99% 95%    Last Pain:  Vitals:   05/19/22 1225  TempSrc:   PainSc: 0-No pain                 Huston Foley

## 2022-05-19 NOTE — Interval H&P Note (Signed)
History and Physical Interval Note:  05/19/2022 11:54 AM  [employer]  has presented today for surgery, with the diagnosis of rectal bleeding.  The various methods of treatment have been discussed with the patient and family. After consideration of risks, benefits and other options for treatment, the patient has consented to  Procedure(s): COLONOSCOPY WITH PROPOFOL (N/A) as a surgical intervention.  The patient's history has been reviewed, patient examined, no change in status, stable for surgery.  I have reviewed the patient's chart and labs.  Questions were answered to the patient's satisfaction.     Nelida Meuse III
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
## 2022-05-19 NOTE — Transfer of Care (Signed)
Immediate Anesthesia Transfer of Care Note  Patient: [employer]  Procedure(s) Performed: COLONOSCOPY WITH PROPOFOL  Patient Location: PACU  Anesthesia Type:MAC  Level of Consciousness: awake and alert   Airway & Oxygen Therapy: Patient Spontanous Breathing  Post-op Assessment: Report given to RN and Post -op Vital signs reviewed and stable  Post vital signs: Reviewed and stable  Last Vitals:  Vitals Value Taken Time  BP 119/96 05/19/22 1225  Temp    Pulse 74 05/19/22 1227  Resp 16 05/19/22 1227  SpO2 99 % 05/19/22 1227  Vitals shown include unvalidated device data.  Last Pain:  Vitals:   05/19/22 1019  TempSrc: Temporal  PainSc: 0-No pain         Complications: No notable events documented.
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
## 2022-05-24 ENCOUNTER — Encounter (HOSPITAL_COMMUNITY): Payer: Self-pay | Admitting: Gastroenterology

## 2022-06-15 ENCOUNTER — Ambulatory Visit: Payer: Federal, State, Local not specified - PPO | Admitting: Adult Health

## 2022-10-26 ENCOUNTER — Encounter: Payer: Self-pay | Admitting: Adult Health

## 2022-10-26 ENCOUNTER — Ambulatory Visit: Payer: Federal, State, Local not specified - PPO | Admitting: Adult Health

## 2022-12-11 ENCOUNTER — Other Ambulatory Visit: Payer: Self-pay | Admitting: Family Medicine

## 2022-12-11 DIAGNOSIS — R569 Unspecified convulsions: Secondary | ICD-10-CM

## 2022-12-14 ENCOUNTER — Encounter: Payer: Self-pay | Admitting: Adult Health

## 2022-12-14 ENCOUNTER — Ambulatory Visit: Payer: Federal, State, Local not specified - PPO | Admitting: Adult Health

## 2022-12-14 VITALS — BP 120/76 | HR 72 | Ht 72.0 in | Wt 235.8 lb

## 2022-12-14 DIAGNOSIS — Z5181 Encounter for therapeutic drug level monitoring: Secondary | ICD-10-CM | POA: Diagnosis not present

## 2022-12-14 DIAGNOSIS — R569 Unspecified convulsions: Secondary | ICD-10-CM | POA: Diagnosis not present

## 2022-12-14 NOTE — Patient Instructions (Signed)
  Continue carbamazepine and Keppra Blood work today If you have any seizure events please let us know.
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
## 2022-12-15 LAB — COMPREHENSIVE METABOLIC PANEL
ALT: 27 IU/L (ref 0–44)
AST: 23 IU/L (ref 0–40)
Albumin/Globulin Ratio: 1.7 (ref 1.2–2.2)
Albumin: 4.5 g/dL (ref 4.1–5.1)
Alkaline Phosphatase: 103 IU/L (ref 44–121)
BUN/Creatinine Ratio: 8 — ABNORMAL LOW (ref 9–20)
BUN: 10 mg/dL (ref 6–20)
Bilirubin Total: <0.2 mg/dL (ref 0.0–1.2)
CO2: 24 mmol/L (ref 20–29)
Calcium: 9.6 mg/dL (ref 8.7–10.2)
Chloride: 102 mmol/L (ref 96–106)
Creatinine, Ser: 1.21 mg/dL (ref 0.76–1.27)
Globulin, Total: 2.7 g/dL (ref 1.5–4.5)
Glucose: 78 mg/dL (ref 70–99)
Potassium: 4.1 mmol/L (ref 3.5–5.2)
Sodium: 142 mmol/L (ref 134–144)
Total Protein: 7.2 g/dL (ref 6.0–8.5)
eGFR: 81 mL/min/{1.73_m2} (ref >59)

## 2022-12-15 LAB — CBC WITH DIFFERENTIAL/PLATELET
Basophils Absolute: 0 10*3/uL (ref 0.0–0.2)
Basos: 1 %
EOS (ABSOLUTE): 0.1 10*3/uL (ref 0.0–0.4)
Eos: 3 %
Hematocrit: 43.3 % (ref 37.5–51.0)
Hemoglobin: 14.6 g/dL (ref 13.0–17.7)
Immature Grans (Abs): 0 10*3/uL (ref 0.0–0.1)
Immature Granulocytes: 0 %
Lymphocytes Absolute: 1.7 10*3/uL (ref 0.7–3.1)
Lymphs: 35 %
MCH: 29.3 pg (ref 26.6–33.0)
MCHC: 33.7 g/dL (ref 31.5–35.7)
MCV: 87 fL (ref 79–97)
Monocytes Absolute: 0.7 10*3/uL (ref 0.1–0.9)
Monocytes: 13 %
Neutrophils Absolute: 2.4 10*3/uL (ref 1.4–7.0)
Neutrophils: 48 %
Platelets: 245 10*3/uL (ref 150–450)
RBC: 4.99 x10E6/uL (ref 4.14–5.80)
RDW: 13.7 % (ref 11.6–15.4)
WBC: 4.9 10*3/uL (ref 3.4–10.8)

## 2022-12-15 LAB — CARBAMAZEPINE LEVEL, TOTAL: Carbamazepine (Tegretol), S: 5.7 ug/mL (ref 4.0–12.0)

## 2022-12-15 LAB — LEVETIRACETAM LEVEL: Levetiracetam Lvl: 30.9 ug/mL (ref 10.0–40.0)

## 2022-12-20 ENCOUNTER — Encounter: Payer: Self-pay | Admitting: Adult Health

## 2022-12-26 DIAGNOSIS — Z0289 Encounter for other administrative examinations: Secondary | ICD-10-CM

## 2023-01-04 ENCOUNTER — Telehealth: Payer: Self-pay | Admitting: *Deleted

## 2023-01-04 NOTE — Telephone Encounter (Signed)
Received at desk, FMLA form for pt.  I spoke to pt.  He has seizures, high stress environment [employer]). Last seizure was in 04/2022.  Had episode where he had to sit down.rest, but no seizures.

## 2023-01-16 NOTE — Telephone Encounter (Signed)
signed

## 2023-01-16 NOTE — Telephone Encounter (Signed)
Form completed after speaking to pt and he is ok for the 1 day every 2 months (intermittent).

## 2023-01-16 NOTE — Telephone Encounter (Signed)
To Medical Records per [facility].

## 2023-02-08 ENCOUNTER — Encounter: Payer: Self-pay | Admitting: Adult Health

## 2023-05-09 ENCOUNTER — Other Ambulatory Visit: Payer: Self-pay | Admitting: Adult Health

## 2023-05-09 DIAGNOSIS — R569 Unspecified convulsions: Secondary | ICD-10-CM

## 2023-05-15 ENCOUNTER — Other Ambulatory Visit: Payer: Self-pay | Admitting: Adult Health

## 2023-05-15 ENCOUNTER — Encounter: Payer: Self-pay | Admitting: Adult Health

## 2023-05-15 NOTE — Telephone Encounter (Signed)
Refill request was sent this afternoon to pharmacy

## 2023-05-15 NOTE — Telephone Encounter (Signed)
Pt was called and informed that his medication has been called in. Pt verbalized appreciation.

## 2023-05-23 ENCOUNTER — Ambulatory Visit: Payer: Federal, State, Local not specified - PPO | Admitting: Adult Health

## 2023-05-26 ENCOUNTER — Encounter: Payer: Self-pay | Admitting: Family Medicine

## 2023-05-26 ENCOUNTER — Ambulatory Visit: Payer: Federal, State, Local not specified - PPO | Admitting: Family Medicine

## 2023-05-26 VITALS — BP 122/80 | HR 75 | Temp 97.8°F | Resp 18 | Ht 72.0 in | Wt 227.0 lb

## 2023-05-26 DIAGNOSIS — F439 Reaction to severe stress, unspecified: Secondary | ICD-10-CM | POA: Diagnosis not present

## 2023-05-26 MED ORDER — HYDROXYZINE HCL 10 MG PO TABS: 10.0000 mg | ORAL_TABLET | Freq: Three times a day (TID) | ORAL | 3 refills | Status: DC | PRN

## 2023-05-26 MED ORDER — HYDROXYZINE HCL 10 MG PO TABS: 10.0000 mg | ORAL_TABLET | Freq: Three times a day (TID) | ORAL | 3 refills | Status: AC | PRN
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
## 2023-06-02 ENCOUNTER — Encounter: Payer: Self-pay | Admitting: Family Medicine

## 2023-06-02 NOTE — Telephone Encounter (Signed)
Hold for [employer] -  I know patient had paperwork when he came to appointment. Okay to send all information?

## 2023-06-21 ENCOUNTER — Encounter: Payer: Self-pay | Admitting: Adult Health

## 2023-06-21 DIAGNOSIS — Z5181 Encounter for therapeutic drug level monitoring: Secondary | ICD-10-CM

## 2023-06-21 DIAGNOSIS — R569 Unspecified convulsions: Secondary | ICD-10-CM
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
## 2023-06-22 NOTE — Telephone Encounter (Signed)
Dr. Teresa Coombs,   This is a previous patient of Dr. Clarisa Kindred. Do you mind reviewing his message and letting me know if you think this is seizures or maybe anxiety. He is on Keppra and carbamazepine.  POD 4- do you mind letting the patient know that I am discussing with Dr. Teresa Coombs and will be in touch.

## 2023-06-26 ENCOUNTER — Ambulatory Visit: Payer: Federal, State, Local not specified - PPO | Admitting: Family Medicine

## 2023-06-26 ENCOUNTER — Encounter: Payer: Self-pay | Admitting: Family Medicine

## 2023-06-26 VITALS — BP 147/92 | HR 81 | Ht 72.0 in | Wt 232.0 lb

## 2023-06-26 DIAGNOSIS — F419 Anxiety disorder, unspecified: Secondary | ICD-10-CM

## 2023-06-26 DIAGNOSIS — F32A Depression, unspecified: Secondary | ICD-10-CM
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
## 2023-06-26 NOTE — Telephone Encounter (Signed)
I called patient this morning. He states in the past he would have this patchy numbness and just feeling weird all over and then seizure would come- would lose consciousness and tremoring all over. He states these events feel similar but the seizure does not come. Please call patient and let him know that I spoke to Dr. Teresa Coombs. He recommends that I increase Carbamazepine XR to 400 mg in the AM and 800 mg in the PM. Recheck CMP and drug level when I see him next month. Also recommends that we recheck EEG. If amendable- ok to order.

## 2023-06-27 MED ORDER — CARBAMAZEPINE ER 400 MG PO TB12: ORAL_TABLET | ORAL | 1 refills | Status: DC

## 2023-06-27 NOTE — Telephone Encounter (Signed)
I called the patient and discussed the message below from  [facility] NP regarding her conversation with Dr. Teresa Coombs.  The patient agrees to the plan of care. We will order EEG.  He will increase carbamazepine XR to 400 mg in the a.m. and 800 mg in the p.m.  Patient is scheduled for an appt tomorrow.

## 2023-06-27 NOTE — Telephone Encounter (Signed)
EEG and tegretol xr order changed per v.o. Megan NP.

## 2023-06-28 ENCOUNTER — Encounter: Payer: Self-pay | Admitting: Adult Health

## 2023-06-28 ENCOUNTER — Ambulatory Visit: Payer: Federal, State, Local not specified - PPO | Admitting: Adult Health

## 2023-06-28 VITALS — BP 137/87 | HR 79 | Ht 72.0 in | Wt 227.0 lb

## 2023-06-28 DIAGNOSIS — R569 Unspecified convulsions: Secondary | ICD-10-CM

## 2023-06-28 DIAGNOSIS — Z5181 Encounter for therapeutic drug level monitoring: Secondary | ICD-10-CM | POA: Diagnosis not present
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
## 2023-06-29 ENCOUNTER — Encounter: Payer: Self-pay | Admitting: *Deleted

## 2023-06-29 LAB — COMPREHENSIVE METABOLIC PANEL
ALT: 26 [IU]/L (ref 0–44)
AST: 26 [IU]/L (ref 0–40)
Albumin: 4.7 g/dL (ref 4.1–5.1)
Alkaline Phosphatase: 114 [IU]/L (ref 44–121)
BUN/Creatinine Ratio: 8 — ABNORMAL LOW (ref 9–20)
BUN: 9 mg/dL (ref 6–20)
Bilirubin Total: <0.2 mg/dL (ref 0.0–1.2)
CO2: 29 mmol/L (ref 20–29)
Calcium: 9.5 mg/dL (ref 8.7–10.2)
Chloride: 102 mmol/L (ref 96–106)
Creatinine, Ser: 1.13 mg/dL (ref 0.76–1.27)
Globulin, Total: 2.8 g/dL (ref 1.5–4.5)
Glucose: 88 mg/dL (ref 70–99)
Potassium: 4.1 mmol/L (ref 3.5–5.2)
Sodium: 143 mmol/L (ref 134–144)
Total Protein: 7.5 g/dL (ref 6.0–8.5)
eGFR: 87 mL/min/{1.73_m2} (ref >59)

## 2023-06-29 LAB — CBC WITH DIFFERENTIAL/PLATELET
Basophils Absolute: 0 10*3/uL (ref 0.0–0.2)
Basos: 1 %
EOS (ABSOLUTE): 0.1 10*3/uL (ref 0.0–0.4)
Eos: 2 %
Hematocrit: 45.9 % (ref 37.5–51.0)
Hemoglobin: 14.9 g/dL (ref 13.0–17.7)
Immature Grans (Abs): 0 10*3/uL (ref 0.0–0.1)
Immature Granulocytes: 0 %
Lymphocytes Absolute: 1.6 10*3/uL (ref 0.7–3.1)
Lymphs: 39 %
MCH: 28.4 pg (ref 26.6–33.0)
MCHC: 32.5 g/dL (ref 31.5–35.7)
MCV: 88 fL (ref 79–97)
Monocytes Absolute: 0.5 10*3/uL (ref 0.1–0.9)
Monocytes: 13 %
Neutrophils Absolute: 1.9 10*3/uL (ref 1.4–7.0)
Neutrophils: 45 %
Platelets: 243 10*3/uL (ref 150–450)
RBC: 5.24 x10E6/uL (ref 4.14–5.80)
RDW: 12.3 % (ref 11.6–15.4)
WBC: 4.2 10*3/uL (ref 3.4–10.8)

## 2023-06-29 LAB — CARBAMAZEPINE LEVEL, TOTAL: Carbamazepine (Tegretol), S: 11.2 ug/mL (ref 4.0–12.0)

## 2023-07-11 ENCOUNTER — Ambulatory Visit: Payer: Federal, State, Local not specified - PPO

## 2023-07-11 NOTE — Progress Notes (Unsigned)
Pt here for Blood pressure check per Hyman Hopes, NP: 06/09/23 "Return in about 2 weeks (around 07/10/2023) for BP check with nurse. " Hypertension Elevated blood pressure noted during visit. -monitor at home BP at OV was: 147/92   Pt currently takes: No BP medication   BP today @ = HR =  Pt advised per Hyman Hopes:

## 2023-07-18 ENCOUNTER — Ambulatory Visit: Payer: Federal, State, Local not specified - PPO | Admitting: Adult Health

## 2023-07-19 ENCOUNTER — Ambulatory Visit: Payer: Federal, State, Local not specified - PPO

## 2023-08-07 ENCOUNTER — Ambulatory Visit: Payer: Federal, State, Local not specified - PPO | Admitting: Neurology

## 2023-08-07 DIAGNOSIS — R569 Unspecified convulsions: Secondary | ICD-10-CM

## 2023-08-14 ENCOUNTER — Telehealth: Payer: Self-pay | Admitting: Adult Health

## 2023-08-14 NOTE — Telephone Encounter (Signed)
 I called the patient left a voicemail.  Asked that he call our office.

## 2023-08-15 NOTE — Telephone Encounter (Signed)
Pt returned phone call would like a call back. 

## 2023-08-16 NOTE — Telephone Encounter (Signed)
 I called the patient reviewed EEG results with the patient.  He reports that the increase in carbamazepine  has been helpful.  He states that he does feel better and has not had any seizure-like events.  He remains on carbamazepine  extended release 400 mg in the morning and 800 mg at night.  Remains on Keppra  1500 mg twice a day

## 2023-09-18 ENCOUNTER — Encounter: Payer: Self-pay | Admitting: Adult Health

## 2023-09-19 ENCOUNTER — Ambulatory Visit: Payer: Federal, State, Local not specified - PPO | Admitting: Family Medicine

## 2023-09-20 ENCOUNTER — Encounter: Payer: Self-pay | Admitting: Family Medicine

## 2023-09-20 ENCOUNTER — Ambulatory Visit: Payer: Federal, State, Local not specified - PPO | Admitting: Family Medicine

## 2023-09-20 VITALS — BP 134/84 | HR 90 | Ht 72.0 in | Wt 233.0 lb

## 2023-09-20 DIAGNOSIS — F419 Anxiety disorder, unspecified: Secondary | ICD-10-CM | POA: Diagnosis not present

## 2023-09-20 DIAGNOSIS — F32A Depression, unspecified: Secondary | ICD-10-CM

## 2023-09-20 MED ORDER — SERTRALINE HCL 50 MG PO TABS: 50.0000 mg | ORAL_TABLET | Freq: Every day | ORAL | 3 refills | Status: DC

## 2023-09-20 NOTE — Addendum Note (Signed)
Addended by: Hyman Hopes B on: 09/20/2023 09:20 AM   Modules accepted: Orders
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
## 2023-09-20 NOTE — Patient Instructions (Signed)
Taking the medicine as directed and not missing any doses is one of the best things you can do to treat your anxiety/depression.  Here are some things to keep in mind: Side effects (stomach upset, some increased anxiety) may happen before you notice a benefit.  These side effects typically go away over time. Changes to your dose of medicine or a change in medication all together is sometimes necessary Many people will notice an improvement within two weeks but the full effect of the medication can take up to 4-6 weeks Stopping the medication when you start feeling better often results in a return of symptoms. Most people need to be on medication at least 6-12 months If you start having thoughts of hurting yourself or others after starting this medicine, please call me immediately.

## 2023-09-29 ENCOUNTER — Telehealth: Payer: Self-pay | Admitting: Family Medicine

## 2023-09-29 NOTE — Telephone Encounter (Signed)
Forms completed, copy sent to scan, copy to hold.  LVM letting patient know forms complete. Faxed to number provided with confirmation received. Copy at the front for pick up.

## 2023-09-29 NOTE — Telephone Encounter (Signed)
Pt dropped off paperwork to be completed by pcp. Papers placed in tray in fo. Please call pt when papers have been completed and faxed so they can be picked up.

## 2023-09-29 NOTE — Telephone Encounter (Signed)
Copied from CRM [id number]. Topic: General - Other >> Sep 29, 2023  9:04 AM Truddie Crumble wrote: Reason for CRM: patient called stating he is bringing FMLA paperwork to the office this morning for the doctor to fill out

## 2023-09-29 NOTE — Telephone Encounter (Signed)
 Noted

## 2023-11-03 ENCOUNTER — Encounter: Payer: Self-pay | Admitting: Family Medicine

## 2023-11-03 ENCOUNTER — Ambulatory Visit: Payer: Federal, State, Local not specified - PPO | Admitting: Family Medicine

## 2023-11-03 DIAGNOSIS — F32A Depression, unspecified: Secondary | ICD-10-CM

## 2023-11-03 DIAGNOSIS — F419 Anxiety disorder, unspecified: Secondary | ICD-10-CM | POA: Diagnosis not present

## 2023-11-03 MED ORDER — SERTRALINE HCL 50 MG PO TABS: 100.0000 mg | ORAL_TABLET | Freq: Every day | ORAL | 3 refills | Status: DC

## 2023-11-03 NOTE — Progress Notes (Signed)
 Established Patient Office Visit  Subjective   Patient ID: Dakota Evans, male    DOB: 21-Mar-1986  Age: 39 y.o. MRN: [id number]  Chief Complaint  Patient presents with   Medical Management of Chronic Issues    HPI  Discussed the use of AI scribe software for clinical note transcription with the patient, who gave verbal consent to proceed.  History of Present Illness Dakota Evans is a 38 year old male with anxiety and depression who presents with ongoing work-related stress and medication management.  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 He feels caught between management and employees, leading to significant stress. Attempts to step down from his supervisory role to reduce stress were denied, leaving him feeling stuck and unsupported. He is concerned about the impact of this stress on his personal life, particularly his relationship with his wife.  He is currently taking Zoloft (sertraline) 50 mg daily for anxiety and depression. He reports some improvement in anxiety scores but no drastic changes. He experienced occasional side effects from Zoloft, including headaches, decreased engagement at home, and decreased libido. He also mentions falling asleep easily, sometimes on the couch, but generally sleeps through the night. He has not used hydroxyzine recently, which was prescribed as needed for anxiety.  He has access to an Pension scheme manager (EAP) counselor through work but feels the counseling is biased due to its association with his employer. He is scheduled to see a Tax adviser for further eval/management on April 22nd.  No thoughts of self-harm but describes feeling defensive and needing to protect himself at work. He feels uncertain about who to trust among his coworkers, which contributes to his stress.         11/03/2023    8:31 AM 09/20/2023    8:41 AM  06/26/2023    9:07 AM  PHQ9 SCORE ONLY  PHQ-9 Total Score 11 8 12       11/03/2023    8:32 AM 09/20/2023    8:43 AM 06/26/2023    9:07 AM 05/26/2023    9:36 AM  GAD 7 : Generalized Anxiety Score  Nervous, Anxious, on Edge 2 3 3 1   Control/stop worrying 2 2 2 2   Worry too much - different things 2 2 2 2   Trouble relaxing 2 2 2 2   Restless 1 2 1 1   Easily annoyed or irritable 2 2 2 2   Afraid - awful might happen 2 2 1 2   Total GAD 7 Score 13 15 13 12   Anxiety Difficulty Very difficult Very difficult Somewhat difficult         ROS All review of systems negative except what is listed in the HPI    Objective:     BP 129/75   Pulse 76   Ht 6' (1.829 m)   Wt 236 lb (107 kg)   SpO2 99%   BMI 32.01 kg/m    Physical Exam Vitals reviewed.  Constitutional:      General: He is not in acute distress.    Appearance: Normal appearance. He is not ill-appearing.  Cardiovascular:     Rate and Rhythm: Normal rate and regular rhythm.  Pulmonary:     Effort: Pulmonary effort is normal.     Breath sounds: Normal breath sounds.  Neurological:     Mental Status: He is alert and oriented to person, place, and time.  Psychiatric:  Behavior: Behavior normal.          No results found for any visits on 11/03/23.    The ASCVD Risk score (Arnett DK, et al., 2019) failed to calculate for the following reasons:   The 2019 ASCVD risk score is only valid for ages 40 to 43    Assessment & Plan:   Problem List Items Addressed This Visit       Active Problems   Anxiety and depression   Ongoing anxiety with slight improvement. On Zoloft 50 mg. Work-related stress significant. Informed about Zoloft dosage effects and side effects. - Increase Zoloft to 100 mg daily, monitor side effects. - Provide handout with local non-work-related counseling options. - Encourage keeping BH appointment for medication management.      Relevant Medications   sertraline (ZOLOFT) 50 MG  tablet    Return if symptoms worsen or fail to improve.    Clayborne Dana, NP

## 2023-11-03 NOTE — Assessment & Plan Note (Signed)
 Ongoing anxiety with slight improvement. On Zoloft 50 mg. Work-related stress significant. Informed about Zoloft dosage effects and side effects. - Increase Zoloft to 100 mg daily, monitor side effects. - Provide handout with local non-work-related counseling options. - Encourage keeping BH appointment for medication management.

## 2023-11-19 ENCOUNTER — Encounter: Payer: Self-pay | Admitting: Adult Health

## 2023-11-19 ENCOUNTER — Encounter: Payer: Self-pay | Admitting: Family Medicine

## 2023-11-21 ENCOUNTER — Telehealth: Payer: Self-pay | Admitting: Family Medicine

## 2023-11-21 NOTE — Telephone Encounter (Signed)
 Pt dropped off [employer] forms to be filled out on 11/21/23 and pt will pick up call when ready [phone]. Put forms in [place] today. Thanks jsi

## 2023-11-22 NOTE — Telephone Encounter (Signed)
 Forms completed, copy sent to scan, copy to hold. Original placed at the front desk for patient pick up.

## 2023-11-22 NOTE — Telephone Encounter (Signed)
 Yes that's fine. We can look at his forms and fill them out best we can for quite/stable environment with remote options if possible.

## 2023-11-28 ENCOUNTER — Ambulatory Visit (HOSPITAL_BASED_OUTPATIENT_CLINIC_OR_DEPARTMENT_OTHER): Payer: Self-pay | Admitting: Family

## 2023-11-28 ENCOUNTER — Encounter (HOSPITAL_COMMUNITY): Payer: Self-pay | Admitting: Family

## 2023-11-28 ENCOUNTER — Other Ambulatory Visit: Payer: Self-pay

## 2023-11-28 VITALS — BP 132/85 | HR 83 | Ht 72.0 in | Wt 237.0 lb

## 2023-11-28 DIAGNOSIS — F32A Depression, unspecified: Secondary | ICD-10-CM | POA: Diagnosis not present

## 2023-11-28 DIAGNOSIS — F4310 Post-traumatic stress disorder, unspecified: Secondary | ICD-10-CM | POA: Diagnosis not present

## 2023-11-28 DIAGNOSIS — F419 Anxiety disorder, unspecified: Secondary | ICD-10-CM | POA: Diagnosis not present

## 2023-11-28 MED ORDER — SERTRALINE HCL 50 MG PO TABS: 50.0000 mg | ORAL_TABLET | Freq: Every day | ORAL | 0 refills | Status: AC

## 2023-11-28 NOTE — Telephone Encounter (Signed)
 done
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
## 2023-12-01 ENCOUNTER — Encounter: Payer: Self-pay | Admitting: Neurology

## 2023-12-01 ENCOUNTER — Ambulatory Visit: Admitting: Neurology

## 2023-12-01 DIAGNOSIS — G40109 Localization-related (focal) (partial) symptomatic epilepsy and epileptic syndromes with simple partial seizures, not intractable, without status epilepticus: Secondary | ICD-10-CM | POA: Diagnosis not present

## 2023-12-01 MED ORDER — LEVETIRACETAM 1000 MG PO TABS: 1000.0000 mg | ORAL_TABLET | Freq: Two times a day (BID) | ORAL | 3 refills | Status: DC

## 2023-12-01 MED ORDER — DIVALPROEX SODIUM 500 MG PO DR TAB: 500.0000 mg | DELAYED_RELEASE_TABLET | Freq: Two times a day (BID) | ORAL | 3 refills | Status: DC

## 2023-12-01 MED ORDER — CARBAMAZEPINE ER 400 MG PO TB12: 800.0000 mg | ORAL_TABLET | Freq: Two times a day (BID) | ORAL | 3 refills | Status: DC

## 2023-12-01 NOTE — Patient Instructions (Signed)
 Decrease levetiracetam  to 1000 mg twice daily due to side effect of irritability Increase carbamazepine  to 800 mg twice daily  Start Depakote 500 mg twice daily Please contact me if you do have another seizure Follow-up in couple months for lab work
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
## 2023-12-08 ENCOUNTER — Telehealth: Payer: Self-pay | Admitting: *Deleted

## 2023-12-08 DIAGNOSIS — G40109 Localization-related (focal) (partial) symptomatic epilepsy and epileptic syndromes with simple partial seizures, not intractable, without status epilepticus: Secondary | ICD-10-CM

## 2023-12-08 MED ORDER — CARBAMAZEPINE ER 400 MG PO TB12: 800.0000 mg | ORAL_TABLET | Freq: Two times a day (BID) | ORAL | 3 refills | Status: AC

## 2023-12-08 NOTE — Telephone Encounter (Signed)
 [facility] called stating that carbamazepine  400 mg has 2 set of directions on Rx. [facility] wanted to confirm how patient should be taking medication the Rx sent on 12/01/23 said " Patient Sig: Take 2 tablets (800 mg total) by mouth 2 (two) times daily. Take 1 tablet (400 mg) by mouth in the morning and 2 tablets (800 mg) at night   Per office note on 12/01/23 " Increase carbamazepine  to 800 mg twice daily"  [facility] asked if a new Rx could be sent. Rx sent with correct directions.

## 2024-01-02 ENCOUNTER — Ambulatory Visit (HOSPITAL_COMMUNITY): Admitting: Family

## 2024-01-09 ENCOUNTER — Ambulatory Visit (HOSPITAL_COMMUNITY): Admitting: Family

## 2024-01-30 ENCOUNTER — Ambulatory Visit: Admitting: Neurology

## 2024-01-30 ENCOUNTER — Ambulatory Visit: Payer: Federal, State, Local not specified - PPO | Admitting: Neurology

## 2024-01-30 ENCOUNTER — Encounter: Payer: Self-pay | Admitting: Neurology

## 2024-01-30 VITALS — BP 118/76 | Ht 72.0 in | Wt 239.0 lb

## 2024-01-30 DIAGNOSIS — Z5181 Encounter for therapeutic drug level monitoring: Secondary | ICD-10-CM

## 2024-01-30 DIAGNOSIS — G40109 Localization-related (focal) (partial) symptomatic epilepsy and epileptic syndromes with simple partial seizures, not intractable, without status epilepticus: Secondary | ICD-10-CM

## 2024-01-30 MED ORDER — DIVALPROEX SODIUM 500 MG PO DR TAB: 1000.0000 mg | DELAYED_RELEASE_TABLET | Freq: Two times a day (BID) | ORAL | 3 refills | Status: AC

## 2024-01-30 NOTE — Patient Instructions (Addendum)
 Discontinue levetiracetam  Increase Depakote  to 1000 mg twice daily, potential side effects include tremors, weight gain, and hair loss Continue with carbamazepine  extended release 800 mg twice daily Will check a Depakote  and carbamazepine  level with CMP Follow-up in 6 months or sooner if worse Please contact me if you do have any side effect from the medication, at that time we will consider switching Depakote  to clobazam.
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
## 2024-01-31 LAB — COMPREHENSIVE METABOLIC PANEL WITH GFR
ALT: 44 IU/L (ref 0–44)
AST: 44 IU/L — ABNORMAL HIGH (ref 0–40)
Albumin: 4.6 g/dL (ref 4.1–5.1)
Alkaline Phosphatase: 96 IU/L (ref 44–121)
BUN/Creatinine Ratio: 7 — ABNORMAL LOW (ref 9–20)
BUN: 8 mg/dL (ref 6–20)
Bilirubin Total: 0.2 mg/dL (ref 0.0–1.2)
CO2: 22 mmol/L (ref 20–29)
Calcium: 9.5 mg/dL (ref 8.7–10.2)
Chloride: 99 mmol/L (ref 96–106)
Creatinine, Ser: 1.12 mg/dL (ref 0.76–1.27)
Globulin, Total: 2.8 g/dL (ref 1.5–4.5)
Glucose: 98 mg/dL (ref 70–99)
Potassium: 3.8 mmol/L (ref 3.5–5.2)
Sodium: 140 mmol/L (ref 134–144)
Total Protein: 7.4 g/dL (ref 6.0–8.5)
eGFR: 87 mL/min/{1.73_m2} (ref >59)

## 2024-01-31 LAB — CARBAMAZEPINE LEVEL, TOTAL: Carbamazepine (Tegretol), S: 4.2 ug/mL (ref 4.0–12.0)

## 2024-01-31 LAB — VALPROIC ACID LEVEL: Valproic Acid Lvl: 60 ug/mL (ref 50–100)

## 2024-02-01 ENCOUNTER — Ambulatory Visit: Payer: Self-pay | Admitting: Neurology

## 2024-02-10 ENCOUNTER — Other Ambulatory Visit (HOSPITAL_COMMUNITY): Payer: Self-pay | Admitting: Family

## 2024-02-10 DIAGNOSIS — F419 Anxiety disorder, unspecified: Secondary | ICD-10-CM

## 2024-02-14 ENCOUNTER — Other Ambulatory Visit (HOSPITAL_COMMUNITY)
Admission: RE | Admit: 2024-02-14 | Discharge: 2024-02-14 | Disposition: A | Discharge status: Home / Self Care | Source: Ambulatory Visit | Attending: Family Medicine | Admitting: Family Medicine

## 2024-02-14 ENCOUNTER — Ambulatory Visit: Admitting: Family Medicine

## 2024-02-14 ENCOUNTER — Encounter: Payer: Self-pay | Admitting: Family Medicine

## 2024-02-14 VITALS — BP 138/87 | HR 77 | Ht 72.0 in | Wt 241.0 lb

## 2024-02-14 DIAGNOSIS — E785 Hyperlipidemia, unspecified: Secondary | ICD-10-CM | POA: Diagnosis not present

## 2024-02-14 DIAGNOSIS — Z113 Encounter for screening for infections with a predominantly sexual mode of transmission: Secondary | ICD-10-CM | POA: Diagnosis not present

## 2024-02-14 DIAGNOSIS — Z0000 Encounter for general adult medical examination without abnormal findings: Secondary | ICD-10-CM | POA: Diagnosis not present

## 2024-02-14 LAB — CBC WITH DIFFERENTIAL/PLATELET
Basophils Absolute: 0 K/uL (ref 0.0–0.1)
Basophils Relative: 0.5 % (ref 0.0–3.0)
Eosinophils Absolute: 0.1 K/uL (ref 0.0–0.7)
Eosinophils Relative: 2.1 % (ref 0.0–5.0)
HCT: 42.9 % (ref 39.0–52.0)
Hemoglobin: 14.1 g/dL (ref 13.0–17.0)
Lymphocytes Relative: 31.7 % (ref 12.0–46.0)
Lymphs Abs: 1.6 K/uL (ref 0.7–4.0)
MCHC: 32.9 g/dL (ref 30.0–36.0)
MCV: 87.5 fl (ref 78.0–100.0)
Monocytes Absolute: 0.6 K/uL (ref 0.1–1.0)
Monocytes Relative: 11.9 % (ref 3.0–12.0)
Neutro Abs: 2.7 K/uL (ref 1.4–7.7)
Neutrophils Relative %: 53.8 % (ref 43.0–77.0)
Platelets: 253 K/uL (ref 150.0–400.0)
RBC: 4.9 Mil/uL (ref 4.22–5.81)
RDW: 13.7 % (ref 11.5–15.5)
WBC: 5 K/uL (ref 4.0–10.5)

## 2024-02-14 LAB — LIPID PANEL
Cholesterol: 229 mg/dL — ABNORMAL HIGH (ref 0–200)
HDL: 37.2 mg/dL — ABNORMAL LOW (ref >39.00)
NonHDL: 191.81
Total CHOL/HDL Ratio: 6
Triglycerides: 479 mg/dL — ABNORMAL HIGH (ref 0.0–149.0)
VLDL: 95.8 mg/dL — ABNORMAL HIGH (ref 0.0–40.0)

## 2024-02-14 LAB — COMPREHENSIVE METABOLIC PANEL WITH GFR
ALT: 23 U/L (ref 0–53)
AST: 27 U/L (ref 0–37)
Albumin: 4.5 g/dL (ref 3.5–5.2)
Alkaline Phosphatase: 79 U/L (ref 39–117)
BUN: 10 mg/dL (ref 6–23)
CO2: 30 meq/L (ref 19–32)
Calcium: 9.4 mg/dL (ref 8.4–10.5)
Chloride: 101 meq/L (ref 96–112)
Creatinine, Ser: 1.15 mg/dL (ref 0.40–1.50)
GFR: 82.09 mL/min (ref >60.00)
Glucose, Bld: 97 mg/dL (ref 70–99)
Potassium: 3.6 meq/L (ref 3.5–5.1)
Sodium: 140 meq/L (ref 135–145)
Total Bilirubin: 0.3 mg/dL (ref 0.2–1.2)
Total Protein: 7.5 g/dL (ref 6.0–8.3)

## 2024-02-14 LAB — TSH: TSH: 1.83 u[IU]/mL (ref 0.35–5.50)

## 2024-02-14 LAB — LDL CHOLESTEROL, DIRECT: Direct LDL: 122 mg/dL

## 2024-02-14 NOTE — Progress Notes (Signed)
 Complete physical exam  Patient: Dakota Evans   DOB: October 01, 1985   38 y.o. Male  MRN: [id number]  Subjective:    Chief Complaint  Patient presents with   Medical Management of Chronic Issues    Dakota Evans is a 38 y.o. male who presents today for a complete physical exam. He reports consuming a general diet. The patient does not participate in regular exercise at present. He generally feels well. He reports sleeping well. He does not have additional problems to discuss today.   Currently lives with: wife and kids Acute concerns or interim problems since last visit: no  Vision concerns: no Dental concerns: no STD concerns: no, wants routine testing  ETOH use: no Nicotine use: no Recreational drugs/illegal substances: marijuana       Most recent fall risk assessment:    02/14/2024   10:26 AM  Fall Risk   Falls in the past year? 0  Number falls in past yr: 0  Injury with Fall? 0  Risk for fall due to : No Fall Risks  Follow up Falls evaluation completed     Most recent depression screenings:    02/14/2024   10:26 AM 11/28/2023    2:13 PM  PHQ 2/9 Scores  PHQ - 2 Score 3   PHQ- 9 Score 13      Information is confidential and restricted. Go to Review Flowsheets to unlock data.            Patient Care Team: Almarie Waddell NOVAK, NP as PCP - General (Family Medicine)   Outpatient Medications Prior to Visit  Medication Sig   carbamazepine  (TEGRETOL  XR) 400 MG 12 hr tablet Take 2 tablets (800 mg total) by mouth 2 (two) times daily.   divalproex  (DEPAKOTE ) 500 MG DR tablet Take 2 tablets (1,000 mg total) by mouth 2 (two) times daily.   hydrOXYzine  (ATARAX ) 10 MG tablet Take 1-2 tablets (10-20 mg total) by mouth 3 (three) times daily as needed. Start with bedtime use as this may make you drowsy.   sertraline  (ZOLOFT ) 50 MG tablet Take 1 tablet (50 mg total) by mouth daily.   No facility-administered medications prior to visit.    ROS All review of systems  negative except what is listed in the HPI        Objective:     BP 138/87   Pulse 77   Ht 6' (1.829 m)   Wt 241 lb (109.3 kg)   SpO2 98%   BMI 32.69 kg/m    Physical Exam Vitals reviewed.  Constitutional:      General: He is not in acute distress.    Appearance: Normal appearance. He is not ill-appearing.  HENT:     Head: Normocephalic and atraumatic.     Right Ear: Tympanic membrane normal.     Left Ear: Tympanic membrane normal.     Nose: Nose normal.     Mouth/Throat:     Mouth: Mucous membranes are moist.     Pharynx: Oropharynx is clear.  Eyes:     Extraocular Movements: Extraocular movements intact.     Conjunctiva/sclera: Conjunctivae normal.     Pupils: Pupils are equal, round, and reactive to light.  Cardiovascular:     Rate and Rhythm: Normal rate and regular rhythm.     Pulses: Normal pulses.     Heart sounds: Normal heart sounds.  Pulmonary:     Effort: Pulmonary effort is normal.     Breath sounds: Normal breath sounds.  Abdominal:     General: Abdomen is flat. Bowel sounds are normal. There is no distension.     Palpations: Abdomen is soft. There is no mass.     Tenderness: There is no abdominal tenderness. There is no right CVA tenderness, left CVA tenderness, guarding or rebound.  Genitourinary:    Comments: Deferred exam Musculoskeletal:        General: Normal range of motion.     Cervical back: Normal range of motion and neck supple. No tenderness.     Right lower leg: No edema.     Left lower leg: No edema.  Lymphadenopathy:     Cervical: No cervical adenopathy.  Skin:    General: Skin is warm and dry.     Capillary Refill: Capillary refill takes less than 2 seconds.  Neurological:     General: No focal deficit present.     Mental Status: He is alert and oriented to person, place, and time. Mental status is at baseline.  Psychiatric:        Mood and Affect: Mood normal.        Behavior: Behavior normal.        Thought Content: Thought  content normal.        Judgment: Judgment normal.         No results found for any visits on 02/14/24.     Assessment & Plan:    Routine Health Maintenance and Physical Exam Discussed health promotion and safety including diet and exercise recommendations, dental health, and injury prevention. Tobacco cessation if applicable. Seat belts, sunscreen, smoke detectors, etc.    Immunization History  Administered Date(s) Administered   Dtap, Unspecified 02/26/1988, 04/29/1988, 07/08/1988, 09/29/1989, 03/09/1993   HIB, Unspecified 09/29/1989   Hep A / Hep B 09/27/2008, 11/05/2008   Hep B, Unspecified 05/25/1999, 07/12/1999, 11/04/1999   IPV 09/27/2008   MMR 03/29/1989, 03/09/1993   Meningococcal Conjugate 11/14/2005, 09/27/2008   Novel Infuenza-h1n1-09 09/27/2008   PFIZER(Purple Top)SARS-COV-2 Vaccination 11/15/2019, 12/10/2019   Pneumococcal Polysaccharide-23 12/24/2013   Polio, Unspecified 02/26/1988, 04/29/1988, 07/08/1988, 09/29/1989, 03/09/1993   Tdap 09/27/2008   Tetanus 01/07/2011    Health Maintenance  Topic Date Due   HPV VACCINES (1 - 3-dose SCDM series) Never done   COVID-19 Vaccine (3 - 2024-25 season) 03/01/2024 (Originally 04/09/2023)   DTaP/Tdap/Td (8 - Td or Tdap) 06/22/2024 (Originally 01/06/2021)   Hepatitis B Vaccines (3 of 3 - 19+ 3-dose series) 02/13/2025 (Originally 03/27/2009)   INFLUENZA VACCINE  03/08/2024   Hepatitis C Screening  Completed   HIV Screening  Completed   Pneumococcal Vaccine 10-62 Years old  Aged Out   Meningococcal B Vaccine  Aged Out        Problem List Items Addressed This Visit   None Visit Diagnoses       Annual physical exam    -  Primary   Relevant Orders   CBC with Differential/Platelet   Comprehensive metabolic panel with GFR   Lipid panel   TSH     Screen for STD (sexually transmitted disease)       Relevant Orders   HIV Antibody (routine testing w rflx)   RPR   Urine cytology ancillary only   HSV(herpes simplex  vrs) 1+2 ab-IgG     Hyperlipidemia, unspecified hyperlipidemia type       Relevant Orders   Comprehensive metabolic panel with GFR   Lipid panel          PATIENT COUNSELING:    Encouraged smoking  cessation.   Recommend that most people either abstain from alcohol or drink within safe limits (<=14/week and <=4 drinks/occasion for males, <=7/weeks and <= 3 drinks/occasion for females) and that the risk for alcohol disorders and other health effects rises proportionally with the number of drinks per week and how often a drinker exceeds daily limits.   Diet: Recommend to adjust caloric intake to maintain or achieve ideal body weight, to reduce intake of dietary saturated fat and total fat, to limit sodium intake by avoiding high sodium foods and not adding table salt, and to maintain adequate dietary potassium and calcium  preferably from fresh fruits, vegetables, and low-fat dairy products.   Emphasized the importance of regular exercise.  Injury prevention: Recommend seatbelts, safety helmets, smoke detector, etc..   Dental health: Recommend regular tooth brushing, flossing, and dental visits.       Return in about 1 year (around 02/13/2025) for physical.     Waddell KATHEE Mon, NP

## 2024-02-15 LAB — HIV ANTIBODY (ROUTINE TESTING W REFLEX): HIV 1&2 Ab, 4th Generation: NONREACTIVE

## 2024-02-15 LAB — HSV(HERPES SIMPLEX VRS) I + II AB-IGG
HSV 1 IGG,TYPE SPECIFIC AB: 18 {index} — ABNORMAL HIGH
HSV 2 IGG,TYPE SPECIFIC AB: 21.5 {index} — ABNORMAL HIGH

## 2024-02-15 LAB — RPR: RPR Ser Ql: NONREACTIVE

## 2024-02-16 ENCOUNTER — Ambulatory Visit: Payer: Self-pay | Admitting: Family Medicine

## 2024-02-16 LAB — URINE CYTOLOGY ANCILLARY ONLY
Chlamydia: NEGATIVE
Comment: NEGATIVE
Comment: NEGATIVE
Comment: NORMAL
Neisseria Gonorrhea: NEGATIVE
Trichomonas: NEGATIVE

## 2024-05-08 ENCOUNTER — Encounter: Payer: Self-pay | Admitting: Family Medicine

## 2024-05-08 DIAGNOSIS — Z3009 Encounter for other general counseling and advice on contraception: Secondary | ICD-10-CM

## 2024-05-14 ENCOUNTER — Other Ambulatory Visit: Payer: Self-pay

## 2024-05-14 ENCOUNTER — Encounter (HOSPITAL_BASED_OUTPATIENT_CLINIC_OR_DEPARTMENT_OTHER): Payer: Self-pay | Admitting: Emergency Medicine

## 2024-05-14 ENCOUNTER — Emergency Department (HOSPITAL_BASED_OUTPATIENT_CLINIC_OR_DEPARTMENT_OTHER)
Admission: EM | Admit: 2024-05-14 | Discharge: 2024-05-14 | Discharge status: Against Medical Advice | Attending: Emergency Medicine | Admitting: Emergency Medicine

## 2024-05-14 DIAGNOSIS — K625 Hemorrhage of anus and rectum: Secondary | ICD-10-CM | POA: Diagnosis not present

## 2024-05-14 DIAGNOSIS — Z5321 Procedure and treatment not carried out due to patient leaving prior to being seen by health care provider: Secondary | ICD-10-CM | POA: Diagnosis not present

## 2024-05-14 LAB — ABO/RH: ABO/RH(D): O POS

## 2024-05-14 LAB — CBC
HCT: 42.8 % (ref 39.0–52.0)
Hemoglobin: 14.2 g/dL (ref 13.0–17.0)
MCH: 28.5 pg (ref 26.0–34.0)
MCHC: 33.2 g/dL (ref 30.0–36.0)
MCV: 85.9 fL (ref 80.0–100.0)
Platelets: 240 K/uL (ref 150–400)
RBC: 4.98 MIL/uL (ref 4.22–5.81)
RDW: 13 % (ref 11.5–15.5)
WBC: 5 K/uL (ref 4.0–10.5)
nRBC: 0 % (ref 0.0–0.2)

## 2024-05-14 LAB — COMPREHENSIVE METABOLIC PANEL WITH GFR
ALT: 26 U/L (ref 0–44)
AST: 25 U/L (ref 15–41)
Albumin: 4.5 g/dL (ref 3.5–5.0)
Alkaline Phosphatase: 84 U/L (ref 38–126)
Anion gap: 11 (ref 5–15)
BUN: 9 mg/dL (ref 6–20)
CO2: 26 mmol/L (ref 22–32)
Calcium: 9.3 mg/dL (ref 8.9–10.3)
Chloride: 103 mmol/L (ref 98–111)
Creatinine, Ser: 1.12 mg/dL (ref 0.61–1.24)
GFR, Estimated: >60 mL/min (ref >60)
Glucose, Bld: 88 mg/dL (ref 70–99)
Potassium: 3.8 mmol/L (ref 3.5–5.1)
Sodium: 140 mmol/L (ref 135–145)
Total Bilirubin: <0.2 mg/dL (ref 0.0–1.2)
Total Protein: 7.8 g/dL (ref 6.5–8.1)

## 2024-05-14 NOTE — ED Notes (Signed)
 Patient informed registration they were leaving. Noted to leave the department

## 2024-05-14 NOTE — ED Triage Notes (Signed)
 Pt reports internal bleeding since yesterday. Reports bloody BM yesterday and today, reports multiple incidences. Intermittent abd pain, denies at time of triage.   Denies weakness, dizziness, shob. Denies emesis.   Hx of hemorrhoids and blood transfusion.

## 2024-05-21 ENCOUNTER — Ambulatory Visit: Admitting: Family Medicine

## 2024-05-31 ENCOUNTER — Ambulatory Visit: Admitting: Family Medicine

## 2024-06-11 ENCOUNTER — Ambulatory Visit: Admitting: Urology

## 2024-06-11 ENCOUNTER — Encounter: Payer: Self-pay | Admitting: Urology

## 2024-06-11 VITALS — BP 148/91 | HR 87 | Ht 72.0 in | Wt 235.0 lb

## 2024-06-11 DIAGNOSIS — Z3009 Encounter for other general counseling and advice on contraception: Secondary | ICD-10-CM

## 2024-06-11 MED ORDER — ALPRAZOLAM 1 MG PO TABS: ORAL_TABLET | ORAL | 0 refills | Status: DC

## 2024-06-11 NOTE — Patient Instructions (Signed)
 Vasectomy is a safe, simple, and effective office based procedure that provides men with permanent sterility.  The no scalpel technique has been a refinement but often results in less swelling and pain than the traditional vasectomy method.  Prior to a vasectomy, it is important to make a decision that you are interested in permanent sterility and that you and your partner must be completely sure that you do not want children in the future.  To prepare for the procedure, stop taking any aspirin or blood thinners for 1 week prior to the procedure.  The day of your procedure take a shower and thoroughly clean your scrotum.  It is not necessary to shave prior to the procedure as any shaving that is needed will be performed in the office.  Bring a pair of tight underwear such as briefs, boxer briefs, or athletic shorts with you for the day of the procedure.  You may eat a light meal prior to the procedure.  During the procedure, the scrotal skin will be sterilized completely.  Anesthesia will be provided by injection of a local anesthetic into the scrotum which will provide anesthesia for 2-3 hours after the procedure.  Once the local anesthetic takes effect, a tiny puncture is made in the middle of the front of the scrotum through which both vasa deferens tubes can be partially removed and the ends of the tubes cauterized.  A small absorbable suture is placed in the skin incision.  Antibiotic ointment and sterile gauze dressings are applied and are held in place with the undergarment.  Prescriptions for an antibiotic and pain medication will be sent to your pharmacy.  The antibiotic should be taken to completion.  The pain medication can be taken as needed every 4-6 hours.  If a narcotic pain medication is too strong, over-the-counter analgesics such as Tylenol or ibuprofen may be taken instead.  After the procedure, it is important to take it easy for a couple of days and apply the ice pack to the scrotal area.   The ice pack goes on top of the undergarment and should be used for 10-15 minutes at a time while you are awake.  After the first 2 days, you can gradually increase your activity.  During the first week, it is important to avoid strenuous activity or activity that puts pressure in the scrotal area.  It is also advisable to restrain from heavy lifting or long distance running during this time period.  Often, supportive underwear is helpful to reduce pain during the first week.  You should also avoid sexual activity for 10 days.  You can resume normal activities after 1 week and sexual relations in 10 days.  One of the most important considerations after vasectomy is that you are still fertile after the procedure.  It generally takes at least 20 ejaculations and 3 months time before you are considered sterile.  Even 3 months after the procedure, some men will have a few persistent sperm present.  To be considered sterile, you will need to produce a semen sample that shows no sperm.  You will receive instructions to bring in your first semen specimen to the office 3 months after the procedure.  It is very important that you continue to use your current method of birth control during this time as it is possible to achieve a pregnancy until you become sterile.  Potential complications of vasectomy include bleeding (less than 1% risk of serious bleeding), infection (less than 1%), reconnection of  the vas deferens (08/998 chance), pregnancy (08/1998 chance), shrinkage of the testicle (08/4998 chance), and chronic pain (2-4%).  Other potential consequences of vasectomy include sperm granuloma (a small round area of scar tissue in the area of the procedure is performed), and congestion of the epididymis (a fullness of the tubes where sperm are stored).  Sperm will continue to be produced by the testicles, but the sperm will eventually die and be absorbed by the body.  The amount of semen that is produced will not change.   The main difference is that the semen will not contain sperm after a man has become sterile.  The procedure does not affect your urination, sex drive, or erections.  In summary, vasectomy provides permanent birth control for men.  It is an office-based procedure which is safe, effective, and economical.  After the procedure, it takes time to become sterile so proper precautions must be taken until sterility is achieved.    Taking care of yourself after a VASECTOMY                                              Patient Information Sheet        The following information will reinforce some of the instructions that your doctor has given you.  Day of Procedure: 1) Wear the scrotal supporter and gauze pad 2) Use an ice pack on the scrotum for 15 minutes every hour for 48 hours to help reduce discomfort, swelling and bruising (do NOT place ice directly on your skin, but place on top of the supporter) 3) Expect some clear to pinkish drainage at the surgical site for the first 24-48 hours 4) If needed, use pain medications provided or ibuprofen 800 mg every 8 hours for discomfort 5) Avoid strenuous activities like mowing, lifting, jogging and exercising for 1 week.  Take it easy! 6) If you develop a fever over 101 F or sudden onset of significant swelling within the first 12 hours, please call to report this to your doctor as soon as possible.     Day Two and Three: 1) You may take a shower, but avoid tubs, pools or hot tubs. 2) Continue to wear the scrotal supporter as needed for comfort and change or remove the gauze pad if desired 3) Keep taking it easy!  Avoid strenuous activities like mowing, lifting, jogging and exercising.   4) Continue to watch for signs or symptoms of fever or significant swelling 5) Apply a small amount of antibiotic ointment to incision 1-2 times/day  The rest of the week: 1) Gradually return to normal physical activities after one week.  A return of soreness might  mean you are        "doing too much too soon". 2) Avoid sexual activity for 10 days after the procedure 3) Continue to take a shower, but avoid tubs, pools or hot tubs 4) Wearing the scrotal supporter is optional based on your comfort.     Remember to use an alternate form of contraception for 3 months until you have been checked and CLEARED by your urologist!  62-Month lab appointment:  1) The lab technician will need to look at a semen sample under a microscope  2) Use the specimen cup provided to collect the sample AT HOME 1 hour before the appointment  3) DO NOT refrigerate the specimen, but keep  at room or body temperature  4) Avoid ejaculation for 2-5 days before collecting the specimen  5) Collect the entire specimen by masturbation using NO lubricant  6) Make sure your name, MR number, date and time of collection are on the cup
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
## 2024-07-08 ENCOUNTER — Encounter: Payer: Self-pay | Admitting: Urology

## 2024-07-08 ENCOUNTER — Ambulatory Visit (INDEPENDENT_AMBULATORY_CARE_PROVIDER_SITE_OTHER): Admitting: Urology

## 2024-07-08 VITALS — BP 160/84 | HR 96 | Ht 72.0 in | Wt 230.0 lb

## 2024-07-08 DIAGNOSIS — Z302 Encounter for sterilization: Secondary | ICD-10-CM

## 2024-07-08 MED ORDER — CEPHALEXIN 500 MG PO CAPS: 500.0000 mg | ORAL_CAPSULE | Freq: Three times a day (TID) | ORAL | 0 refills | Status: AC

## 2024-07-08 MED ORDER — HYDROCODONE-ACETAMINOPHEN 5-325 MG PO TABS: 1.0000 | ORAL_TABLET | Freq: Four times a day (QID) | ORAL | 0 refills | Status: AC | PRN
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
## 2024-07-08 NOTE — Patient Instructions (Signed)
 Taking care of yourself after a VASECTOMY                                              Patient Information Sheet        The following information will reinforce some of the instructions that your doctor has given you.  Day of Procedure: 1) Wear the scrotal supporter and gauze pad 2) Use an ice pack on the scrotum for 15 minutes every hour for 48 hours to help reduce discomfort, swelling and bruising (do NOT place ice directly on your skin, but place on top of the supporter) 3) Expect some clear to pinkish drainage at the surgical site for the first 24-48 hours 4) If needed, use pain medications provided or ibuprofen 800 mg every 8 hours for discomfort 5) Avoid strenuous activities like mowing, lifting, jogging and exercising for 1 week.  Take it easy! 6) If you develop a fever over 101 F or sudden onset of significant swelling within the first 12 hours, please call to report this to your doctor as soon as possible.     Day Two and Three: 1) You may take a shower, but avoid tubs, pools or hot tubs. 2) Continue to wear the scrotal supporter as needed for comfort and change or remove the gauze pad if desired 3) Keep taking it easy!  Avoid strenuous activities like mowing, lifting, jogging and exercising.   4) Continue to watch for signs or symptoms of fever or significant swelling 5) Apply a small amount of antibiotic ointment to incision 1-2 times/day  The rest of the week: 1) Gradually return to normal physical activities after one week.  A return of soreness might mean you are        "doing too much too soon". 2) Avoid sexual activity for 10 days after the procedure 3) Continue to take a shower, but avoid tubs, pools or hot tubs 4) Wearing the scrotal supporter is optional based on your comfort.     Remember to use an alternate form of contraception for 3 months until you have been checked and CLEARED by your urologist!  48-Month lab appointment:  1) The lab technician will need to  look at a semen sample under a microscope  2) Use the specimen cup provided to collect the sample AT HOME 1 hour before the appointment  3) DO NOT refrigerate the specimen, but keep at room or body temperature  4) Avoid ejaculation for 2-5 days before collecting the specimen  5) Collect the entire specimen by masturbation using NO lubricant  6) Make sure your name, MR number, date and time of collection are on the cup

## 2024-07-09 ENCOUNTER — Encounter: Admitting: Urology

## 2024-09-18 ENCOUNTER — Ambulatory Visit: Admitting: Neurology

## 2024-10-07 ENCOUNTER — Other Ambulatory Visit
